# Patient Record
Sex: Male | Born: 1935 | Race: White | Hispanic: No | Marital: Married | Smoking: Former smoker
Health system: Southern US, Community
[De-identification: ages and names within clinical notes are randomized; demographics above are authoritative.]

## PROBLEM LIST (undated history)

## (undated) DIAGNOSIS — N400 Enlarged prostate without lower urinary tract symptoms: Secondary | ICD-10-CM

## (undated) DIAGNOSIS — K219 Gastro-esophageal reflux disease without esophagitis: Secondary | ICD-10-CM

## (undated) DIAGNOSIS — G20A1 Parkinson's disease without dyskinesia, without mention of fluctuations: Secondary | ICD-10-CM

## (undated) DIAGNOSIS — N189 Chronic kidney disease, unspecified: Secondary | ICD-10-CM

## (undated) DIAGNOSIS — J449 Chronic obstructive pulmonary disease, unspecified: Secondary | ICD-10-CM

## (undated) DIAGNOSIS — R42 Dizziness and giddiness: Secondary | ICD-10-CM

## (undated) DIAGNOSIS — E119 Type 2 diabetes mellitus without complications: Secondary | ICD-10-CM

## (undated) DIAGNOSIS — I219 Acute myocardial infarction, unspecified: Secondary | ICD-10-CM

## (undated) DIAGNOSIS — Z9981 Dependence on supplemental oxygen: Secondary | ICD-10-CM

## (undated) DIAGNOSIS — I10 Essential (primary) hypertension: Secondary | ICD-10-CM

## (undated) DIAGNOSIS — E039 Hypothyroidism, unspecified: Secondary | ICD-10-CM

## (undated) DIAGNOSIS — I639 Cerebral infarction, unspecified: Secondary | ICD-10-CM

## (undated) DIAGNOSIS — G20 Parkinson's disease: Secondary | ICD-10-CM

## (undated) DIAGNOSIS — I509 Heart failure, unspecified: Secondary | ICD-10-CM

## (undated) DIAGNOSIS — F419 Anxiety disorder, unspecified: Secondary | ICD-10-CM

## (undated) DIAGNOSIS — M1990 Unspecified osteoarthritis, unspecified site: Secondary | ICD-10-CM

## (undated) DIAGNOSIS — I739 Peripheral vascular disease, unspecified: Secondary | ICD-10-CM

## (undated) DIAGNOSIS — F329 Major depressive disorder, single episode, unspecified: Secondary | ICD-10-CM

## (undated) DIAGNOSIS — K5900 Constipation, unspecified: Secondary | ICD-10-CM

## (undated) DIAGNOSIS — M869 Osteomyelitis, unspecified: Secondary | ICD-10-CM

## (undated) DIAGNOSIS — F32A Depression, unspecified: Secondary | ICD-10-CM

## (undated) HISTORY — PX: THYROID SURGERY: SHX805

## (undated) HISTORY — PX: ROTATOR CUFF REPAIR: SHX139

---

## 2005-05-29 ENCOUNTER — Ambulatory Visit: Payer: Self-pay | Admitting: *Deleted

## 2005-06-17 ENCOUNTER — Ambulatory Visit: Payer: Self-pay | Admitting: *Deleted

## 2005-07-17 ENCOUNTER — Ambulatory Visit: Payer: Self-pay | Admitting: *Deleted

## 2005-08-17 ENCOUNTER — Ambulatory Visit: Payer: Self-pay | Admitting: *Deleted

## 2011-08-01 ENCOUNTER — Ambulatory Visit: Payer: Self-pay | Admitting: Family Medicine

## 2012-08-10 ENCOUNTER — Emergency Department: Payer: Self-pay | Admitting: Emergency Medicine

## 2013-02-28 ENCOUNTER — Emergency Department: Payer: Self-pay | Admitting: Internal Medicine

## 2013-02-28 LAB — COMPREHENSIVE METABOLIC PANEL
Albumin: 3.7 g/dL (ref 3.4–5.0)
Alkaline Phosphatase: 65 U/L
Chloride: 107 mmol/L (ref 98–107)
Co2: 32 mmol/L (ref 21–32)
Creatinine: 1.29 mg/dL (ref 0.60–1.30)
Glucose: 82 mg/dL (ref 65–99)
Osmolality: 286 (ref 275–301)
Potassium: 5 mmol/L (ref 3.5–5.1)
SGOT(AST): 18 U/L (ref 15–37)
Total Protein: 7.3 g/dL (ref 6.4–8.2)

## 2013-02-28 LAB — CBC WITH DIFFERENTIAL/PLATELET
Basophil #: 0.1 10*3/uL (ref 0.0–0.1)
Basophil %: 0.6 %
HCT: 45.2 % (ref 40.0–52.0)
HGB: 15.3 g/dL (ref 13.0–18.0)
Lymphocyte #: 1.8 10*3/uL (ref 1.0–3.6)
Lymphocyte %: 16.4 %
MCH: 29.4 pg (ref 26.0–34.0)
Monocyte %: 7.9 %
Neutrophil #: 8 10*3/uL — ABNORMAL HIGH (ref 1.4–6.5)
Platelet: 115 10*3/uL — ABNORMAL LOW (ref 150–440)
RDW: 13.5 % (ref 11.5–14.5)
WBC: 10.8 10*3/uL — ABNORMAL HIGH (ref 3.8–10.6)

## 2014-08-15 ENCOUNTER — Emergency Department
Admission: EM | Admit: 2014-08-15 | Discharge: 2014-08-15 | Disposition: A | Discharge status: Home / Self Care | Payer: Medicare Other | Attending: Emergency Medicine | Admitting: Emergency Medicine

## 2014-08-15 DIAGNOSIS — K5900 Constipation, unspecified: Secondary | ICD-10-CM | POA: Diagnosis not present

## 2014-08-15 MED ORDER — MAGNESIUM CITRATE PO SOLN: 1.0000 | Freq: Once | ORAL | Status: DC

## 2014-08-15 NOTE — ED Provider Notes (Signed)
[facility] Emergency Department Provider Note  ____________________________________________  Time seen: 0755  I have reviewed the triage vital signs and the nursing notes.   HISTORY  Chief Complaint Constipation   History limited by: Not Limited   HPI Jason [name] is a 81 y.o. male who presents to the emergency department today because of concerns for constipation. Patient states he has had 3 days without a bowel movement. Patient states that he feels like he needs to go but cannot. States this has happened to him in the past that usually resolves with a soapsuds enema. Family reports that the patient has been prescribed MiraLAX as well as fiber supplements but takes these irregularly. Patient describes a mild amount of additional abdominal discomfort however denies any nausea or vomiting. Denies any fevers.     No past medical history on file.  There are no active problems to display for this patient.   No past surgical history on file.  Current Outpatient Rx  Name  Route  Sig  Dispense  Refill  . magnesium citrate SOLN   Oral   Take 296 mLs (1 Bottle total) by mouth once.   195 mL   0     Allergies Review of patient's allergies indicates not on file.  No family history on file.  Social History History  Substance Use Topics  . Smoking status: Not on file  . Smokeless tobacco: Not on file  . Alcohol Use: Not on file    Review of Systems  Constitutional: Negative for fever. Cardiovascular: Negative for chest pain. Respiratory: Negative for shortness of breath. Gastrointestinal: Negative for abdominal pain, vomiting and diarrhea. Genitourinary: Negative for dysuria. Musculoskeletal: Negative for back pain. Skin: Negative for rash. Neurological: Negative for headaches, focal weakness or numbness.   10-point ROS otherwise negative.  ____________________________________________   PHYSICAL EXAM:  VITAL SIGNS: ED Triage  Vitals  Enc Vitals Group     BP 08/15/14 0659 132/47 mmHg     Pulse Rate 08/15/14 0659 101     Resp 08/15/14 0659 18     Temp 08/15/14 0659 97.9 F (36.6 C)     Temp Source 08/15/14 0659 Oral     SpO2 08/15/14 0659 98 %     Weight 08/15/14 0659 285 lb (129.275 kg)     Height 08/15/14 0659 5\' 9"  (1.753 m)     Head Cir --      Peak Flow --      Pain Score 08/15/14 0659 0   Constitutional: Alert and oriented. Well appearing and in no distress. Eyes: Conjunctivae are normal. PERRL. Normal extraocular movements. ENT   Head: Normocephalic and atraumatic.   Nose: No congestion/rhinnorhea.   Mouth/Throat: Mucous membranes are moist.   Neck: No stridor. Hematological/Lymphatic/Immunilogical: No cervical lymphadenopathy. Cardiovascular: Normal rate, regular rhythm.  No murmurs, rubs, or gallops. Respiratory: Normal respiratory effort without tachypnea nor retractions. Breath sounds are clear and equal bilaterally. No wheezes/rales/rhonchi. Gastrointestinal: Soft and nontender. No distention.  Rectal: Non tender, some stool in vault, no obvious impaction Genitourinary: Deferred Musculoskeletal: Normal range of motion in all extremities. No joint effusions.  No lower extremity tenderness nor edema. Neurologic:  Normal speech and language. No gross focal neurologic deficits are appreciated. Speech is normal.  Skin:  Skin is warm, dry and intact. No rash noted. Psychiatric: Mood and affect are normal. Speech and behavior are normal. Patient exhibits appropriate insight and judgment.  ____________________________________________    LABS (pertinent positives/negatives)  None  ____________________________________________   EKG  None  ____________________________________________    RADIOLOGY  None  ____________________________________________   PROCEDURES  Procedure(s) performed: None  Critical Care performed:  No  ____________________________________________   INITIAL IMPRESSION / ASSESSMENT AND PLAN / ED COURSE  Pertinent labs & imaging results that were available during my care of the patient were reviewed by me and considered in my medical decision making (see chart for details).  Patient here with concerns for constipation 3 days. Rectal exam without any obvious impaction. Will attempt soapsuds enema.  ----------------------------------------- 8:45 AM on 08/15/2014 -----------------------------------------  Soap suds enema has been performed with good amount of bowel discharge.Will plan on discharging patient home. Will give prescription for magnesium citrate. Have discussed with patient points to take daily MiraLAX or fiber supplements.  ____________________________________________   FINAL CLINICAL IMPRESSION(S) / ED DIAGNOSES  Final diagnoses:  Constipation, unspecified constipation type     Phineas Semen, MD 08/15/14 1610

## 2014-08-15 NOTE — ED Notes (Signed)
Pt says he has been unable to have a BM for "at least" 3 days; has been here before for the same and had to have soap suds enema; has spoken to his MD about this issue but his MD will not prescribe any medication for him; pt denies abd pain; says he needs to go and just cant

## 2014-08-15 NOTE — Discharge Instructions (Signed)
As we discussed it is important that you start taking MiraLAX daily or fiber supplements daily. It is important that you do do this daily and not just when you start developing symptoms.Please seek medical attention for any high fevers, chest pain, shortness of breath, change in behavior, persistent vomiting, bloody stool or any other new or concerning symptoms.  Constipation Constipation is when a person has fewer than three bowel movements a week, has difficulty having a bowel movement, or has stools that are dry, hard, or larger than normal. As people grow older, constipation is more common. If you try to fix constipation with medicines that make you have a bowel movement (laxatives), the problem may get worse. Long-term laxative use may cause the muscles of the colon to become weak. A low-fiber diet, not taking in enough fluids, and taking certain medicines may make constipation worse.  CAUSES   Certain medicines, such as antidepressants, pain medicine, iron supplements, antacids, and water pills.   Certain diseases, such as diabetes, irritable bowel syndrome (IBS), thyroid disease, or depression.   Not drinking enough water.   Not eating enough fiber-rich foods.   Stress or travel.   Lack of physical activity or exercise.   Ignoring the urge to have a bowel movement.   Using laxatives too much.  SIGNS AND SYMPTOMS   Having fewer than three bowel movements a week.   Straining to have a bowel movement.   Having stools that are hard, dry, or larger than normal.   Feeling full or bloated.   Pain in the lower abdomen.   Not feeling relief after having a bowel movement.  DIAGNOSIS  Your health care provider will take a medical history and perform a physical exam. Further testing may be done for severe constipation. Some tests may include:  A barium enema X-ray to examine your rectum, colon, and, sometimes, your small intestine.   A sigmoidoscopy to examine your  lower colon.   A colonoscopy to examine your entire colon. TREATMENT  Treatment will depend on the severity of your constipation and what is causing it. Some dietary treatments include drinking more fluids and eating more fiber-rich foods. Lifestyle treatments may include regular exercise. If these diet and lifestyle recommendations do not help, your health care provider may recommend taking over-the-counter laxative medicines to help you have bowel movements. Prescription medicines may be prescribed if over-the-counter medicines do not work.  HOME CARE INSTRUCTIONS   Eat foods that have a lot of fiber, such as fruits, vegetables, whole grains, and beans.  Limit foods high in fat and processed sugars, such as french fries, hamburgers, cookies, candies, and soda.   A fiber supplement may be added to your diet if you cannot get enough fiber from foods.   Drink enough fluids to keep your urine clear or pale yellow.   Exercise regularly or as directed by your health care provider.   Go to the restroom when you have the urge to go. Do not hold it.   Only take over-the-counter or prescription medicines as directed by your health care provider. Do not take other medicines for constipation without talking to your health care provider first.  SEEK IMMEDIATE MEDICAL CARE IF:   You have bright red blood in your stool.   Your constipation lasts for more than 4 days or gets worse.   You have abdominal or rectal pain.   You have thin, pencil-like stools.   You have unexplained weight loss. MAKE SURE YOU:  Understand these instructions.  Will watch your condition.  Will get help right away if you are not doing well or get worse. Document Released: 12/02/2003 Document Revised: 03/10/2013 Document Reviewed: 12/15/2012 [facility] Patient Information 2015 [place], [place]. This information is not intended to replace advice given to you by your health care provider. Make sure you discuss  any questions you have with your health care provider.

## 2014-09-04 ENCOUNTER — Emergency Department
Admission: EM | Admit: 2014-09-04 | Discharge: 2014-09-04 | Disposition: A | Discharge status: Home / Self Care | Payer: Medicare Other | Attending: Emergency Medicine | Admitting: Emergency Medicine

## 2014-09-04 ENCOUNTER — Encounter: Payer: Self-pay | Admitting: Emergency Medicine

## 2014-09-04 DIAGNOSIS — K5900 Constipation, unspecified: Secondary | ICD-10-CM | POA: Insufficient documentation

## 2014-09-04 DIAGNOSIS — Z79899 Other long term (current) drug therapy: Secondary | ICD-10-CM | POA: Diagnosis not present

## 2014-09-04 DIAGNOSIS — Z794 Long term (current) use of insulin: Secondary | ICD-10-CM | POA: Insufficient documentation

## 2014-09-04 DIAGNOSIS — Z7982 Long term (current) use of aspirin: Secondary | ICD-10-CM | POA: Diagnosis not present

## 2014-09-04 DIAGNOSIS — Z87891 Personal history of nicotine dependence: Secondary | ICD-10-CM | POA: Insufficient documentation

## 2014-09-04 DIAGNOSIS — E119 Type 2 diabetes mellitus without complications: Secondary | ICD-10-CM | POA: Diagnosis not present

## 2014-09-04 DIAGNOSIS — Z7951 Long term (current) use of inhaled steroids: Secondary | ICD-10-CM | POA: Diagnosis not present

## 2014-09-04 HISTORY — DX: Constipation, unspecified: K59.00

## 2014-09-04 HISTORY — DX: Chronic obstructive pulmonary disease, unspecified: J44.9

## 2014-09-04 HISTORY — DX: Type 2 diabetes mellitus without complications: E11.9

## 2014-09-04 MED ORDER — ONDANSETRON 4 MG PO TBDP
4.0000 mg | ORAL_TABLET | ORAL | Status: AC
  Administered 2014-09-04: 4 mg via ORAL

## 2014-09-04 MED ORDER — MAGNESIUM CITRATE PO SOLN
1.0000 | Freq: Once | ORAL | Status: AC
  Administered 2014-09-04: 1 via ORAL

## 2014-09-04 MED ORDER — MAGNESIUM CITRATE PO SOLN
ORAL | Status: AC
  Filled 2014-09-04: qty 296

## 2014-09-04 MED ORDER — ONDANSETRON 4 MG PO TBDP
ORAL_TABLET | ORAL | Status: AC
  Filled 2014-09-04: qty 1

## 2014-09-04 NOTE — ED Provider Notes (Signed)
[facility] Emergency Department Provider Note  ____________________________________________  Time seen: Approximately 3:36 PM  I have reviewed the triage vital signs and the nursing notes.   HISTORY  Chief Complaint Constipation    HPI Jason [name] is a 81 y.o. male history of constipation. Patient states that probably 5 or 6 times he has had issues with constipation. States he is here in May for the same. Takes MiraLAX twice a day, as well as stool softeners and despite this he is still develop constipation over the last 3 days. He is able to pass gas, but when he returns to have a bowel movement he feels he just can't get his bowels to move. He has a history of this. He denies being in pain. Just feels "constipated". Not a chest pain, fevers, chills. No nausea or vomiting. Continues to eat well.  Patient is requesting a soapsuds enema.   Past Medical History  Diagnosis Date  . Constipation   . Diabetes mellitus without complication   . COPD (chronic obstructive pulmonary disease)     There are no active problems to display for this patient.   Past Surgical History  Procedure Laterality Date  . Rotator cuff repair Left   . Thyroid surgery      Current Outpatient Rx  Name  Route  Sig  Dispense  Refill  . ACCU-CHEK COMPACT PLUS test strip   Topical   Apply 1 strip topically every morning.           Dispense as written.   Marland Kitchen ADVAIR DISKUS 250-50 MCG/DOSE AEPB   Inhalation   Inhale 1 puff into the lungs 2 (two) times daily.           Dispense as written.   Marland Kitchen aspirin 81 MG tablet   Oral   Take 81 mg by mouth daily.         . enalapril (VASOTEC) 20 MG tablet   Oral   Take 20 mg by mouth daily.         Marland Kitchen FLUoxetine (PROZAC) 40 MG capsule   Oral   Take 40 mg by mouth daily.         . furosemide (LASIX) 40 MG tablet   Oral   Take 80 mg by mouth every morning.         Marland Kitchen LANTUS SOLOSTAR 100 UNIT/ML Solostar Pen  Subcutaneous   Inject 90 Units into the skin 2 (two) times daily.           Dispense as written.   . magnesium citrate SOLN   Oral   Take 296 mLs (1 Bottle total) by mouth once.   195 mL   0   . omeprazole (PRILOSEC) 20 MG capsule   Oral   Take 20 mg by mouth 2 (two) times daily.         . simvastatin (ZOCOR) 20 MG tablet   Oral   Take 20 mg by mouth at bedtime.         Marland Kitchen SYNTHROID 125 MCG tablet   Oral   Take 125 mcg by mouth daily.           Dispense as written.   . tamsulosin (FLOMAX) 0.4 MG CAPS capsule   Oral   Take 0.4 mg by mouth daily.           Allergies Review of patient's allergies indicates no known allergies.  No family history on file.  Social History History  Substance  Use Topics  . Smoking status: Former Games developer  . Smokeless tobacco: Never Used  . Alcohol Use: No    Review of Systems Constitutional: No fever/chills Eyes: No visual changes. ENT: No sore throat. Cardiovascular: Denies chest pain. Respiratory: Denies shortness of breath. Gastrointestinal: No abdominal pain.  No nausea, no vomiting.  No diarrhea.  Feels quite constipated. Genitourinary: Negative for dysuria. Musculoskeletal: Negative for back pain. Skin: Negative for rash. Neurological: Negative for headaches, focal weakness or numbness.  10-point ROS otherwise negative.  ____________________________________________   PHYSICAL EXAM:  VITAL SIGNS: ED Triage Vitals  Enc Vitals Group     BP 09/04/14 1146 136/64 mmHg     Pulse Rate 09/04/14 1146 97     Resp 09/04/14 1146 18     Temp 09/04/14 1146 98 F (36.7 C)     Temp Source 09/04/14 1146 Oral     SpO2 09/04/14 1146 97 %     Weight 09/04/14 1146 285 lb (129.275 kg)     Height 09/04/14 1146 5' 9.5" (1.765 m)     Head Cir --      Peak Flow --      Pain Score 09/04/14 1147 0     Pain Loc --      Pain Edu? --      Excl. in GC? --     Constitutional: Alert and oriented. Well appearing and in no acute  distress. Eyes: Conjunctivae are normal. PERRL. EOMI. Head: Atraumatic. Nose: No congestion/rhinnorhea. Mouth/Throat: Mucous membranes are moist.  Oropharynx non-erythematous. Neck: No stridor.   Cardiovascular: Normal rate, regular rhythm. Grossly normal heart sounds.  Good peripheral circulation. Respiratory: Normal respiratory effort.  No retractions. Lungs CTAB. Gastrointestinal: Obese and somewhat protuberant. Soft and nontender. No distention. No abdominal bruits. No CVA tenderness. Musculoskeletal: No lower extremity tenderness nor edema.  No joint effusions. Neurologic:  Normal speech and language. No gross focal neurologic deficits are appreciated. Speech is normal. No gait instability. Skin:  Skin is warm, dry and intact. No rash noted. Psychiatric: Mood and affect are normal. Speech and behavior are normal.  ____________________________________________   LABS (all labs ordered are listed, but only abnormal results are displayed)  Labs Reviewed - No data to display ____________________________________________  EKG   ____________________________________________  RADIOLOGY   ____________________________________________   PROCEDURES  Procedure(s) performed: None  Critical Care performed: No  ____________________________________________   INITIAL IMPRESSION / ASSESSMENT AND PLAN / ED COURSE  Pertinent labs & imaging results that were available during my care of the patient were reviewed by me and considered in my medical decision making (see chart for details).  The patient presents concerns of constipation for the last 3 days. Does have a history of this. He is stable. He has no pain on exam. Does have a protuberant abdomen, but no other abnormalities on exam. Based on his history and previous history which she gives an examination this does seem consistent with constipation. We will treat him with a soapsuds enema and magnesium citrate and await result. Should  he not be able to pass his bowels, we can then consider obtaining imaging. At this point, however given his stability and the history he gives I do not feel the patient has clinically significant signs of bowel obstruction, or acute abdomen.  He sees a doctor at [facility] primary care whom is working with him on bowel regimens. He is currently changed to a high-fiber diet, using MiraLAX treatment only, as well as other stool softeners.  I  advised the patient and his wife that he needs to be seen by gastroenterologist. I informed him that this would be helpful, and he may need a colonoscopy should he have continuous symptoms to rule out causes such as a tumor or mass. ____________________________________________  ----------------------------------------- 6:10 PM on 09/04/2014 -----------------------------------------  Vision or a large bowel movement. Feels much improved. This point, I'll discharge the patient home with return precautions and recommendation to follow up with his primary care regarding constipation as well as GI.  Discharge to home Much improved. FINAL CLINICAL IMPRESSION(S) / ED DIAGNOSES  Final diagnoses:  Constipation, unspecified constipation type      Sharyn Creamer, MD 09/05/14 0008

## 2014-09-04 NOTE — ED Notes (Signed)
Patient to ER for constipation. Patient states he has to come to ER frequently for enemas, as Miralax does not seem to work for him. Has not had BM in 3 days.

## 2014-09-04 NOTE — Discharge Instructions (Signed)
Constipation  Return to the ER right away should you develop abdominal pain, vomiting, blood in your stool, dark stools, and unable to eat, have a fever, or other new concerns arise.  Constipation is when a person has fewer than three bowel movements a week, has difficulty having a bowel movement, or has stools that are dry, hard, or larger than normal. As people grow older, constipation is more common. If you try to fix constipation with medicines that make you have a bowel movement (laxatives), the problem may get worse. Long-term laxative use may cause the muscles of the colon to become weak. A low-fiber diet, not taking in enough fluids, and taking certain medicines may make constipation worse.  CAUSES   Certain medicines, such as antidepressants, pain medicine, iron supplements, antacids, and water pills.   Certain diseases, such as diabetes, irritable bowel syndrome (IBS), thyroid disease, or depression.   Not drinking enough water.   Not eating enough fiber-rich foods.   Stress or travel.   Lack of physical activity or exercise.   Ignoring the urge to have a bowel movement.   Using laxatives too much.  SIGNS AND SYMPTOMS   Having fewer than three bowel movements a week.   Straining to have a bowel movement.   Having stools that are hard, dry, or larger than normal.   Feeling full or bloated.   Pain in the lower abdomen.   Not feeling relief after having a bowel movement.  DIAGNOSIS  Your health care provider will take a medical history and perform a physical exam. Further testing may be done for severe constipation. Some tests may include:  A barium enema X-ray to examine your rectum, colon, and, sometimes, your small intestine.   A sigmoidoscopy to examine your lower colon.   A colonoscopy to examine your entire colon. TREATMENT  Treatment will depend on the severity of your constipation and what is causing it. Some dietary treatments include  drinking more fluids and eating more fiber-rich foods. Lifestyle treatments may include regular exercise. If these diet and lifestyle recommendations do not help, your health care provider may recommend taking over-the-counter laxative medicines to help you have bowel movements. Prescription medicines may be prescribed if over-the-counter medicines do not work.  HOME CARE INSTRUCTIONS   Eat foods that have a lot of fiber, such as fruits, vegetables, whole grains, and beans.  Limit foods high in fat and processed sugars, such as french fries, hamburgers, cookies, candies, and soda.   A fiber supplement may be added to your diet if you cannot get enough fiber from foods.   Drink enough fluids to keep your urine clear or pale yellow.   Exercise regularly or as directed by your health care provider.   Go to the restroom when you have the urge to go. Do not hold it.   Only take over-the-counter or prescription medicines as directed by your health care provider. Do not take other medicines for constipation without talking to your health care provider first.  SEEK IMMEDIATE MEDICAL CARE IF:   You have bright red blood in your stool.   Your constipation lasts for more than 4 days or gets worse.   You have abdominal or rectal pain.   You have thin, pencil-like stools.   You have unexplained weight loss. MAKE SURE YOU:   Understand these instructions.  Will watch your condition.  Will get help right away if you are not doing well or get worse. Document Released: 12/02/2003 Document Revised:  03/10/2013 Document Reviewed: 12/15/2012 [employer] Patient Information 2015 [place], [place]. This information is not intended to replace advice given to you by your health care provider. Make sure you discuss any questions you have with your health care provider.

## 2015-01-14 ENCOUNTER — Other Ambulatory Visit: Payer: Self-pay | Admitting: Cardiology

## 2015-01-14 DIAGNOSIS — R1310 Dysphagia, unspecified: Secondary | ICD-10-CM

## 2015-10-12 ENCOUNTER — Emergency Department: Payer: Medicare Other

## 2015-10-12 ENCOUNTER — Observation Stay
Admission: EM | Admit: 2015-10-12 | Discharge: 2015-10-15 | Disposition: A | Discharge status: Home Health | Payer: Medicare Other | Attending: Internal Medicine | Admitting: Internal Medicine

## 2015-10-12 DIAGNOSIS — Z7982 Long term (current) use of aspirin: Secondary | ICD-10-CM | POA: Insufficient documentation

## 2015-10-12 DIAGNOSIS — S0083XA Contusion of other part of head, initial encounter: Secondary | ICD-10-CM | POA: Insufficient documentation

## 2015-10-12 DIAGNOSIS — S42222A 2-part displaced fracture of surgical neck of left humerus, initial encounter for closed fracture: Principal | ICD-10-CM | POA: Insufficient documentation

## 2015-10-12 DIAGNOSIS — Z8349 Family history of other endocrine, nutritional and metabolic diseases: Secondary | ICD-10-CM | POA: Diagnosis not present

## 2015-10-12 DIAGNOSIS — Y92009 Unspecified place in unspecified non-institutional (private) residence as the place of occurrence of the external cause: Secondary | ICD-10-CM

## 2015-10-12 DIAGNOSIS — W19XXXA Unspecified fall, initial encounter: Secondary | ICD-10-CM | POA: Insufficient documentation

## 2015-10-12 DIAGNOSIS — E785 Hyperlipidemia, unspecified: Secondary | ICD-10-CM | POA: Diagnosis not present

## 2015-10-12 DIAGNOSIS — S91119A Laceration without foreign body of unspecified toe without damage to nail, initial encounter: Secondary | ICD-10-CM

## 2015-10-12 DIAGNOSIS — S91116A Laceration without foreign body of unspecified lesser toe(s) without damage to nail, initial encounter: Secondary | ICD-10-CM | POA: Insufficient documentation

## 2015-10-12 DIAGNOSIS — I10 Essential (primary) hypertension: Secondary | ICD-10-CM | POA: Diagnosis not present

## 2015-10-12 DIAGNOSIS — Z6841 Body Mass Index (BMI) 40.0 and over, adult: Secondary | ICD-10-CM | POA: Insufficient documentation

## 2015-10-12 DIAGNOSIS — Z800 Family history of malignant neoplasm of digestive organs: Secondary | ICD-10-CM | POA: Insufficient documentation

## 2015-10-12 DIAGNOSIS — Y939 Activity, unspecified: Secondary | ICD-10-CM | POA: Insufficient documentation

## 2015-10-12 DIAGNOSIS — N400 Enlarged prostate without lower urinary tract symptoms: Secondary | ICD-10-CM | POA: Diagnosis present

## 2015-10-12 DIAGNOSIS — L8990 Pressure ulcer of unspecified site, unspecified stage: Secondary | ICD-10-CM | POA: Diagnosis present

## 2015-10-12 DIAGNOSIS — E119 Type 2 diabetes mellitus without complications: Secondary | ICD-10-CM

## 2015-10-12 DIAGNOSIS — Z79899 Other long term (current) drug therapy: Secondary | ICD-10-CM | POA: Insufficient documentation

## 2015-10-12 DIAGNOSIS — M25552 Pain in left hip: Secondary | ICD-10-CM

## 2015-10-12 DIAGNOSIS — S92502B Displaced unspecified fracture of left lesser toe(s), initial encounter for open fracture: Secondary | ICD-10-CM | POA: Insufficient documentation

## 2015-10-12 DIAGNOSIS — Z833 Family history of diabetes mellitus: Secondary | ICD-10-CM | POA: Diagnosis not present

## 2015-10-12 DIAGNOSIS — Z87891 Personal history of nicotine dependence: Secondary | ICD-10-CM | POA: Insufficient documentation

## 2015-10-12 DIAGNOSIS — J449 Chronic obstructive pulmonary disease, unspecified: Secondary | ICD-10-CM | POA: Diagnosis not present

## 2015-10-12 DIAGNOSIS — S91115A Laceration without foreign body of left lesser toe(s) without damage to nail, initial encounter: Secondary | ICD-10-CM | POA: Diagnosis present

## 2015-10-12 DIAGNOSIS — E1140 Type 2 diabetes mellitus with diabetic neuropathy, unspecified: Secondary | ICD-10-CM | POA: Insufficient documentation

## 2015-10-12 DIAGNOSIS — S0083XS Contusion of other part of head, sequela: Secondary | ICD-10-CM | POA: Insufficient documentation

## 2015-10-12 DIAGNOSIS — S92901B Unspecified fracture of right foot, initial encounter for open fracture: Secondary | ICD-10-CM

## 2015-10-12 DIAGNOSIS — E039 Hypothyroidism, unspecified: Secondary | ICD-10-CM | POA: Diagnosis not present

## 2015-10-12 DIAGNOSIS — S42213A Unspecified displaced fracture of surgical neck of unspecified humerus, initial encounter for closed fracture: Secondary | ICD-10-CM | POA: Diagnosis present

## 2015-10-12 DIAGNOSIS — K219 Gastro-esophageal reflux disease without esophagitis: Secondary | ICD-10-CM | POA: Diagnosis not present

## 2015-10-12 DIAGNOSIS — Z794 Long term (current) use of insulin: Secondary | ICD-10-CM | POA: Insufficient documentation

## 2015-10-12 DIAGNOSIS — D696 Thrombocytopenia, unspecified: Secondary | ICD-10-CM | POA: Insufficient documentation

## 2015-10-12 DIAGNOSIS — S92919B Unspecified fracture of unspecified toe(s), initial encounter for open fracture: Secondary | ICD-10-CM | POA: Diagnosis present

## 2015-10-12 DIAGNOSIS — W19XXXS Unspecified fall, sequela: Secondary | ICD-10-CM | POA: Diagnosis not present

## 2015-10-12 DIAGNOSIS — I69351 Hemiplegia and hemiparesis following cerebral infarction affecting right dominant side: Secondary | ICD-10-CM | POA: Insufficient documentation

## 2015-10-12 DIAGNOSIS — S42302A Unspecified fracture of shaft of humerus, left arm, initial encounter for closed fracture: Secondary | ICD-10-CM

## 2015-10-12 HISTORY — DX: Hypothyroidism, unspecified: E03.9

## 2015-10-12 HISTORY — DX: Cerebral infarction, unspecified: I63.9

## 2015-10-12 HISTORY — DX: Gastro-esophageal reflux disease without esophagitis: K21.9

## 2015-10-12 HISTORY — DX: Benign prostatic hyperplasia without lower urinary tract symptoms: N40.0

## 2015-10-12 HISTORY — DX: Essential (primary) hypertension: I10

## 2015-10-12 LAB — COMPREHENSIVE METABOLIC PANEL
ALBUMIN: 3.4 g/dL — AB (ref 3.5–5.0)
ALT: 17 U/L (ref 17–63)
ANION GAP: 10 (ref 5–15)
AST: 23 U/L (ref 15–41)
Alkaline Phosphatase: 61 U/L (ref 38–126)
BUN: 18 mg/dL (ref 6–20)
CHLORIDE: 103 mmol/L (ref 101–111)
CO2: 27 mmol/L (ref 22–32)
Calcium: 8.5 mg/dL — ABNORMAL LOW (ref 8.9–10.3)
Creatinine, Ser: 1.16 mg/dL (ref 0.61–1.24)
GFR calc Af Amer: >60 mL/min (ref >60)
GFR calc non Af Amer: 57 mL/min — ABNORMAL LOW (ref >60)
Glucose, Bld: 225 mg/dL — ABNORMAL HIGH (ref 65–99)
POTASSIUM: 4 mmol/L (ref 3.5–5.1)
SODIUM: 140 mmol/L (ref 135–145)
Total Bilirubin: 0.6 mg/dL (ref 0.3–1.2)
Total Protein: 6.5 g/dL (ref 6.5–8.1)

## 2015-10-12 LAB — URINALYSIS COMPLETE WITH MICROSCOPIC (ARMC ONLY)
Bacteria, UA: NONE SEEN
Bilirubin Urine: NEGATIVE
Glucose, UA: >500 mg/dL — AB
Leukocytes, UA: NEGATIVE
Nitrite: NEGATIVE
PROTEIN: NEGATIVE mg/dL
SPECIFIC GRAVITY, URINE: 1.015 (ref 1.005–1.030)
WBC UA: NONE SEEN WBC/hpf (ref 0–5)
pH: 5 (ref 5.0–8.0)

## 2015-10-12 LAB — CBC
HCT: 43 % (ref 40.0–52.0)
HEMOGLOBIN: 14.5 g/dL (ref 13.0–18.0)
MCH: 28.8 pg (ref 26.0–34.0)
MCHC: 33.7 g/dL (ref 32.0–36.0)
MCV: 85.4 fL (ref 80.0–100.0)
Platelets: 98 10*3/uL — ABNORMAL LOW (ref 150–440)
RBC: 5.04 MIL/uL (ref 4.40–5.90)
RDW: 13.4 % (ref 11.5–14.5)
WBC: 7.5 10*3/uL (ref 3.8–10.6)

## 2015-10-12 LAB — TROPONIN I

## 2015-10-12 MED ORDER — CEFAZOLIN IN D5W 1 GM/50ML IV SOLN
1.0000 g | Freq: Once | INTRAVENOUS | Status: AC
  Administered 2015-10-13: 1 g via INTRAVENOUS
  Filled 2015-10-12: qty 50

## 2015-10-12 MED ORDER — IBUPROFEN 600 MG PO TABS
600.0000 mg | ORAL_TABLET | Freq: Once | ORAL | Status: AC
Start: 2015-10-12 — End: 2015-10-12
  Administered 2015-10-12: 600 mg via ORAL

## 2015-10-12 MED ORDER — IBUPROFEN 600 MG PO TABS
ORAL_TABLET | ORAL | Status: AC
  Administered 2015-10-12: 600 mg via ORAL
  Filled 2015-10-12: qty 1

## 2015-10-12 MED ORDER — MORPHINE SULFATE (PF) 4 MG/ML IV SOLN
4.0000 mg | Freq: Once | INTRAVENOUS | Status: AC
  Administered 2015-10-12: 4 mg via INTRAVENOUS

## 2015-10-12 MED ORDER — MORPHINE SULFATE (PF) 4 MG/ML IV SOLN
INTRAVENOUS | Status: AC
  Administered 2015-10-12: 4 mg via INTRAVENOUS
  Filled 2015-10-12: qty 1

## 2015-10-12 MED ORDER — ONDANSETRON HCL 4 MG/2ML IJ SOLN
4.0000 mg | Freq: Once | INTRAMUSCULAR | Status: AC
  Administered 2015-10-12: 4 mg via INTRAVENOUS

## 2015-10-12 MED ORDER — OXYCODONE-ACETAMINOPHEN 5-325 MG PO TABS
1.0000 | ORAL_TABLET | Freq: Once | ORAL | Status: AC
  Administered 2015-10-12: 1 via ORAL

## 2015-10-12 MED ORDER — OXYCODONE-ACETAMINOPHEN 5-325 MG PO TABS
ORAL_TABLET | ORAL | Status: AC
  Administered 2015-10-12: 1 via ORAL
  Filled 2015-10-12: qty 1

## 2015-10-12 NOTE — ED Notes (Signed)
Pt returned to ED Rm 8 from CT.

## 2015-10-12 NOTE — ED Triage Notes (Signed)
Pt arrived by [facility].  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 Pt has bleeding and deformity to L toes and fell onto L shoulder.  Reports that fall was a mechanical fall.  Pt nauseated and vomiting upon arrival.  Pt lives with son and wife at home.  Pt has frequent falls with many skin tears and bruising scattered on bilateral legs.

## 2015-10-12 NOTE — ED Provider Notes (Signed)
[facility] Emergency Department Provider Note ____________________________________________  Time seen: I have reviewed the triage vital signs and the triage nursing note.  HISTORY  Chief Complaint Fall   Historian Patient and spouse and son  HPI Jason [name] is a 82 y.o. male with a history of stroke with right-sided weakness, who has difficulty getting around on his own, uses a walker and a left, is here after mechanical fall. Wife was watching and his feet got sort of twisted up and he fell onto the left side. He stubbed his left toes and has some bleeding there. Not much pain there as he does have diabetic neuropathy. He does have pain at his left shoulder. No head injury from today. No neck injury or or neck pain. No chest pain.  He did have a fall several days ago and struck his head and has an ecchymosis over his left forehead.    Past Medical History:  Diagnosis Date  . Constipation   . COPD (chronic obstructive pulmonary disease) (HCC)   . Diabetes mellitus without complication (HCC)   . Stroke [facility])     There are no active problems to display for this patient.   Past Surgical History:  Procedure Laterality Date  . ROTATOR CUFF REPAIR Left   . THYROID SURGERY      Prior to Admission medications   Medication Sig Start Date End Date Taking? Authorizing Provider  ADVAIR DISKUS 250-50 MCG/DOSE AEPB Inhale 1 puff into the lungs 2 (two) times daily. 06/09/14  Yes Historical Provider, MD  aspirin 81 MG tablet Take 81 mg by mouth daily.   Yes Historical Provider, MD  enalapril (VASOTEC) 20 MG tablet Take 20 mg by mouth daily. 07/15/14  Yes Historical Provider, MD  FLUoxetine (PROZAC) 40 MG capsule Take 40 mg by mouth daily.   Yes Historical Provider, MD  furosemide (LASIX) 40 MG tablet Take 80 mg by mouth every morning.   Yes Historical Provider, MD  LANTUS SOLOSTAR 100 UNIT/ML Solostar Pen Inject 90 Units into the skin 2 (two) times daily. 06/15/14   Yes Historical Provider, MD  omeprazole (PRILOSEC) 20 MG capsule Take 20 mg by mouth 2 (two) times daily. 06/15/14  Yes Historical Provider, MD  simvastatin (ZOCOR) 20 MG tablet Take 20 mg by mouth at bedtime. 07/08/14  Yes Historical Provider, MD  SYNTHROID 125 MCG tablet Take 125 mcg by mouth daily. 07/24/14  Yes Historical Provider, MD  tamsulosin (FLOMAX) 0.4 MG CAPS capsule Take 0.4 mg by mouth daily.   Yes Historical Provider, MD  ACCU-CHEK COMPACT PLUS test strip Apply 1 strip topically every morning. 05/29/14   Historical Provider, MD    No Known Allergies  No family history on file.  Social History Social History  Substance Use Topics  . Smoking status: Former Games developer  . Smokeless tobacco: Never Used  . Alcohol use No    Review of Systems  Constitutional: Negative for fever. Eyes: Negative for visual changes. ENT: Negative for sore throat. Cardiovascular: Negative for chest pain. Respiratory: Negative for shortness of breath. Gastrointestinal: Negative for abdominal pain, vomiting and diarrhea. Genitourinary: Negative for dysuria. Musculoskeletal: Negative for back pain. Skin: Negative for rash. Neurological: Negative for headache. 10 point Review of Systems otherwise negative ____________________________________________   PHYSICAL EXAM:  VITAL SIGNS: ED Triage Vitals  Enc Vitals Group     BP 10/12/15 1738 (!) 151/69     Pulse Rate 10/12/15 1738 97     Resp 10/12/15 1738 11  Temp 10/12/15 1738 97.9 F (36.6 C)     Temp Source 10/12/15 1738 Oral     SpO2 10/12/15 1738 96 %     Weight --      Height --      Head Circumference --      Peak Flow --      Pain Score 10/12/15 1731 9     Pain Loc --      Pain Edu? --      Excl. in GC? --      Constitutional: Alert and oriented. Well appearing and in no distress. HEENT   Head: Normocephalic.  Some old appearing ecchymosis left forehead.      Eyes: Conjunctivae are normal. PERRL. Normal extraocular  movements.      Ears:         Nose: No congestion/rhinnorhea.   Mouth/Throat: Mucous membranes are moist.   Neck: No stridor. C-spine nontender to palpation and range of motion. Cardiovascular/Chest: Normal rate, regular rhythm.  No murmurs, rubs, or gallops. Respiratory: Normal respiratory effort without tachypnea nor retractions. Breath sounds are clear and equal bilaterally. No wheezes/rales/rhonchi. Gastrointestinal: Soft. No distention, no guarding, no rebound. Nontender.  Obese  Genitourinary/rectal:Deferred Musculoskeletal: Nontender with normal range of motion in all extremities. No joint effusions.  No lower extremity tenderness.  No edema.  Crusted blood to the left fourth and fifth web space, after cleaned up -- open fracture dislocation of the left fourth toe with laceration along the distal toe crease. Abrasion/superficial laceration in the webspace between the fourth and fifth digits. Laceration in the distal crease of the third left toe Neurologic:  Normal speech and language. No gross or focal neurologic deficits are appreciated. Skin:  Skin is warm, dry and intact. No rash noted. Psychiatric: Mood and affect are normal. Speech and behavior are normal. Patient exhibits appropriate insight and judgment.  ____________________________________________   EKG I, Governor Rooks, MD, the attending physician have personally viewed and interpreted all ECGs.  97 bpm. Normal sinus rhythm with occasional paced beat. Nonspecific intraventricular conduction delay. Normal axis. Nonspecific ST and T-wave ____________________________________________  LABS (pertinent positives/negatives)  Labs Reviewed  COMPREHENSIVE METABOLIC PANEL - Abnormal; Notable for the following:       Result Value   Glucose, Bld 225 (*)    Calcium 8.5 (*)    Albumin 3.4 (*)    GFR calc non Af Amer 57 (*)    All other components within normal limits  CBC - Abnormal; Notable for the following:    Platelets 98  (*)    All other components within normal limits  URINALYSIS COMPLETEWITH MICROSCOPIC ([facility] ONLY) - Abnormal; Notable for the following:    Color, Urine YELLOW (*)    APPearance CLEAR (*)    Glucose, UA >500 (*)    Ketones, ur TRACE (*)    Hgb urine dipstick 1+ (*)    Squamous Epithelial / LPF 0-5 (*)    All other components within normal limits  TROPONIN I    ____________________________________________  RADIOLOGY All Xrays were viewed by me. Imaging interpreted by Radiologist.  Ct head noncontrast:  IMPRESSION: No acute intracranial hemorrhage. Mild age-related atrophy and chronic microvascular ischemic disease. Electronically Signed   By: Elgie Collard M.D.  Left foot complete: No fracture or dislocation is seen *My view, left 4th proximal phalynx fracture   Shoulder left: Mildly impacted surgical neck fracture left humerus. __________________________________________  PROCEDURES  Procedure(s) performed:    1.. Dislocation reduction of fracture  of left 4th toe - Performed by Dr. Shaune Pollack.  Manual reduction into alignment clinically.     2.  LACERATION REPAIR Performed by: Governor Rooks Authorized by: Governor Rooks Consent: Verbal consent obtained. Risks and benefits: risks, benefits and alternatives were discussed Consent given by: patient Patient identity confirmed: provided demographic data Prepped and Draped in normal sterile fashion Wound explored  Laceration Location: 3rd and 4th left toes, in crease/fold  Laceration Length: 2 cm  No Foreign Bodies seen or palpated  Irrigation method: syringe Amount of cleaning: standard  Skin closure: 4-0 prolene  Number of sutures: 2  Technique: interrupted  Patient tolerance: Patient tolerated the procedure well with no immediate complications.  Critical Care performed: None  ____________________________________________   ED COURSE / ASSESSMENT AND PLAN  Pertinent labs & imaging results that were  available during my care of the patient were reviewed by me and considered in my medical decision making (see chart for details).  This patient had what sounds like was most likely mechanical fall, however I did discuss obtaining studies for any medical complications that could've made him more off balance.  From a traumatic perspective, no suspected neck chest or abdominal injuries.  Head CT was obtained given the fall several days ago to ensure there is no intracranial bleed at that point time period and this was negative.  History of x-rayed did show a left humerus fracture, patient was placed in a sling.   X-ray foot read as negative, but after toe cleaned up, he has an open fracture dislocation of the left fourth phalanx. He has a laceration also of the third toe pulp as well.  Lacerations repaired and placed in post op shoe.  One interrupted stitch in each of the toe crease of the third and fourth toes to make sure that that area does not open up again.  This patient has trouble getting around on his own at baseline using a lift and a walker. I'm quite concerned about his ability to get around on his own now that he also has nonweightbearing to his left upper extremity and laceration/open fracture toe on the left and the right sided weakness from prior stroke. I spoke initially with the social worker from [facility] to consider outpatient home health services, however this patient cannot go home in this condition.  I will admit him overnight for observation and pain control and orthopedic consultation.   CONSULTATIONS:   I spoke with the social worker on call from [facility].  Hospitalist, Dr. Anne Hahn,  for admission.  Orthopedics, Dr. Hyacinth Meeker -.   Patient / Family / Caregiver informed of clinical course, medical decision-making process, and agree with plan.  ___________________________________________   FINAL CLINICAL IMPRESSION(S) / ED DIAGNOSES   Final diagnoses:  Laceration of  fourth toe of left foot, initial encounter  Humerus fracture, left, closed, initial encounter  Facial contusion, initial encounter  Fall at home, initial encounter  Fracture of foot, open, right, initial encounter              Note: This dictation was prepared with Nurse, children's dictation. Any transcriptional errors that result from this process are unintentional    Governor Rooks, MD 10/13/15 [phone]

## 2015-10-13 ENCOUNTER — Observation Stay: Payer: Medicare Other

## 2015-10-13 ENCOUNTER — Encounter: Payer: Self-pay | Admitting: Internal Medicine

## 2015-10-13 DIAGNOSIS — N400 Enlarged prostate without lower urinary tract symptoms: Secondary | ICD-10-CM | POA: Diagnosis present

## 2015-10-13 DIAGNOSIS — S42213A Unspecified displaced fracture of surgical neck of unspecified humerus, initial encounter for closed fracture: Secondary | ICD-10-CM | POA: Diagnosis present

## 2015-10-13 DIAGNOSIS — J449 Chronic obstructive pulmonary disease, unspecified: Secondary | ICD-10-CM | POA: Diagnosis present

## 2015-10-13 DIAGNOSIS — S92919B Unspecified fracture of unspecified toe(s), initial encounter for open fracture: Secondary | ICD-10-CM | POA: Diagnosis present

## 2015-10-13 DIAGNOSIS — E039 Hypothyroidism, unspecified: Secondary | ICD-10-CM | POA: Diagnosis present

## 2015-10-13 DIAGNOSIS — L8990 Pressure ulcer of unspecified site, unspecified stage: Secondary | ICD-10-CM | POA: Diagnosis present

## 2015-10-13 DIAGNOSIS — E119 Type 2 diabetes mellitus without complications: Secondary | ICD-10-CM

## 2015-10-13 DIAGNOSIS — S42222A 2-part displaced fracture of surgical neck of left humerus, initial encounter for closed fracture: Secondary | ICD-10-CM | POA: Diagnosis not present

## 2015-10-13 DIAGNOSIS — K219 Gastro-esophageal reflux disease without esophagitis: Secondary | ICD-10-CM | POA: Diagnosis present

## 2015-10-13 LAB — BASIC METABOLIC PANEL
Anion gap: 9 (ref 5–15)
BUN: 22 mg/dL — AB (ref 6–20)
CO2: 26 mmol/L (ref 22–32)
CREATININE: 1.15 mg/dL (ref 0.61–1.24)
Calcium: 8 mg/dL — ABNORMAL LOW (ref 8.9–10.3)
Chloride: 102 mmol/L (ref 101–111)
GFR calc Af Amer: >60 mL/min (ref >60)
GFR, EST NON AFRICAN AMERICAN: 58 mL/min — AB (ref >60)
Glucose, Bld: 319 mg/dL — ABNORMAL HIGH (ref 65–99)
Potassium: 4.1 mmol/L (ref 3.5–5.1)
SODIUM: 137 mmol/L (ref 135–145)

## 2015-10-13 LAB — GLUCOSE, CAPILLARY
GLUCOSE-CAPILLARY: 258 mg/dL — AB (ref 65–99)
GLUCOSE-CAPILLARY: 277 mg/dL — AB (ref 65–99)
GLUCOSE-CAPILLARY: 341 mg/dL — AB (ref 65–99)
GLUCOSE-CAPILLARY: 353 mg/dL — AB (ref 65–99)
Glucose-Capillary: 248 mg/dL — ABNORMAL HIGH (ref 65–99)
Glucose-Capillary: 303 mg/dL — ABNORMAL HIGH (ref 65–99)
Glucose-Capillary: 349 mg/dL — ABNORMAL HIGH (ref 65–99)

## 2015-10-13 LAB — CBC
HCT: 38.8 % — ABNORMAL LOW (ref 40.0–52.0)
Hemoglobin: 13.1 g/dL (ref 13.0–18.0)
MCH: 28.8 pg (ref 26.0–34.0)
MCHC: 33.7 g/dL (ref 32.0–36.0)
MCV: 85.3 fL (ref 80.0–100.0)
PLATELETS: 92 10*3/uL — AB (ref 150–440)
RBC: 4.55 MIL/uL (ref 4.40–5.90)
RDW: 13.5 % (ref 11.5–14.5)
WBC: 8 10*3/uL (ref 3.8–10.6)

## 2015-10-13 LAB — HEMOGLOBIN A1C: Hgb A1c MFr Bld: 7.8 % — ABNORMAL HIGH (ref 4.0–6.0)

## 2015-10-13 MED ORDER — HEPARIN SODIUM (PORCINE) 5000 UNIT/ML IJ SOLN
5000.0000 [IU] | Freq: Three times a day (TID) | INTRAMUSCULAR | Status: DC
  Administered 2015-10-13 – 2015-10-15 (×7): 5000 [IU] via SUBCUTANEOUS
  Filled 2015-10-13 (×7): qty 1

## 2015-10-13 MED ORDER — ONDANSETRON HCL 4 MG/2ML IJ SOLN
4.0000 mg | Freq: Four times a day (QID) | INTRAMUSCULAR | Status: DC | PRN
  Administered 2015-10-13: 4 mg via INTRAVENOUS
  Filled 2015-10-13: qty 2

## 2015-10-13 MED ORDER — SODIUM CHLORIDE 0.9 % IV BOLUS (SEPSIS)
500.0000 mL | Freq: Once | INTRAVENOUS | Status: AC
  Administered 2015-10-13: 500 mL via INTRAVENOUS

## 2015-10-13 MED ORDER — FLUOXETINE HCL 20 MG PO CAPS
40.0000 mg | ORAL_CAPSULE | Freq: Every day | ORAL | Status: DC
  Administered 2015-10-13 – 2015-10-15 (×3): 40 mg via ORAL
  Filled 2015-10-13 (×3): qty 2

## 2015-10-13 MED ORDER — SODIUM CHLORIDE 0.9 % IV SOLN
INTRAVENOUS | Status: DC
  Administered 2015-10-13: 02:00:00 via INTRAVENOUS

## 2015-10-13 MED ORDER — MORPHINE SULFATE (PF) 4 MG/ML IV SOLN
INTRAVENOUS | Status: AC
  Administered 2015-10-13: 4 mg via INTRAVENOUS
  Filled 2015-10-13: qty 1

## 2015-10-13 MED ORDER — PANTOPRAZOLE SODIUM 40 MG PO TBEC
40.0000 mg | DELAYED_RELEASE_TABLET | Freq: Two times a day (BID) | ORAL | Status: DC
  Administered 2015-10-13 – 2015-10-15 (×6): 40 mg via ORAL
  Filled 2015-10-13 (×6): qty 1

## 2015-10-13 MED ORDER — ASPIRIN 81 MG PO CHEW
81.0000 mg | CHEWABLE_TABLET | Freq: Every day | ORAL | Status: DC
  Administered 2015-10-13 – 2015-10-15 (×3): 81 mg via ORAL
  Filled 2015-10-13 (×3): qty 1

## 2015-10-13 MED ORDER — TAMSULOSIN HCL 0.4 MG PO CAPS
0.4000 mg | ORAL_CAPSULE | Freq: Every day | ORAL | Status: DC
  Administered 2015-10-13 – 2015-10-15 (×3): 0.4 mg via ORAL
  Filled 2015-10-13 (×3): qty 1

## 2015-10-13 MED ORDER — INSULIN GLARGINE 100 UNIT/ML ~~LOC~~ SOLN: 90.0000 [IU] | Freq: Once | SUBCUTANEOUS | Status: DC

## 2015-10-13 MED ORDER — ACETAMINOPHEN 650 MG RE SUPP: 650.0000 mg | Freq: Four times a day (QID) | RECTAL | Status: DC | PRN

## 2015-10-13 MED ORDER — ONDANSETRON HCL 4 MG PO TABS
4.0000 mg | ORAL_TABLET | Freq: Four times a day (QID) | ORAL | Status: DC | PRN
Start: 2015-10-13 — End: 2015-10-15

## 2015-10-13 MED ORDER — SODIUM CHLORIDE 0.9% FLUSH
3.0000 mL | Freq: Two times a day (BID) | INTRAVENOUS | Status: DC
  Administered 2015-10-14 – 2015-10-15 (×3): 3 mL via INTRAVENOUS

## 2015-10-13 MED ORDER — INSULIN ASPART 100 UNIT/ML ~~LOC~~ SOLN
0.0000 [IU] | Freq: Four times a day (QID) | SUBCUTANEOUS | Status: DC
  Administered 2015-10-13: 3 [IU] via SUBCUTANEOUS
  Administered 2015-10-13: 7 [IU] via SUBCUTANEOUS
  Filled 2015-10-13: qty 3
  Filled 2015-10-13: qty 7

## 2015-10-13 MED ORDER — LEVOTHYROXINE SODIUM 75 MCG PO TABS
125.0000 ug | ORAL_TABLET | Freq: Every day | ORAL | Status: DC
  Administered 2015-10-13 – 2015-10-14 (×2): 125 ug via ORAL
  Filled 2015-10-13 (×3): qty 1

## 2015-10-13 MED ORDER — SIMVASTATIN 20 MG PO TABS
20.0000 mg | ORAL_TABLET | Freq: Every day | ORAL | Status: DC
  Administered 2015-10-13 – 2015-10-14 (×3): 20 mg via ORAL
  Filled 2015-10-13 (×3): qty 1

## 2015-10-13 MED ORDER — INSULIN GLARGINE 100 UNIT/ML ~~LOC~~ SOLN
45.0000 [IU] | Freq: Two times a day (BID) | SUBCUTANEOUS | Status: DC
  Administered 2015-10-13: 45 [IU] via SUBCUTANEOUS
  Filled 2015-10-13 (×2): qty 0.45

## 2015-10-13 MED ORDER — AMOXICILLIN-POT CLAVULANATE 875-125 MG PO TABS: 1.0000 | ORAL_TABLET | Freq: Two times a day (BID) | ORAL | Status: DC

## 2015-10-13 MED ORDER — MORPHINE SULFATE (PF) 4 MG/ML IV SOLN
4.0000 mg | INTRAVENOUS | Status: DC | PRN
  Administered 2015-10-13: 4 mg via INTRAVENOUS

## 2015-10-13 MED ORDER — INSULIN ASPART 100 UNIT/ML ~~LOC~~ SOLN
0.0000 [IU] | Freq: Every day | SUBCUTANEOUS | Status: DC
  Administered 2015-10-13: 5 [IU] via SUBCUTANEOUS
  Administered 2015-10-14: 4 [IU] via SUBCUTANEOUS
  Filled 2015-10-13: qty 5
  Filled 2015-10-13: qty 4

## 2015-10-13 MED ORDER — OXYCODONE HCL 5 MG PO TABS
5.0000 mg | ORAL_TABLET | ORAL | Status: DC | PRN
  Administered 2015-10-13 – 2015-10-15 (×6): 5 mg via ORAL
  Filled 2015-10-13 (×6): qty 1

## 2015-10-13 MED ORDER — AMOXICILLIN-POT CLAVULANATE 875-125 MG PO TABS
1.0000 | ORAL_TABLET | Freq: Two times a day (BID) | ORAL | Status: DC
  Administered 2015-10-13 (×2): 1 via ORAL
  Filled 2015-10-13 (×2): qty 1

## 2015-10-13 MED ORDER — ACETAMINOPHEN 325 MG PO TABS
650.0000 mg | ORAL_TABLET | Freq: Four times a day (QID) | ORAL | Status: DC | PRN
  Administered 2015-10-15: 650 mg via ORAL
  Filled 2015-10-13: qty 2

## 2015-10-13 MED ORDER — INSULIN ASPART 100 UNIT/ML ~~LOC~~ SOLN
0.0000 [IU] | Freq: Three times a day (TID) | SUBCUTANEOUS | Status: DC
  Administered 2015-10-13 – 2015-10-15 (×5): 7 [IU] via SUBCUTANEOUS
  Administered 2015-10-15: 5 [IU] via SUBCUTANEOUS
  Filled 2015-10-13 (×3): qty 7
  Filled 2015-10-13: qty 5
  Filled 2015-10-13 (×2): qty 7

## 2015-10-13 MED ORDER — AMOXICILLIN-POT CLAVULANATE 875-125 MG PO TABS
1.0000 | ORAL_TABLET | Freq: Two times a day (BID) | ORAL | Status: DC
  Administered 2015-10-14 – 2015-10-15 (×3): 1 via ORAL
  Filled 2015-10-13 (×3): qty 1

## 2015-10-13 MED ORDER — ENALAPRIL MALEATE 10 MG PO TABS
20.0000 mg | ORAL_TABLET | Freq: Every day | ORAL | Status: DC
  Administered 2015-10-13: 20 mg via ORAL
  Filled 2015-10-13: qty 2

## 2015-10-13 NOTE — Progress Notes (Signed)
PT Cancellation Note  Patient Details Name: Jason [name] MRN: [id number] DOB: 04/19/1932   Cancelled Treatment:    Reason Eval/Treat Not Completed: Medical issues which prohibited therapy;Patient not medically ready. Pt's chart reviewed. Pt is currently awaiting an orthopedic consultation and is not appropriate to participate in therapy at this time. PT will await orthopedic consult and f/u and a later time to complete evaluation.   Adelene Idler, PT, DPT  10/13/15, 12:43 PM [phone]

## 2015-10-13 NOTE — Consult Note (Signed)
Patient is seen for evaluation of a left proximal humerus fracture. He has had 2 falls in the last 2 weeks and yesterday was found to have a left shoulder fracture as well as plateau fracture which Dr. Alberteen Spindle kindly saw earlier today. I was contacted by physical therapy to let me know about his shoulder fracture as well and went to weightbearing status is. The patient reports that he has had strokes and is minimally ambulatory at home. He uses a walker normally. He is having left shoulder pain after falling onto his left side. He says he normally cannot get his arm over his head but he gets up about half way.  On exam he is currently in a sling there is no significant ecchymosis and skin is intact around the left shoulder. He is tender to palpation of the proximal humerus. Range of motion not tested secondary to fracture. His sensation to the hand is intact and he is able to make a fist although weak.  Radiographic review 3 views of the left shoulder show evidence of an impacted proximal humerus fracture nondisplaced. No significant glenohumeral arthritis or significant decrease in subacromial space.  Impression is nondisplaced left proximal humerus fracture and minimally ambulatory man who normally does weight-bear on his arms.  Recommendation is for nonweightbearing to the left arm for 6 weeks with use of the sling. He will likely require physical therapy after 6 weeks to regain motion and strength. I would like to see him after he leaves the hospital and to 3 weeks for follow-up x-ray

## 2015-10-13 NOTE — Care Management Obs Status (Signed)
MEDICARE OBSERVATION STATUS NOTIFICATION   Patient Details  Name: Jason [name] MRN: [id number] Date of Birth: [birth year]   Medicare Observation Status Notification Given:  Yes    Adonis Huguenin, RN 10/13/2015, 9:52 AM

## 2015-10-13 NOTE — Care Management Note (Signed)
Case Management Note  Patient Details  Name: Jason [name] MRN: [id number] Date of Birth: 03/07/1933  Subjective/Objective:   Spoke with patient and spouse for discharge planning. Patient answers questions appropriately, He stated that he has had two falls since Sunday. Patient has a bruise to left forhead from his fall Sunday and also bilateral abrasions on knees from same fall. He fell again yesterday at the same area of his home near his lift chair resulting in a fractured humerus and what he says is a fractured toe on his left foot. He stated that he is diabetic.  He stated he does not know how he fell but that he remembers falling. Patient has a Front rolling walker at home which he stated that he uses.  ERphysician has written for home health services at discharge. Patient is observations status and would not qualify for medicare SNF benefits at this time. Spouse stated that she is with hem 24/7 and that there is an adult son there during the afternoon hours. Anticipated discharged at this point is Home with Home Health services.                  Action/Plan:Home with Home Health. Continue to follow  Expected Discharge Date:                  Expected Discharge Plan:  Home w Home Health Services  In-House Referral:     Discharge planning Services  CM Consult  Post Acute Care Choice:  Durable Medical Equipment, Home Health Choice offered to:  Patient, Spouse  DME Arranged:    DME Agency:     HH Arranged:  RN, PT, OT, Nurse's Aide, Social Work [employer]:     Status of Service:  In process, will continue to follow  If discussed at Long Length of Stay Meetings, dates discussed:    Additional Comments:  Adonis Huguenin, RN 10/13/2015, 10:13 AM

## 2015-10-13 NOTE — Progress Notes (Signed)
Patient ID: Jason [name], male   DOB: Aug 01, 1932, 82 y.o.   MRN: [id number]  [employer] PROGRESS NOTE  Jason [name]:[id number] DOB: December 05, 1932 DOA: 10/12/2015 PCP: [name], MD  HPI/Subjective: Patient seen earlier. He is basically in his recliner, he walks with a walker. He has had some frequent falls. A fall last week resulted in bruising of his right eye and left fore head. Yesterday's fall resulted in a dislocated fourth toe and fracture of the left humerus. I was called with relative hypotension.  Objective: Vitals:   10/13/15 1157 10/13/15 1206  BP: (!) 92/42 (!) 91/42  Pulse:    Resp:    Temp:      Filed Weights   10/13/15 0204  Weight: 131.7 kg (290 lb 4.8 oz)    ROS: Review of Systems  Constitutional: Negative for chills and fever.  Eyes: Negative for blurred vision.  Respiratory: Negative for cough and shortness of breath.   Cardiovascular: Negative for chest pain.  Gastrointestinal: Positive for constipation. Negative for abdominal pain, diarrhea, nausea and vomiting.  Genitourinary: Negative for dysuria.  Musculoskeletal: Positive for joint pain.  Neurological: Negative for dizziness and headaches.   Exam: Physical Exam  Constitutional: He is oriented to person, place, and time.  HENT:  Nose: No mucosal edema.  Mouth/Throat: No oropharyngeal exudate or posterior oropharyngeal edema.  Eyes: Conjunctivae, EOM and lids are normal. Pupils are equal, round, and reactive to light.  Neck: No JVD present. Carotid bruit is not present. No edema present. No thyroid mass and no thyromegaly present.  Cardiovascular: S1 normal and S2 normal.  Exam reveals no gallop.   No murmur heard. Pulses:      Dorsalis pedis pulses are 2+ on the right side, and 2+ on the left side.  Respiratory: No respiratory distress. He has no wheezes. He has no rhonchi. He has no rales.  GI: Soft. Bowel sounds are normal. There is no tenderness.  Musculoskeletal:   Right ankle: He exhibits swelling.       Left ankle: He exhibits swelling.  Lymphadenopathy:    He has no cervical adenopathy.  Neurological: He is alert and oriented to person, place, and time.  Diabetic neuropathy  Skin: Skin is warm. Nails show no clubbing.  Erythematous rash over the abdomen. Dried blood left toes between the toes and under the third MT joint. Bruising right eye and over the left eye. Skin tears left shin and knee.  Psychiatric: He has a normal mood and affect.      Data Reviewed: Basic Metabolic Panel:  Recent Labs Lab 10/12/15 1736 10/13/15 0356  NA 140 137  K 4.0 4.1  CL 103 102  CO2 27 26  GLUCOSE 225* 319*  BUN 18 22*  CREATININE 1.16 1.15  CALCIUM 8.5* 8.0*   Liver Function Tests:  Recent Labs Lab 10/12/15 1736  AST 23  ALT 17  ALKPHOS 61  BILITOT 0.6  PROT 6.5  ALBUMIN 3.4*   CBC:  Recent Labs Lab 10/12/15 1736 10/13/15 0356  WBC 7.5 8.0  HGB 14.5 13.1  HCT 43.0 38.8*  MCV 85.4 85.3  PLT 98* 92*   Cardiac Enzymes:  Recent Labs Lab 10/12/15 1736  TROPONINI <0.03    CBG:  Recent Labs Lab 10/13/15 0016 10/13/15 0226 10/13/15 0605 10/13/15 0731 10/13/15 1126  GLUCAP 258* 341* 277* 248* 303*      Studies: Ct Head Wo Contrast  Result Date: 10/12/2015 CLINICAL DATA:  82 year old male with  fall. EXAM: CT HEAD WITHOUT CONTRAST TECHNIQUE: Contiguous axial images were obtained from the base of the skull through the vertex without intravenous contrast. COMPARISON:  None. FINDINGS: Stop there is mild age-related atrophy chronic microvascular ischemic changes. There is no acute intracranial hemorrhage. No mass effect or midline shift noted. The visualized paranasal sinuses and mastoid air cells are clear. The calvarium is intact. IMPRESSION: No acute intracranial hemorrhage. Mild age-related atrophy and chronic microvascular ischemic disease. Electronically Signed   By: Elgie Collard M.D.   On: 10/12/2015 22:22  Dg  Shoulder Left  Result Date: 10/12/2015 CLINICAL DATA:  Status post fall today.  Left shoulder pain. EXAM: LEFT SHOULDER - 2+ VIEW COMPARISON:  None. FINDINGS: The patient has a mildly impacted fracture of the surgical neck of the left humerus. No involvement of the greater tuberosity is identified. The humeral head is located. The acromioclavicular joint is intact with degenerative change noted. Imaged right lung and ribs are unremarkable. IMPRESSION: Mildly impacted surgical neck fracture left humerus. Electronically Signed   By: Drusilla Kanner M.D.   On: 10/12/2015 18:43  Dg Foot Complete Left  Result Date: 10/12/2015 CLINICAL DATA:  Fall EXAM: LEFT FOOT - COMPLETE 3+ VIEW COMPARISON:  None. FINDINGS: No fracture or dislocation is seen. Degenerative changes of the 1st MTP joint. Small plantar calcaneal enthesophyte. Vascular calcifications. IMPRESSION: No fracture or dislocation is seen. Electronically Signed   By: Charline Bills M.D.   On: 10/12/2015 18:44  Dg Hip Unilat With Pelvis 2-3 Views Left  Result Date: 10/13/2015 CLINICAL DATA:  Fall on hospital yesterday while trying to get to the bathroom with left hip pain. EXAM: DG HIP (WITH OR WITHOUT PELVIS) 2-3V LEFT COMPARISON:  None. FINDINGS: Examination demonstrates diffuse decreased bone mineralization. There mild symmetric degenerative changes of the hips. There is no acute fracture or dislocation. There are degenerative changes of the spine and sacroiliac joints. Pelvic phleboliths are present. There is calcified plaque over the femoral arteries. IMPRESSION: No acute fracture or dislocation. Mild symmetric degenerative change of the hips. Electronically Signed   By: Elberta Fortis M.D.   On: 10/13/2015 08:23   Scheduled Meds: . amoxicillin-clavulanate  1 tablet Oral Q12H  . aspirin  81 mg Oral Daily  . FLUoxetine  40 mg Oral Daily  . heparin  5,000 Units Subcutaneous Q8H  . insulin aspart  0-5 Units Subcutaneous QHS  . insulin aspart   0-9 Units Subcutaneous TID WC  . insulin glargine  45 Units Subcutaneous BID  . levothyroxine  125 mcg Oral QAC breakfast  . pantoprazole  40 mg Oral BID  . simvastatin  20 mg Oral QHS  . sodium chloride flush  3 mL Intravenous Q12H  . tamsulosin  0.4 mg Oral Daily    Assessment/Plan:  1. Relative hypotension. Hold enalapril and give fluid bolus. 2. Fracture left humerus. Await orthopedic evaluation. Left arm sling. When necessary pain medication. 3. Left toe dislocation and possible fracture. Await podiatry evaluation. Empiric antibiotics because of the open laceration under the third toe. 4. Type 2 diabetes on half dose insulin for now just in case surgery needed. Allow the patient eat today. 5. Hypothyroidism unspecified on levothyroxine 6. Hyperlipidemia unspecified on simvastatin 7. GERD on Protonix 8. Morbid obesity weight loss needed 9. BPH on Flomax  Code Status:     Code Status Orders        Start     Ordered   10/13/15 0149  Full code  Continuous  10/13/15 0149    Code Status History    Date Active Date Inactive Code Status Order ID Comments User Context   This patient has a current code status but no historical code status.     Family Communication: Wife at the bedside Disposition Plan: Patient will likely need rehabilitation but since admitted as an observation May have to go home with home health.  Consultants:  Orthopedic surgery  Podiatry  Time spent: 35 minutes  Alford Highland  [employer]

## 2015-10-13 NOTE — H&P (Signed)
[facility] [place] at [facility]   PATIENT NAME: Jason [name]    MR#:  [id number]  DATE OF BIRTH:  1933/01/01  DATE OF ADMISSION:  10/12/2015  PRIMARY CARE PHYSICIAN: [name], MD   REQUESTING/REFERRING PHYSICIAN: Shaune Pollack, MD  CHIEF COMPLAINT:   Chief Complaint  Patient presents with  . Fall    HISTORY OF PRESENT ILLNESS:  Jason [name]  is a 82 y.o. male who presents with Fall and subsequent left humerus fracture as well as a left open toe fracture. Patient has a prior history of stroke with residual right-sided weakness and uses a walker to walk most of the time. He is only minimally ambulatory at baseline. His fall today seems to been mechanical in nature. Here in the ED he had fractures confirmed through x-ray, and direct visualization of the left open toe fracture. Orthopedics was consulted by the ED physician who recommended closing the wound and they will consult on this patient tomorrow. Hospitalists were called for admission.  PAST MEDICAL HISTORY:   Past Medical History:  Diagnosis Date  . BPH (benign prostatic hyperplasia)   . Constipation   . COPD (chronic obstructive pulmonary disease) (HCC)   . Diabetes mellitus without complication (HCC)   . GERD (gastroesophageal reflux disease)   . HTN (hypertension)   . Hypothyroidism   . Stroke [facility])     PAST SURGICAL HISTORY:   Past Surgical History:  Procedure Laterality Date  . ROTATOR CUFF REPAIR Left   . THYROID SURGERY      SOCIAL HISTORY:   Social History  Substance Use Topics  . Smoking status: Former Games developer  . Smokeless tobacco: Never Used  . Alcohol use No    FAMILY HISTORY:   Family History  Problem Relation Age of Onset  . Diabetes Mother   . Thyroid disease Mother   . Pancreatic cancer Father     DRUG ALLERGIES:  No Known Allergies  MEDICATIONS AT HOME:   Prior to Admission medications   Medication Sig Start Date End Date Taking? Authorizing  Provider  ADVAIR DISKUS 250-50 MCG/DOSE AEPB Inhale 1 puff into the lungs 2 (two) times daily. 06/09/14  Yes Historical Provider, MD  aspirin 81 MG tablet Take 81 mg by mouth daily.   Yes Historical Provider, MD  enalapril (VASOTEC) 20 MG tablet Take 20 mg by mouth daily. 07/15/14  Yes Historical Provider, MD  FLUoxetine (PROZAC) 40 MG capsule Take 40 mg by mouth daily.   Yes Historical Provider, MD  furosemide (LASIX) 40 MG tablet Take 80 mg by mouth every morning.   Yes Historical Provider, MD  LANTUS SOLOSTAR 100 UNIT/ML Solostar Pen Inject 90 Units into the skin 2 (two) times daily. 06/15/14  Yes Historical Provider, MD  omeprazole (PRILOSEC) 20 MG capsule Take 20 mg by mouth 2 (two) times daily. 06/15/14  Yes Historical Provider, MD  simvastatin (ZOCOR) 20 MG tablet Take 20 mg by mouth at bedtime. 07/08/14  Yes Historical Provider, MD  SYNTHROID 125 MCG tablet Take 125 mcg by mouth daily. 07/24/14  Yes Historical Provider, MD  tamsulosin (FLOMAX) 0.4 MG CAPS capsule Take 0.4 mg by mouth daily.   Yes Historical Provider, MD  ACCU-CHEK COMPACT PLUS test strip Apply 1 strip topically every morning. 05/29/14   Historical Provider, MD    REVIEW OF SYSTEMS:  Review of Systems  Constitutional: Negative for chills, fever, malaise/fatigue and weight loss.  HENT: Negative for ear pain, hearing loss and tinnitus.  Eyes: Negative for blurred vision, double vision, pain and redness.  Respiratory: Negative for cough, hemoptysis and shortness of breath.   Cardiovascular: Negative for chest pain, palpitations, orthopnea and leg swelling.  Gastrointestinal: Negative for abdominal pain, constipation, diarrhea, nausea and vomiting.  Genitourinary: Negative for dysuria, frequency and hematuria.  Musculoskeletal: Positive for falls and joint pain (Left toe, left hip, left shoulder). Negative for back pain and neck pain.  Skin:       No acne, rash, or lesions  Neurological: Negative for dizziness, tremors, focal  weakness and weakness.  Endo/Heme/Allergies: Negative for polydipsia. Does not bruise/bleed easily.  Psychiatric/Behavioral: Negative for depression. The patient is not nervous/anxious and does not have insomnia.      VITAL SIGNS:   Vitals:   10/12/15 2000 10/12/15 2030 10/12/15 2100 10/12/15 2130  BP: (!) 105/39 129/73 (!) 113/53 99/69  Pulse: 99 (!) 102 99 (!) 104  Resp: 14 13 15 11   Temp:      TempSrc:      SpO2: 96% 94% 94% 97%   Wt Readings from Last 3 Encounters:  09/04/14 129.3 kg (285 lb)  08/15/14 129.3 kg (285 lb)    PHYSICAL EXAMINATION:  Physical Exam  Vitals reviewed. Constitutional: He is oriented to person, place, and time. He appears well-developed and well-nourished. No distress.  HENT:  Head: Normocephalic and atraumatic.  Mouth/Throat: Oropharynx is clear and moist.  Eyes: Conjunctivae and EOM are normal. Pupils are equal, round, and reactive to light. No scleral icterus.  Neck: Normal range of motion. Neck supple. No JVD present. No thyromegaly present.  Cardiovascular: Normal rate, regular rhythm and intact distal pulses.  Exam reveals no gallop and no friction rub.   No murmur heard. Respiratory: Effort normal and breath sounds normal. No respiratory distress. He has no wheezes. He has no rales.  GI: Soft. Bowel sounds are normal. He exhibits no distension. There is no tenderness.  Musculoskeletal: He exhibits no edema.  Left arm in sling, left toe bandaged after closure wound by ED physician, tenderness over left hip  Lymphadenopathy:    He has no cervical adenopathy.  Neurological: He is alert and oriented to person, place, and time. No cranial nerve deficit.  No dysarthria, no aphasia  Skin: Skin is warm and dry. No rash noted. No erythema.  Several abrasions and some skin tears on his left lower extremity  Psychiatric: He has a normal mood and affect. His behavior is normal. Judgment and thought content normal.    LABORATORY PANEL:    CBC  Recent Labs Lab 10/12/15 1736  WBC 7.5  HGB 14.5  HCT 43.0  PLT 98*   ------------------------------------------------------------------------------------------------------------------  Chemistries   Recent Labs Lab 10/12/15 1736  NA 140  K 4.0  CL 103  CO2 27  GLUCOSE 225*  BUN 18  CREATININE 1.16  CALCIUM 8.5*  AST 23  ALT 17  ALKPHOS 61  BILITOT 0.6   ------------------------------------------------------------------------------------------------------------------  Cardiac Enzymes  Recent Labs Lab 10/12/15 1736  TROPONINI <0.03   ------------------------------------------------------------------------------------------------------------------  RADIOLOGY:  Ct Head Wo Contrast  Result Date: 10/12/2015 CLINICAL DATA:  82 year old male with fall. EXAM: CT HEAD WITHOUT CONTRAST TECHNIQUE: Contiguous axial images were obtained from the base of the skull through the vertex without intravenous contrast. COMPARISON:  None. FINDINGS: Stop there is mild age-related atrophy chronic microvascular ischemic changes. There is no acute intracranial hemorrhage. No mass effect or midline shift noted. The visualized paranasal sinuses and mastoid air cells are clear. The calvarium  is intact. IMPRESSION: No acute intracranial hemorrhage. Mild age-related atrophy and chronic microvascular ischemic disease. Electronically Signed   By: Elgie Collard M.D.   On: 10/12/2015 22:22  Dg Shoulder Left  Result Date: 10/12/2015 CLINICAL DATA:  Status post fall today.  Left shoulder pain. EXAM: LEFT SHOULDER - 2+ VIEW COMPARISON:  None. FINDINGS: The patient has a mildly impacted fracture of the surgical neck of the left humerus. No involvement of the greater tuberosity is identified. The humeral head is located. The acromioclavicular joint is intact with degenerative change noted. Imaged right lung and ribs are unremarkable. IMPRESSION: Mildly impacted surgical neck fracture left  humerus. Electronically Signed   By: Drusilla Kanner M.D.   On: 10/12/2015 18:43  Dg Foot Complete Left  Result Date: 10/12/2015 CLINICAL DATA:  Fall EXAM: LEFT FOOT - COMPLETE 3+ VIEW COMPARISON:  None. FINDINGS: No fracture or dislocation is seen. Degenerative changes of the 1st MTP joint. Small plantar calcaneal enthesophyte. Vascular calcifications. IMPRESSION: No fracture or dislocation is seen. Electronically Signed   By: Charline Bills M.D.   On: 10/12/2015 18:44   EKG:   Orders placed or performed during the hospital encounter of 10/12/15  . ED EKG  . ED EKG    IMPRESSION AND PLAN:  Principal Problem:   Closed fracture of humerus, surgical neck - left arm currently placed in sling, orthopedics consult are in place to resolve his left toe fractures well, defer to their further recommendations. Active Problems:   Open toe fracture - closed in the ED tonight by ED physician, orthopedics consulted for further management   COPD (chronic obstructive pulmonary disease) (HCC) - continue home inhalers   BPH (benign prostatic hyperplasia) - continue home meds   Diabetes (HCC) - sliding scale insulin with corresponding glucose checks every 6 hours while patient is nothing by mouth   GERD (gastroesophageal reflux disease) - continue home dose PPI   Hypothyroidism - continue home dose thyroid replacement  All the records are reviewed and case discussed with ED provider. Management plans discussed with the patient and/or family.  DVT PROPHYLAXIS: SubQ heparin  GI PROPHYLAXIS: PPI  ADMISSION STATUS: Observation  CODE STATUS: Full Code Status History    This patient does not have a recorded code status. Please follow your organizational policy for patients in this situation.      TOTAL TIME TAKING CARE OF THIS PATIENT: 45 minutes.    Particia Strahm FIELDING 10/13/2015, 1:01 AM  [facility]  Office  [phone]  CC: Primary care physician; [name], MD

## 2015-10-13 NOTE — Progress Notes (Signed)
Offered choice of home health providers, Patient spouse and son Jonny Ruiz chose to go with [facility]. Referral placed with Barbara Cower at [facility]. Order placed for bariatric 3 n 1 chair.  Plan is still Home with HH. Patient may benefit from platform walker.  Awaiting PT evaluation.

## 2015-10-13 NOTE — Progress Notes (Signed)
Clinical Child psychotherapist (CSW) received consult for home health and insurance needs. RN Case Manager aware of above. Patient has Medicare and is under observation and does not have a payer for SNF. CSW will continue to follow and assist as needed.   [employer], LCSW [phone]

## 2015-10-13 NOTE — Progress Notes (Signed)
PT Cancellation Note  Patient Details Name: Jason [name] MRN: [id number] DOB: [birth year]   Cancelled Treatment:    Reason Eval/Treat Not Completed: Medical issues which prohibited therapy;Patient not medically ready. PT has reviewed pt's chart again and discussed with RN. There has not yet been an orthopedic consult for the humerus fx. PT will f/u and complete evaluation once orthopedic consult has been completed and provided precautions.    Adelene Idler, PT, DPT  10/13/15, 2:42 PM [phone]

## 2015-10-13 NOTE — Progress Notes (Signed)
Inpatient Diabetes Program Recommendations  AACE/ADA: New Consensus Statement on Inpatient Glycemic Control (2015)  Target Ranges:  Prepandial:   less than 140 mg/dL      Peak postprandial:   less than 180 mg/dL (1-2 hours)      Critically ill patients:  140 - 180 mg/dL   Results for Jason [name], Jason [name] (MRN [id number]) as of 10/13/2015 10:02  Ref. Range 10/13/2015 00:16 10/13/2015 02:26 10/13/2015 06:05 10/13/2015 07:31  Glucose-Capillary Latest Ref Range: 65 - 99 mg/dL 409 (H) 811 (H) 914 (H) 248 (H)   Review of Glycemic Control  Diabetes history: DM2 Outpatient Diabetes medications: Lantus 90 units QAM, Lantus 100 units QHS, Humalog 20 units TID with meals Current orders for Inpatient glycemic control: Novolog 0-9 units Q6H  Inpatient Diabetes Program Recommendations: Insulin-Correction: Please consider increasing Novolog correction to moderate scale Q4H (if diet is resumed change frequency to ACHS). Insulin - Basal: Patient is currently NPO and he states he has not eaten anything since lunch yesterday. Please consider ordering Lantus 40 units Q24H (based on 131.7 kg x 0.3 units). Please keeep in mind that Lantus may need to be increased if glucose continues to be elevated. HgbA1C: A1C 7.8% on 10/13/15 indicating an average glucose of 177 mg/dl over the past 2-3 months.  NOTE: Spoke with patient over the phone about diabetes and home regimen for diabetes control. Patient reports that he is followed by PCP for diabetes management and currently he takes Lantus 90 units QAM, Lantus 100 units QHS, Humalog 20 units TID with meals as an outpatient for diabetes control. Patient reports that he is taking insulin as prescribed and that he last seen his PCP in February. Patient states that he is not able to see his PCP very often because of transportation issues.  Patient states that he has not checked his glucose is "quite a while" because his glucometer is not working. Patient reports that he has not had any  hypoglycemia over the past 2 weeks but states that he is able to tell if his glucose is too low (has hypoglycemia symptoms of "feeling nervous and just not right" when glucose is low).   Patient reports that he took Lantus 90 units yesterday morning and Humalog 20 units yesterday with breakfast and lunch. Inquired about prior A1C and patient reports that he does not recall his last A1C value. Discussed A1C results (7.8% on 10/13/15) and explained that his current A1C indicates an average glucose of 177 mg/dl over the past 2-3 months. Discussed glucose and A1C goals. Discussed importance of checking CBGs and maintaining good CBG control to prevent long-term and short-term complications. Encouraged patient to be sure to get a new glucometer so he can check his glucose 3-4 times per day. Patient verbalized understanding of information discussed and he states that he has no further questions at this time related to diabetes.  MD, at time of discharge: Patient will need a Rx for a glucometer and testing supplies.  Thanks, Orlando Penner, RN, MSN, CDE Diabetes Coordinator Inpatient Diabetes Program [phone] (Team Pager) [phone] ([facility] office) [phone] [facility] office) [phone] [facility] office)

## 2015-10-13 NOTE — Progress Notes (Signed)
Patient is A&Ox3 with forgetfulness, admitted with multiple falls, left humerus fx, multiple skin tears, left toe fx. Hx of DM2, HTN, hypothyroid. PO pain medication b/c patient's BP trending low. Notified attending, received orders to stop BP meds and NS bolus 

## 2015-10-13 NOTE — Consult Note (Signed)
Reason for Consult: Recent fall with open fracture on the fourth toe and a laceration to the third toe. Referring Physician: [facility]  Jason [name] is an 82 y.o. male.  HPI: The patient relates a recent history of 2 falls over the past week. His most recent fall occurred a couple of days ago when he injured his left shoulder and foot. There was noted to be some bleeding from the toe areas. Presented to the emergency department where he was diagnosed with a humerus fracture as well as an open fracture on his fourth toe. This was relocated in the emergency department and suture.  Past Medical History:  Diagnosis Date  . BPH (benign prostatic hyperplasia)   . Constipation   . COPD (chronic obstructive pulmonary disease) ([place])   . Diabetes mellitus without complication ([place])   . GERD (gastroesophageal reflux disease)   . HTN (hypertension)   . Hypothyroidism   . Stroke [facility])     Past Surgical History:  Procedure Laterality Date  . ROTATOR CUFF REPAIR Left   . THYROID SURGERY      Family History  Problem Relation Age of Onset  . Diabetes Mother   . Thyroid disease Mother   . Pancreatic cancer Father     Social History:  reports that he has quit smoking. He has never used smokeless tobacco. He reports that he does not drink alcohol or use drugs.  Allergies: No Known Allergies  Medications:  Scheduled: . amoxicillin-clavulanate  1 tablet Oral Q12H  . aspirin  81 mg Oral Daily  . FLUoxetine  40 mg Oral Daily  . heparin  5,000 Units Subcutaneous Q8H  . insulin aspart  0-5 Units Subcutaneous QHS  . insulin aspart  0-9 Units Subcutaneous TID WC  . insulin glargine  45 Units Subcutaneous BID  . levothyroxine  125 mcg Oral QAC breakfast  . pantoprazole  40 mg Oral BID  . simvastatin  20 mg Oral QHS  . sodium chloride flush  3 mL Intravenous Q12H  . tamsulosin  0.4 mg Oral Daily    Results for orders placed or performed during the hospital encounter of  10/12/15 (from the past 48 hour(s))  Comprehensive metabolic panel     Status: Abnormal   Collection Time: 10/12/15  5:36 PM  Result Value Ref Range   Sodium 140 135 - 145 mmol/L   Potassium 4.0 3.5 - 5.1 mmol/L   Chloride 103 101 - 111 mmol/L   CO2 27 22 - 32 mmol/L   Glucose, Bld 225 (H) 65 - 99 mg/dL   BUN 18 6 - 20 mg/dL   Creatinine, Ser 1.16 0.61 - 1.24 mg/dL   Calcium 8.5 (L) 8.9 - 10.3 mg/dL   Total Protein 6.5 6.5 - 8.1 g/dL   Albumin 3.4 (L) 3.5 - 5.0 g/dL   AST 23 15 - 41 U/L   ALT 17 17 - 63 U/L   Alkaline Phosphatase 61 38 - 126 U/L   Total Bilirubin 0.6 0.3 - 1.2 mg/dL   GFR calc non Af Amer 57 (L) >60 mL/min   GFR calc Af Amer >60 >60 mL/min    Comment: (NOTE) The eGFR has been calculated using the CKD EPI equation. This calculation has not been validated in all clinical situations. eGFR's persistently <60 mL/min signify possible Chronic Kidney Disease.    Anion gap 10 5 - 15  CBC     Status: Abnormal   Collection Time: 10/12/15  5:36 PM  Result Value Ref Range   WBC 7.5 3.8 - 10.6 K/uL   RBC 5.04 4.40 - 5.90 MIL/uL   Hemoglobin 14.5 13.0 - 18.0 g/dL   HCT 43.0 40.0 - 52.0 %   MCV 85.4 80.0 - 100.0 fL   MCH 28.8 26.0 - 34.0 pg   MCHC 33.7 32.0 - 36.0 g/dL   RDW 13.4 11.5 - 14.5 %   Platelets 98 (L) 150 - 440 K/uL  Troponin I     Status: None   Collection Time: 10/12/15  5:36 PM  Result Value Ref Range   Troponin I <0.03 <0.03 ng/mL  Urinalysis complete, with microscopic     Status: Abnormal   Collection Time: 10/12/15  8:21 PM  Result Value Ref Range   Color, Urine YELLOW (A) YELLOW   APPearance CLEAR (A) CLEAR   Glucose, UA >500 (A) NEGATIVE mg/dL   Bilirubin Urine NEGATIVE NEGATIVE   Ketones, ur TRACE (A) NEGATIVE mg/dL   Specific Gravity, Urine 1.015 1.005 - 1.030   Hgb urine dipstick 1+ (A) NEGATIVE   pH 5.0 5.0 - 8.0   Protein, ur NEGATIVE NEGATIVE mg/dL   Nitrite NEGATIVE NEGATIVE   Leukocytes, UA NEGATIVE NEGATIVE   RBC / HPF 0-5 0 - 5  RBC/hpf   WBC, UA NONE SEEN 0 - 5 WBC/hpf   Bacteria, UA NONE SEEN NONE SEEN   Squamous Epithelial / LPF 0-5 (A) NONE SEEN  Glucose, capillary     Status: Abnormal   Collection Time: 10/13/15 12:16 AM  Result Value Ref Range   Glucose-Capillary 258 (H) 65 - 99 mg/dL  Glucose, capillary     Status: Abnormal   Collection Time: 10/13/15  2:26 AM  Result Value Ref Range   Glucose-Capillary 341 (H) 65 - 99 mg/dL  Basic metabolic panel     Status: Abnormal   Collection Time: 10/13/15  3:56 AM  Result Value Ref Range   Sodium 137 135 - 145 mmol/L   Potassium 4.1 3.5 - 5.1 mmol/L   Chloride 102 101 - 111 mmol/L   CO2 26 22 - 32 mmol/L   Glucose, Bld 319 (H) 65 - 99 mg/dL   BUN 22 (H) 6 - 20 mg/dL   Creatinine, Ser 1.15 0.61 - 1.24 mg/dL   Calcium 8.0 (L) 8.9 - 10.3 mg/dL   GFR calc non Af Amer 58 (L) >60 mL/min   GFR calc Af Amer >60 >60 mL/min    Comment: (NOTE) The eGFR has been calculated using the CKD EPI equation. This calculation has not been validated in all clinical situations. eGFR's persistently <60 mL/min signify possible Chronic Kidney Disease.    Anion gap 9 5 - 15  CBC     Status: Abnormal   Collection Time: 10/13/15  3:56 AM  Result Value Ref Range   WBC 8.0 3.8 - 10.6 K/uL   RBC 4.55 4.40 - 5.90 MIL/uL   Hemoglobin 13.1 13.0 - 18.0 g/dL   HCT 38.8 (L) 40.0 - 52.0 %   MCV 85.3 80.0 - 100.0 fL   MCH 28.8 26.0 - 34.0 pg   MCHC 33.7 32.0 - 36.0 g/dL   RDW 13.5 11.5 - 14.5 %   Platelets 92 (L) 150 - 440 K/uL  Hemoglobin A1c     Status: Abnormal   Collection Time: 10/13/15  3:56 AM  Result Value Ref Range   Hgb A1c MFr Bld 7.8 (H) 4.0 - 6.0 %  Glucose, capillary     Status: Abnormal  Collection Time: 10/13/15  6:05 AM  Result Value Ref Range   Glucose-Capillary 277 (H) 65 - 99 mg/dL  Glucose, capillary     Status: Abnormal   Collection Time: 10/13/15  7:31 AM  Result Value Ref Range   Glucose-Capillary 248 (H) 65 - 99 mg/dL  Glucose, capillary     Status:  Abnormal   Collection Time: 10/13/15 11:26 AM  Result Value Ref Range   Glucose-Capillary 303 (H) 65 - 99 mg/dL    Ct Head Wo Contrast  Result Date: 10/12/2015 CLINICAL DATA:  82 year old male with fall. EXAM: CT HEAD WITHOUT CONTRAST TECHNIQUE: Contiguous axial images were obtained from the base of the skull through the vertex without intravenous contrast. COMPARISON:  None. FINDINGS: Stop there is mild age-related atrophy chronic microvascular ischemic changes. There is no acute intracranial hemorrhage. No mass effect or midline shift noted. The visualized paranasal sinuses and mastoid air cells are clear. The calvarium is intact. IMPRESSION: No acute intracranial hemorrhage. Mild age-related atrophy and chronic microvascular ischemic disease. Electronically Signed   By: Anner Crete M.D.   On: 10/12/2015 22:22  Dg Shoulder Left  Result Date: 10/12/2015 CLINICAL DATA:  Status post fall today.  Left shoulder pain. EXAM: LEFT SHOULDER - 2+ VIEW COMPARISON:  None. FINDINGS: The patient has a mildly impacted fracture of the surgical neck of the left humerus. No involvement of the greater tuberosity is identified. The humeral head is located. The acromioclavicular joint is intact with degenerative change noted. Imaged right lung and ribs are unremarkable. IMPRESSION: Mildly impacted surgical neck fracture left humerus. Electronically Signed   By: Inge Rise M.D.   On: 10/12/2015 18:43  Dg Foot Complete Left  Result Date: 10/12/2015 CLINICAL DATA:  Fall EXAM: LEFT FOOT - COMPLETE 3+ VIEW COMPARISON:  None. FINDINGS: No fracture or dislocation is seen. Degenerative changes of the 1st MTP joint. Small plantar calcaneal enthesophyte. Vascular calcifications. IMPRESSION: No fracture or dislocation is seen. Electronically Signed   By: Julian Hy M.D.   On: 10/12/2015 18:44  Dg Hip Unilat With Pelvis 2-3 Views Left  Result Date: 10/13/2015 CLINICAL DATA:  Fall on hospital yesterday while  trying to get to the bathroom with left hip pain. EXAM: DG HIP (WITH OR WITHOUT PELVIS) 2-3V LEFT COMPARISON:  None. FINDINGS: Examination demonstrates diffuse decreased bone mineralization. There mild symmetric degenerative changes of the hips. There is no acute fracture or dislocation. There are degenerative changes of the spine and sacroiliac joints. Pelvic phleboliths are present. There is calcified plaque over the femoral arteries. IMPRESSION: No acute fracture or dislocation. Mild symmetric degenerative change of the hips. Electronically Signed   By: Marin Olp M.D.   On: 10/13/2015 08:23   Review of Systems  Constitutional: Negative.   HENT: Negative.   Eyes: Negative.   Respiratory: Negative.   Cardiovascular: Negative.   Gastrointestinal: Negative.   Genitourinary: Negative.   Musculoskeletal:       Pain in the right shoulder. Minimal pain in the left foot  Skin:       Bleeding with an open sore on the left third and fourth toes from his recent fall  Neurological:       Relate significant numbness in his lower extremities related to his diabetes.  Endo/Heme/Allergies: Negative.   Psychiatric/Behavioral: Negative.    Blood pressure (!) 91/42, pulse (!) 101, temperature 98.3 F (36.8 C), temperature source Oral, resp. rate 18, height 5' 9" (1.753 m), weight 131.7 kg (290 lb 4.8 oz),  SpO2 91 %. Physical Exam  Cardiovascular:  DP pulses are palpable bilateral but significantly diminished, weak 1 over 4 bilateral. Capillary filling time intact  Musculoskeletal:  Range of motion deferred. Muscle testing deferred. The rectus alignment of the digits.  Skin:  Some mild bleeding noted on the Ace wrap. Xeroform intact over the open laceration and fracture areas on the third and fourth toes. No significant cellulitis or signs of purulence.    Assessment/Plan: Assessment: 1. Open fracture left fourth toe with laceration third toe. 2. Diabetes with associated neuropathy. Plan: Gauze  dressing was applied over the Xeroform followed by a conformer and new Ace wrap. The patient was instructed on being able to weight-bear on the left foot using a surgical shoe but he needs to keep the weight only on the left heel and off of the toe area. Discussed with the patient about being on antibiotics over the next week and a half since he did have an open fracture. Prescription for Augmentin. Keep the bandage clean dry and do not remove. Recommend follow-up middle of next week for reevaluation.  [name] 10/13/2015, 1:11 PM

## 2015-10-14 DIAGNOSIS — S42222A 2-part displaced fracture of surgical neck of left humerus, initial encounter for closed fracture: Secondary | ICD-10-CM | POA: Diagnosis not present

## 2015-10-14 LAB — GLUCOSE, CAPILLARY
Glucose-Capillary: 307 mg/dL — ABNORMAL HIGH (ref 65–99)
Glucose-Capillary: 343 mg/dL — ABNORMAL HIGH (ref 65–99)
Glucose-Capillary: 311 mg/dL — ABNORMAL HIGH (ref 65–99)
Glucose-Capillary: 336 mg/dL — ABNORMAL HIGH (ref 65–99)

## 2015-10-14 MED ORDER — INSULIN GLARGINE 100 UNIT/ML ~~LOC~~ SOLN
90.0000 [IU] | Freq: Two times a day (BID) | SUBCUTANEOUS | Status: DC
  Administered 2015-10-14 – 2015-10-15 (×3): 90 [IU] via SUBCUTANEOUS
  Filled 2015-10-14 (×5): qty 0.9

## 2015-10-14 MED ORDER — POLYETHYLENE GLYCOL 3350 17 G PO PACK
17.0000 g | PACK | Freq: Every day | ORAL | Status: DC
  Administered 2015-10-14 – 2015-10-15 (×2): 17 g via ORAL
  Filled 2015-10-14 (×2): qty 1

## 2015-10-14 NOTE — Evaluation (Signed)
Physical Therapy Evaluation Patient Details Name: Jason [name] MRN: [id number] DOB: 03-21-1932 Today's Date: 10/14/2015   History of Present Illness  presented to ER status post fall at home (endorses 2-3 falls since Sunday); admitted under observation with L 4th toe open fracture (relocated and sutured in ER) and L humeral fracture (plan for non-operative management).  Clinical Impression  Upon evaluation, patient alert and oriented; follows all commands and demonstrates fair insight/safety awareness.  R UE/LE grossly WFL for basic transfers and mobility; L UE immobilized in sling (able to actively flex/extend fingers and wrist) and L LE with post-op shoe donned (able to fully DF/PF ankle). Patient currently requiring heavy +2 assist for all functional mobility; very unsteady and unsafe, even with +2 assist and introduction of hemi-walker.  Recommend patient utilize manual WC as primary mobility at this time and limit movement to transfers only; patient/wife in agreement.  Significant difficulty with sit/stand, especially from lower surface heights; question ability to safely/effectively stand from standard WC height (given difficulty from recliner and edge of bed).  In addition, patient unsafe/unable to complete gait or stair trials at this time; anticipate need for EMS transport upon discharge if patient returns home. Would benefit from skilled PT to address above deficits and promote optimal return to PLOF; recommend transition to STR upon discharge from acute hospitalization.     Follow Up Recommendations SNF    Equipment Recommendations   (hemi walker?)    Recommendations for Other Services OT consult     Precautions / Restrictions Precautions Precautions: Fall Required Braces or Orthoses: Sling;Other Brace/Splint (L UE) Other Brace/Splint: L post-op shoe, encourage WBing through L heel Restrictions Weight Bearing Restrictions: Yes LUE Weight Bearing: Non weight bearing       Mobility  Bed Mobility Overal bed mobility: Needs Assistance;+2 for physical assistance Bed Mobility: Supine to Sit     Supine to sit: Mod assist;+2 for physical assistance     General bed mobility comments: for truncal elevation and pelvic positioning  Transfers Overall transfer level: Needs assistance Equipment used: None Transfers: Sit to/from [employer] Sit to Stand: Mod assist;Max assist;+2 physical assistance Stand pivot transfers: Mod assist;Max assist;+2 physical assistance       General transfer comment: requires R UE support throughout transfer for external stabilization; poor balance reactions apparent.  Very high risk for recurrent falls.  Mod assist +2 towards R, max assist +2 towards L  Ambulation/Gait             General Gait Details: unsafe/unable  Stairs            Wheelchair Mobility    Modified Rankin (Stroke Patients Only)       Balance Overall balance assessment: Needs assistance Sitting-balance support: No upper extremity supported;Feet supported Sitting balance-Leahy Scale: Good     Standing balance support: No upper extremity supported;Single extremity supported Standing balance-Leahy Scale: Poor                               Pertinent Vitals/Pain Pain Assessment: 0-10 Pain Score: 7  Pain Location: L shoulder Pain Descriptors / Indicators: Aching Pain Intervention(s): Limited activity within patient's tolerance;Monitored during session;Repositioned;Patient requesting pain meds-RN notified    Home Living Family/patient expects to be discharged to:: Private residence Living Arrangements: Spouse/significant other Available Help at Discharge: Family;Available 24 hours/day (son also lives with patient/wife, available to assist in PM) Type of Home: House Home Access: Stairs to enter  Entrance Stairs-Rails: Right;Left Entrance Stairs-Number of Steps: 3 Home Layout: One level Home Equipment: Walker  - 2 wheels;Bedside commode;Shower seat;Wheelchair - manual (lift chair)      Prior Function Level of Independence: Needs assistance         Comments: Ambulatory for limited household distances with RW; assist from wife for all ADLs (using shower seat in walk-in shower).  Utilizes manual WC for community distanes.  Does endorse multiple fall history.     Hand Dominance        Extremity/Trunk Assessment   Upper Extremity Assessment:  (R UE grossly WFL for basic tranfsers (elevation limited beyond shoulder height); L UE in sling.  Able to actively flex/extend L fingers and wrist without difficulty.  Denies sensory changes)           Lower Extremity Assessment: Generalized weakness (grossly at least 4-/5 throughout, generally 'sore' due to fall.  Vascular changes noted mid-calf distally bilat)         Communication   Communication: No difficulties  Cognition Arousal/Alertness: Awake/alert Behavior During Therapy: WFL for tasks assessed/performed Overall Cognitive Status: Within Functional Limits for tasks assessed                      General Comments      Exercises Other Exercises Other Exercises: Additional sit/stand and SPT with hemi-walker to R UE, bed height elevated to simulate lift chair in home environment.  Patient able to complete with consistent mod assist +2 (vs max assist +2 without assist device), but continues to remain very unsteady and unsafe.  Significant difficulty with sit/stand from lower seating surfaces (question ability to safely/effectively complete sit/stand from standard WC height).  Do recommend patient remain WC level as primary mobility due to increased fall risk; patient/wife aware.      Assessment/Plan    PT Assessment Patient needs continued PT services  PT Diagnosis Difficulty walking;Generalized weakness;Acute pain   PT Problem List Decreased strength;Decreased range of motion;Decreased activity tolerance;Decreased  balance;Decreased mobility;Decreased knowledge of use of DME;Decreased safety awareness;Decreased knowledge of precautions;Obesity  PT Treatment Interventions DME instruction;Gait training;Stair training;Functional mobility training;Therapeutic activities;Therapeutic exercise;Patient/family education   PT Goals (Current goals can be found in the Care Plan section) Acute Rehab PT Goals Patient Stated Goal: to try to getup PT Goal Formulation: With patient/family Time For Goal Achievement: 10/28/15 Potential to Achieve Goals: Fair    Frequency 7X/week   Barriers to discharge Decreased caregiver support      Co-evaluation               End of Session   Activity Tolerance: Patient tolerated treatment well Patient left: in chair;with call bell/phone within reach;with nursing/sitter in room;with family/visitor present (alarm pad not available; RN informed/aware) Nurse Communication: Mobility status    Functional Assessment Tool Used: clinical judgement Functional Limitation: Mobility: Walking and moving around Mobility: Walking and Moving Around Current Status ([id number]): At least 80 percent but less than 100 percent impaired, limited or restricted Mobility: Walking and Moving Around Goal Status [phone]): At least 20 percent but less than 40 percent impaired, limited or restricted    Time: [phone] PT Time Calculation (min) (ACUTE ONLY): 34 min   Charges:   PT Evaluation $PT Eval Moderate Complexity: 1 Procedure PT Treatments $Therapeutic Activity: 8-22 mins   PT G Codes:   PT G-Codes **NOT FOR INPATIENT CLASS** Functional Assessment Tool Used: clinical judgement Functional Limitation: Mobility: Walking and moving around Mobility: Walking and Moving Around Current Status (  H4327): At least 80 percent but less than 100 percent impaired, limited or restricted Mobility: Walking and Moving Around Goal Status [phone]): At least 20 percent but less than 40 percent impaired, limited or  restricted    Danashia Landers H. Manson Passey, PT, DPT, NCS 10/14/15, 10:26 AM [phone]

## 2015-10-14 NOTE — Care Management Note (Addendum)
Case Management Note  Patient Details  Name: OZRO KLEPACKI MRN: [id number] Date of Birth: 1932/12/16  Subjective/Objective:    Case discussed with Medicial Advisor Dr Judithann Sheen and attending Dr Renae Gloss.  Pat[place]n is unsafe to discharge home at this point . Dr Judithann Sheen will review case. Still working with family on discharge planning.  Patient is Medicare OBS and will not meet for SNF benefit due to  not having inpatient 3 night stay. Likely discharge plan will be home with home health services. Spouse unable to afford to pay for long term placement . Will give list of home care providers to spouse and discuss further.   Will need EMS if discharges to home.  Could patient benefit from [place] lift if insurance would pay?         Action/Plan: Continue to follow Home with HH likely.   Expected Discharge Date:                  Expected Discharge Plan:  Home w Home Health Services  In-House Referral:     Discharge planning Services  CM Consult  Post Acute Care Choice:  Durable Medical Equipment, Home Health Choice offered to:  Patient, Spouse  DME Arranged:    DME Agency:     HH Arranged:  RN, PT, OT, Nurse's Aide, Social Work [employer] Agency:     Status of Service:  In process, will continue to follow  If discussed at Long Length of Stay Meetings, dates discussed:    Additional Comments:  Adonis Huguenin, RN 10/14/2015, 1:10 PM

## 2015-10-14 NOTE — Progress Notes (Signed)
Patient ID: Jason [name], male   DOB: 1932-06-25, 82 y.o.   MRN: [id number]   [facility] PROGRESS NOTE  NAYAN PROCH WUJ:[id number] DOB: [birth year] DOA: 10/12/2015 PCP: [name], MD  HPI/Subjective: Patient has pain in his left humerus. Not so much pain in his left toe. It took 3 people to stand him up. The patient complains of tailbone pain.  Objective: Vitals:   10/14/15 0446 10/14/15 1133  BP: (!) 97/50 (!) 111/39  Pulse: (!) 105 95  Resp: 20 20  Temp: 98.4 F (36.9 C) 98.6 F (37 C)    Filed Weights   10/13/15 0204  Weight: 131.7 kg (290 lb 4.8 oz)    ROS: Review of Systems  Constitutional: Negative for chills and fever.  Eyes: Negative for blurred vision.  Respiratory: Negative for cough and shortness of breath.   Cardiovascular: Negative for chest pain.  Gastrointestinal: Positive for constipation. Negative for abdominal pain, diarrhea, nausea and vomiting.  Genitourinary: Negative for dysuria.  Musculoskeletal: Positive for joint pain.  Neurological: Negative for dizziness and headaches.   Exam: Physical Exam  Constitutional: He is oriented to person, place, and time.  HENT:  Nose: No mucosal edema.  Mouth/Throat: No oropharyngeal exudate or posterior oropharyngeal edema.  Eyes: Conjunctivae, EOM and lids are normal. Pupils are equal, round, and reactive to light.  Neck: No JVD present. Carotid bruit is not present. No edema present. No thyroid mass and no thyromegaly present.  Cardiovascular: S1 normal and S2 normal.  Exam reveals no gallop.   No murmur heard. Pulses:      Dorsalis pedis pulses are 2+ on the right side, and 2+ on the left side.  Respiratory: No respiratory distress. He has no wheezes. He has no rhonchi. He has no rales.  GI: Soft. Bowel sounds are normal. There is no tenderness.  Musculoskeletal:       Right ankle: He exhibits swelling.       Left ankle: He exhibits swelling.  Lymphadenopathy:    He has no cervical  adenopathy.  Neurological: He is alert and oriented to person, place, and time.  Diabetic neuropathy  Skin: Skin is warm. Nails show no clubbing.  Erythematous rash over the abdomen. Dried blood left toes between the toes and under the third MT joint. Bruising right eye and over the left eye. Skin tears left shin and knee.  Psychiatric: He has a normal mood and affect.      Data Reviewed: Basic Metabolic Panel:  Recent Labs Lab 10/12/15 1736 10/13/15 0356  NA 140 137  K 4.0 4.1  CL 103 102  CO2 27 26  GLUCOSE 225* 319*  BUN 18 22*  CREATININE 1.16 1.15  CALCIUM 8.5* 8.0*   Liver Function Tests:  Recent Labs Lab 10/12/15 1736  AST 23  ALT 17  ALKPHOS 61  BILITOT 0.6  PROT 6.5  ALBUMIN 3.4*   CBC:  Recent Labs Lab 10/12/15 1736 10/13/15 0356  WBC 7.5 8.0  HGB 14.5 13.1  HCT 43.0 38.8*  MCV 85.4 85.3  PLT 98* 92*   Cardiac Enzymes:  Recent Labs Lab 10/12/15 1736  TROPONINI <0.03    CBG:  Recent Labs Lab 10/13/15 1126 10/13/15 1641 10/13/15 2130 10/14/15 0726 10/14/15 1135  GLUCAP 303* 349* 353* 307* 343*      Studies: Ct Head Wo Contrast  Result Date: 10/12/2015 CLINICAL DATA:  82 year old male with fall. EXAM: CT HEAD WITHOUT CONTRAST TECHNIQUE: Contiguous axial images were obtained from  the base of the skull through the vertex without intravenous contrast. COMPARISON:  None. FINDINGS: Stop there is mild age-related atrophy chronic microvascular ischemic changes. There is no acute intracranial hemorrhage. No mass effect or midline shift noted. The visualized paranasal sinuses and mastoid air cells are clear. The calvarium is intact. IMPRESSION: No acute intracranial hemorrhage. Mild age-related atrophy and chronic microvascular ischemic disease. Electronically Signed   By: Elgie Collard M.D.   On: 10/12/2015 22:22  Dg Shoulder Left  Result Date: 10/12/2015 CLINICAL DATA:  Status post fall today.  Left shoulder pain. EXAM: LEFT SHOULDER -  2+ VIEW COMPARISON:  None. FINDINGS: The patient has a mildly impacted fracture of the surgical neck of the left humerus. No involvement of the greater tuberosity is identified. The humeral head is located. The acromioclavicular joint is intact with degenerative change noted. Imaged right lung and ribs are unremarkable. IMPRESSION: Mildly impacted surgical neck fracture left humerus. Electronically Signed   By: Drusilla Kanner M.D.   On: 10/12/2015 18:43  Dg Foot Complete Left  Result Date: 10/12/2015 CLINICAL DATA:  Fall EXAM: LEFT FOOT - COMPLETE 3+ VIEW COMPARISON:  None. FINDINGS: No fracture or dislocation is seen. Degenerative changes of the 1st MTP joint. Small plantar calcaneal enthesophyte. Vascular calcifications. IMPRESSION: No fracture or dislocation is seen. Electronically Signed   By: Charline Bills M.D.   On: 10/12/2015 18:44  Dg Hip Unilat With Pelvis 2-3 Views Left  Result Date: 10/13/2015 CLINICAL DATA:  Fall on hospital yesterday while trying to get to the bathroom with left hip pain. EXAM: DG HIP (WITH OR WITHOUT PELVIS) 2-3V LEFT COMPARISON:  None. FINDINGS: Examination demonstrates diffuse decreased bone mineralization. There mild symmetric degenerative changes of the hips. There is no acute fracture or dislocation. There are degenerative changes of the spine and sacroiliac joints. Pelvic phleboliths are present. There is calcified plaque over the femoral arteries. IMPRESSION: No acute fracture or dislocation. Mild symmetric degenerative change of the hips. Electronically Signed   By: Elberta Fortis M.D.   On: 10/13/2015 08:23   Scheduled Meds: . amoxicillin-clavulanate  1 tablet Oral Q12H  . aspirin  81 mg Oral Daily  . FLUoxetine  40 mg Oral Daily  . heparin  5,000 Units Subcutaneous Q8H  . insulin aspart  0-5 Units Subcutaneous QHS  . insulin aspart  0-9 Units Subcutaneous TID WC  . insulin glargine  90 Units Subcutaneous BID  . levothyroxine  125 mcg Oral QAC breakfast   . pantoprazole  40 mg Oral BID  . simvastatin  20 mg Oral QHS  . sodium chloride flush  3 mL Intravenous Q12H  . tamsulosin  0.4 mg Oral Daily    Assessment/Plan:  1. Relative hypotension. Hold enalapril. 2. Fracture left humerus. Left arm sling. When necessary pain medication. 3. Left toe dislocation and possible fracture. Empiric antibiotics because of the open laceration under the third toe. 4. Type 2 diabetes increase insulin to 90 units twice a day like he takes at home. 5. Hypothyroidism unspecified on levothyroxine 6. Hyperlipidemia unspecified on simvastatin 7. GERD on Protonix 8. Morbid obesity weight loss needed 9. BPH on Flomax 10. Thrombocytopenia. This is chronic back since 2013. Check a hepatitis C. 11. Weakness. Right now this is an unsafe discharge since that took 3 people to get him up. Normally he is in his recliner. But he does get up to use the bathroom with his walker and he has been falling recently. It took 3 people to stand  him up today. Because of his obesity and fracture of the left arm he is not able to use his left arm to stabilize his weight with his walker. Unfortunately patient is still an observation. I do not have a safe discharge at this point. Continue physical therapy.  Code Status:     Code Status Orders        Start     Ordered   10/13/15 0149  Full code  Continuous     10/13/15 0149    Code Status History    Date Active Date Inactive Code Status Order ID Comments User Context   This patient has a current code status but no historical code status.     Family Communication: Family at the bedside Disposition Plan: Physical therapy recommended rehabilitation but patient still currently observation status.  Consultants:  Orthopedic surgery  Podiatry  Time spent: 30 minutes  Alford Highland  [employer]

## 2015-10-14 NOTE — Evaluation (Signed)
Occupational Therapy Evaluation Patient Details Name: ROMIE TAY MRN: [id number] DOB: [birth year] Today's Date: 10/14/2015    History of Present Illness Pt. was admitted with Left 4th toe open fracture, and a Left Humeral Fracture (Non-operative management).   Clinical Impression   Pt. was admitted to [facility] with a Left toe open fracture, and a Left Humeral Fracture. Pt. Presents with weakness, decreased activity tolerance, decreased functional mobility, and immobilized LUE which hinder his ability to complete ADL tasks. Pt. Could benefit from skilled OT services for ADL and A/E training, work simplification, functional mobility for ADLs, and pt./caregiver education in order improve ADL and IADL functioing and return to his PLOF.    Follow Up Recommendations  SNF    Equipment Recommendations       Recommendations for Other Services       Precautions / Restrictions Precautions Precautions: Fall Required Braces or Orthoses: Sling;Other Brace/Splint Other Brace/Splint: L post-op shoe, encourage WBing through L heel Restrictions Weight Bearing Restrictions: Yes LUE Weight Bearing: Non weight bearing                                                                         ADL Overall ADL's : Needs assistance/impaired Eating/Feeding: Set up (Pt. requires complete setup of meal tray. Pt. is able to use utensils with right hand. Pt. wife has been feeding pt.Marland Kitchen)                   Lower Body Dressing: Total assistance   Toilet Transfer: Total assistance           Functional mobility during ADLs:  (Mobility referred to PT: MaxAx2)       Vision     Perception     Praxis      Pertinent Vitals/Pain Pain Assessment: 0-10 Pain Score: 10-Worst pain ever Pain Location: Left Shoulder     Hand Dominance Right   Extremity/Trunk Assessment Upper Extremity Assessment Upper Extremity Assessment: LUE deficits/detail LUE Deficits /  Details: LUE NWB and immobilized.           Communication Communication Communication: No difficulties   Cognition Arousal/Alertness: Awake/alert Behavior During Therapy: WFL for tasks assessed/performed Overall Cognitive Status: Within Functional Limits for tasks assessed                     General Comments       Exercises       Shoulder Instructions      Home Living Family/patient expects to be discharged to:: Private residence Living Arrangements: Spouse/significant other Available Help at Discharge: Family;Available 24 hours/day Type of Home: House Home Access: Stairs to enter [employer] of Steps: 3 Entrance Stairs-Rails: Right;Left Home Layout: One level     Bathroom Shower/Tub: Walk-in shower;Door   [employer] Toilet: Standard     Home Equipment: Environmental consultant - 2 wheels;Bedside commode;Shower seat;Wheelchair - manual;Hand held shower head;Adaptive equipment          Prior Functioning/Environment Level of Independence: Needs assistance        Comments: Wife assists pt. with all ADLs.    OT Diagnosis: Generalized weakness   OT Problem List: Decreased strength;Pain;Decreased activity tolerance;Decreased knowledge of use of DME or AE;Impaired UE functional use;Decreased  coordination;Decreased range of motion;Impaired balance (sitting and/or standing)   OT Treatment/Interventions: Self-care/ADL training;Patient/family education;DME and/or AE instruction    OT Goals(Current goals can be found in the care plan section) Acute Rehab OT Goals Patient Stated Goal: To return home.  OT Frequency: Min 1X/week   Barriers to D/C:            Co-evaluation              End of Session    Activity Tolerance: Patient tolerated treatment well Patient left: in bed;with call bell/phone within reach;with bed alarm set;with family/visitor present   Time: 0120-0147 OT Time Calculation (min): 27 min Charges:  OT General Charges $OT Visit: 1  Procedure OT Evaluation $OT Eval High Complexity: 1 Procedure G-Codes:    Olegario Messier, MS, OTR/L Olegario Messier 10/14/2015, 3:19 PM

## 2015-10-14 NOTE — Care Management Note (Addendum)
Case Management Note  Patient Details  Name: SWAN HANDZEL MRN: [id number] Date of Birth: 11/21/1932  Subjective/Objective:    Spoke with patient spouse again about discharge plan via telephone. She expressed understanding of plan to discharge home tomorrow and that insurance will not pay for SNF. I discussed resources available at time of discharge including self payer home care services. I left list of providers in room for her information. She understands that the patient will not meet medical necessity for continued stay.  Offered to get a Michiel Sites lift for the home for assist her and her son in lifting the patient and she stated that this would be ok . Patient has all other DME required in the home and will have a wide BSC delivered along with the [facility] lift from [facility].  Patient has been opened by [facility] for PT, OT,RN, Nurses aid, Child psychotherapist. Orders placed by attending physician and case discussed.  Anticipated discharge is home with Home Health Tomorrow. Spouse is agreeable to plan.                Action/Plan: Home with Home Health.   Expected Discharge Date:                  Expected Discharge Plan:  Home w Home Health Services  In-House Referral:     Discharge planning Services  CM Consult  Post Acute Care Choice:  Durable Medical Equipment, Home Health Choice offered to:  Patient, Spouse, Adult Children  DME Arranged:  3-N-1, Other see comment Michiel Sites lift) DME Agency:  [facility]  HH Arranged:  RN, PT, OT, Nurse's Aide, Social Work [employer] Agency:  [facility]  Status of Service:  In process, will continue to follow  If discussed at Long Length of Stay Meetings, dates discussed:    Additional Comments:  Adonis Huguenin, RN 10/14/2015, 3:37 PM

## 2015-10-14 NOTE — Clinical Social Work Note (Signed)
Clinical Social Work Assessment  Patient Details  Name: Jason [name] MRN: [id number] Date of Birth: [birth year]  Date of referral:  10/14/15               Reason for consult:  Discharge Planning, Facility Placement, Insurance Barriers                Permission sought to share information with:  Case Manager Permission granted to share information::  Yes, Verbal Permission Granted  Name::        Agency::     Relationship::     Contact Information:     Housing/Transportation Living arrangements for the past 2 months:  Single Family Home Source of Information:  Patient, Spouse, Friend/Neighbor Patient Interpreter Needed:  None Criminal Activity/Legal Involvement Pertinent to Current Situation/Hospitalization:  No - Comment as needed Significant Relationships:  Spouse, Friend Lives with:  Spouse Do you feel safe going back to the place where you live?  Yes Need for family participation in patient care:  Yes (Comment)  Care giving concerns:  Patient lives in [place] with his wife Jason [name].    Social Worker assessment / plan:  Holiday representative (CSW) received SNF consult. PT is recommending SNF however patient is under Medicare Observation. CSW met with patient and his wife Jason [name] and their friend was at bedside. Patient was alert and oriented and was sitting up in the chair. CSW introduced self and explained role of CSW department. CSW explained that patient is under Medicare Observation, which means that Medicare will not pay for patient to go to SNF. CSW discussed private pay options. Patient reported that he cannot pay privately for SNF. CSW explained that home health is an option that Medicare will pay for. Patient and wife are agreeable to home health. RN Case Manager is aware of above. CSW will continue to follow and assist as needed.   Employment status:  Retired Forensic scientist:  Medicare PT Recommendations:  [place] / Referral to community  resources:  Other (Comment Required) (Patient is Medicare Observation and cannot pay privately for SNF. )  Patient/Family's Response to care:  Patient and wife are agreeable to home health.   Patient/Family's Understanding of and Emotional Response to Diagnosis, Current Treatment, and Prognosis:  Patient and wife were pleasant and thanked CSW for visit.   Emotional Assessment Appearance:  Appears stated age Attitude/Demeanor/Rapport:    Affect (typically observed):  Accepting, Adaptable, Pleasant Orientation:  Oriented to Self, Oriented to Place, Oriented to  Time, Oriented to Situation Alcohol / Substance use:  Not Applicable Psych involvement (Current and /or in the community):  No (Comment)  Discharge Needs  Concerns to be addressed:  Discharge Planning Concerns Readmission within the last 30 days:  No Current discharge risk:  Dependent with Mobility Barriers to Discharge:  Inadequate or no insurance, Continued Medical Work up   [employer], Veronia Beets, LCSW 10/14/2015, 4:27 PM

## 2015-10-14 NOTE — Progress Notes (Addendum)
Inpatient Diabetes Program Recommendations  AACE/ADA: New Consensus Statement on Inpatient Glycemic Control (2015)  Target Ranges:  Prepandial:   less than 140 mg/dL      Peak postprandial:   less than 180 mg/dL (1-2 hours)      Critically ill patients:  140 - 180 mg/dL   Results for Jason [name], Jason [name] (MRN [id number]) as of 10/14/2015 12:40  Ref. Range 10/13/2015 07:31 10/13/2015 11:26 10/13/2015 16:41 10/13/2015 21:30 10/14/2015 07:26 10/14/2015 11:35  Glucose-Capillary Latest Ref Range: 65 - 99 mg/dL 585 (H) 277 (H) 824 (H) 353 (H) 307 (H) 343 (H)   Review of Glycemic Control  Diabetes history: DM2 Outpatient Diabetes medications: Lantus 90 units QAM, Lantus 100 units QHS, Humalog 20 units TID with meals Current orders for Inpatient glycemic control: Lantus 90 units BID, Novolog 0-9 units TID with meals, Novolog 0-5 units QHS  Inpatient Diabetes Program Recommendations:  Insulin - Basal: In reviewing chart, noted patient received Lantus 45 units on 10/13/15 at 22:08 (this is the only dose of Lantus received on 10/13/15). Noted Lantus was increased today from 45 units BID to 90 units BID.  Insulin - Meal Coverage: If post prandial glucose continues to be elevated despite increase in Lantus, please consider ordering Novolog meal coverage. HgbA1C: A1C 7.8% on 10/13/15 indicating an average glucose of 177 mg/dl over the past 2-3 months.  Thanks, Orlando Penner, RN, MSN, CDE Diabetes Coordinator Inpatient Diabetes Program [phone] (Team Pager from 8am to 5pm) [phone] ([facility] office) [phone] [facility] office) [phone] [facility] office)

## 2015-10-14 NOTE — Progress Notes (Signed)
Physical Therapy Treatment Patient Details Name: Jason [name] MRN: [id number] DOB: 07/17/1932 Today's Date: 10/14/2015    History of Present Illness presented to ER status post fall at home (endorses 2-3 falls since Sunday); admitted under observation with L 4th toe open fracture (relocated and sutured in ER) and L humeral fracture (plan for non-operative management).    PT Comments    Planned to continue transfer training between surfaces (brought manual WC to room to simulate WC in home environment), but patient unable to complete full transfer due to pain in L UE this PM (provoking nausea and prompting patient to request return to bed).  Slight improvement in sit/stand from elevated bed surface with R UE on hemi-walker, but continues to require +2 for optimal safety.  Will continue sit/stand and transfers training next session as appropriate.   Follow Up Recommendations  SNF     Equipment Recommendations       Recommendations for Other Services       Precautions / Restrictions Precautions Precautions: Fall Required Braces or Orthoses: Sling;Other Brace/Splint Other Brace/Splint: L post-op shoe, encourage WBing through L heel Restrictions Weight Bearing Restrictions: Yes LUE Weight Bearing: Non weight bearing    Mobility  Bed Mobility Overal bed mobility: Needs Assistance Bed Mobility: Supine to Sit;Sit to Supine     Supine to sit: Mod assist Sit to supine: Max assist      Transfers Overall transfer level: Needs assistance Equipment used: Hemi-walker Transfers: Sit to/from Stand Sit to Stand: Mod assist;+2 safety/equipment         General transfer comment: improved ability from elevated bed surface, but continues to require +2 for safety.  Difficulty maintaining R LE on floor with movement transition; blocking from therapist to maintain placement  Ambulation/Gait             General Gait Details: unsafe/unable   Stairs            Wheelchair  Mobility    Modified Rankin (Stroke Patients Only)       Balance Overall balance assessment: Needs assistance Sitting-balance support: No upper extremity supported;Feet supported Sitting balance-Leahy Scale: Fair Sitting balance - Comments: L lateral lean with fatigue   Standing balance support: Single extremity supported Standing balance-Leahy Scale: Poor                      Cognition Arousal/Alertness: Awake/alert Behavior During Therapy: WFL for tasks assessed/performed Overall Cognitive Status: Within Functional Limits for tasks assessed                      Exercises Other Exercises Other Exercises: Sit/stand from edge of bed x5 with hemi-walker, mod asist +2 for safety (deficits as noted above). Unable to maintain stance beyond 4-5 seconds, unable to attempt transfer due to pain in L shoulder.  Other Exercises: Did discuss and attempt lateral scoot pivot transfer over level surfaces to promote greater indep/safety with transfers; however, patient unable to initiate lift off or lateral movement at this time due to LE weakness, generalized soreness and limited ability to generate adequate anterior weight translation due to abdominal adiposity.    General Comments        Pertinent Vitals/Pain Pain Assessment: 0-10 Pain Score: 9  Pain Location: L shoulder Pain Descriptors / Indicators: Aching Pain Intervention(s): Limited activity within patient's tolerance;Monitored during session;Patient requesting pain meds-RN notified;Repositioned;Relaxation    Home Living Family/patient expects to be discharged to:: Private residence Living Arrangements: Spouse/significant other  Available Help at Discharge: Family;Available 24 hours/day Type of Home: House Home Access: Stairs to enter Entrance Stairs-Rails: Right;Left Home Layout: One level Home Equipment: Environmental consultant - 2 wheels;Bedside commode;Shower seat;Wheelchair - manual;Hand held shower head;Adaptive equipment       Prior Function Level of Independence: Needs assistance      Comments: Wife assists pt. with all ADLs.   PT Goals (current goals can now be found in the care plan section) Acute Rehab PT Goals Patient Stated Goal: To return home. PT Goal Formulation: With patient/family Time For Goal Achievement: 10/28/15 Potential to Achieve Goals: Fair Progress towards PT goals: Not progressing toward goals - comment (limited by pain)    Frequency  7X/week    PT Plan Current plan remains appropriate    Co-evaluation             End of Session Equipment Utilized During Treatment: Gait belt Activity Tolerance: Patient limited by pain Patient left: with call bell/phone within reach;in bed;with bed alarm set     Time: 1550-1609 PT Time Calculation (min) (ACUTE ONLY): 19 min  Charges:  $Therapeutic Activity: 8-22 mins                    G Codes:      Jayd Forrey H. Manson Passey, PT, DPT, NCS 10/14/15, 5:03 PM [phone]

## 2015-10-15 DIAGNOSIS — S42222A 2-part displaced fracture of surgical neck of left humerus, initial encounter for closed fracture: Secondary | ICD-10-CM | POA: Diagnosis not present

## 2015-10-15 LAB — HEPATITIS C ANTIBODY: HCV Ab: <0.1 s/co ratio (ref 0.0–0.9)

## 2015-10-15 LAB — GLUCOSE, CAPILLARY
GLUCOSE-CAPILLARY: 301 mg/dL — AB (ref 65–99)
Glucose-Capillary: 266 mg/dL — ABNORMAL HIGH (ref 65–99)

## 2015-10-15 MED ORDER — AMOXICILLIN-POT CLAVULANATE 875-125 MG PO TABS: 1.0000 | ORAL_TABLET | Freq: Two times a day (BID) | ORAL | 0 refills | Status: DC

## 2015-10-15 MED ORDER — BLOOD GLUCOSE MONITOR KIT: PACK | 0 refills | Status: AC

## 2015-10-15 MED ORDER — OXYCODONE HCL 5 MG PO TABS
5.0000 mg | ORAL_TABLET | ORAL | 0 refills | Status: DC | PRN
Start: 2015-10-15 — End: 2016-03-07

## 2015-10-15 NOTE — Care Management Note (Signed)
Case Management Note  Patient Details  Name: Jason [name] MRN: [id number] Date of Birth: [birth year]  Subjective/Objective:     A demographic sheet and a Medical Necessity form on front of chart for EMS transport.                Action/Plan:   Expected Discharge Date:                  Expected Discharge Plan:  Home w Home Health Services  In-House Referral:     Discharge planning Services  CM Consult  Post Acute Care Choice:  Durable Medical Equipment, Home Health Choice offered to:  Patient, Spouse, Adult Children  DME Arranged:  3-N-1, Other see comment Michiel Sites lift) DME Agency:  [facility]  HH Arranged:  RN, PT, OT, Nurse's Aide, Social Work [employer] Agency:  [facility]  Status of Service:  In process, will continue to follow  If discussed at Long Length of Stay Meetings, dates discussed:    Additional Comments:  Jason [name] A, RN 10/15/2015, 10:31 AM

## 2015-10-15 NOTE — Discharge Instructions (Signed)
You were evaluated after a fall, and are found to have a left humerus fracture. We are your splint to help avoid movement. Follow-up with an orthopedic physician, referred to Dr. Hyacinth Meeker.  I attempted to have some home health evaluation started, if you do not hear anything tomorrow, please call your primary care physician to initiate the home health care.  Return to the emergency room for any worsening condition including new or worsening pain, weakness, numbness, signs of infection to the toe such as redness, drainage, or rash or fevers.

## 2015-10-15 NOTE — Progress Notes (Signed)
Physical Therapy Treatment Patient Details Name: Jason [name] MRN: [id number] DOB: [birth year] Today's Date: 10/15/2015    History of Present Illness presented to ER status post fall at home (endorses 2-3 falls since Sunday); admitted under observation with L 4th toe open fracture (relocated and sutured in ER) and L humeral fracture (plan for non-operative management).    PT Comments    Pt and wife are understandably concerned about his abiltiy to manage at home.  Encouraged him to think about sitting in bed to transition to wc and to use recliner just in the daytime.  HHPT is not his best care level and will need to use the hoyer lift to get up with wife potentially.  Talked with her about positioning for comfort and how to adjust the LUE sling properly.  Will be leaving in a short time for home and encouraged family to call hosp with any questions if they occur at home.  Follow Up Recommendations  SNF     Equipment Recommendations  None recommended by PT    Recommendations for Other Services OT consult     Precautions / Restrictions Precautions Precautions: Fall Required Braces or Orthoses: Sling;Other Brace/Splint Other Brace/Splint: L post-op shoe, encourage WBing through L heel Restrictions Weight Bearing Restrictions: Yes LUE Weight Bearing: Non weight bearing Other Position/Activity Restrictions: WB through L heel only    Mobility  Bed Mobility Overal bed mobility: Needs Assistance Bed Mobility: Supine to Sit;Sit to Supine     Supine to sit: Mod assist Sit to supine: Max assist   General bed mobility comments: assisted trunk to sit up and LE's mainly back to bed  Transfers Overall transfer level: Needs assistance Equipment used: 1 person hand held assist (used sink for standing support) Transfers: Sit to/from Stand Sit to Stand: Mod assist;From elevated surface (at sink)         General transfer comment: pt could stand with one person to sidestep with the  sink as support  Ambulation/Gait Ambulation/Gait assistance: Mod assist Ambulation Distance (Feet): 3 Feet Assistive device: 1 person hand held assist (sink counter) Gait Pattern/deviations: Step-to pattern;Wide base of support;Trunk flexed;Decreased dorsiflexion - left;Decreased dorsiflexion - right;Decreased stride length;Decreased stance time - left Gait velocity: reduced Gait velocity interpretation: Below normal speed for age/gender General Gait Details: short trip with assistance, more comfortable   Stairs            Wheelchair Mobility    Modified Rankin (Stroke Patients Only)       Balance Overall balance assessment: History of Falls;Needs assistance Sitting-balance support: Feet supported Sitting balance-Leahy Scale: Good   Postural control: Posterior lean Standing balance support: Bilateral upper extremity supported Standing balance-Leahy Scale: Poor Standing balance comment: pt could use sink as steadying tool                    Cognition Arousal/Alertness: Awake/alert Behavior During Therapy: WFL for tasks assessed/performed Overall Cognitive Status: Within Functional Limits for tasks assessed                      Exercises      General Comments        Pertinent Vitals/Pain Pain Assessment: 0-10 Pain Score: 8  Pain Location: L shoulder Pain Descriptors / Indicators: Aching;Dull;Sore Pain Intervention(s): Limited activity within patient's tolerance;Monitored during session;Premedicated before session;Repositioned    Home Living  Prior Function            PT Goals (current goals can now be found in the care plan section) Acute Rehab PT Goals Patient Stated Goal: to get help for moving Progress towards PT goals: Progressing toward goals    Frequency  7X/week    PT Plan Current plan remains appropriate    Co-evaluation             End of Session Equipment Utilized During Treatment:  Gait belt Activity Tolerance: Patient tolerated treatment well Patient left: in bed;with call bell/phone within reach;with bed alarm set     Time: [id number] PT Time Calculation (min) (ACUTE ONLY): 38 min  Charges:  $Gait Training: 8-22 mins $Therapeutic Exercise: 8-22 mins $Therapeutic Activity: 8-22 mins                    G Codes:  Functional Assessment Tool Used: clinical judgment Functional Limitation: Mobility: Walking and moving around Mobility: Walking and Moving Around Current Status (W1191): At least 60 percent but less than 80 percent impaired, limited or restricted Mobility: Walking and Moving Around Goal Status [phone]): At least 1 percent but less than 20 percent impaired, limited or restricted   Ivar Drape 10/15/2015, 12:32 PM    Samul Dada, PT MS Acute Rehab Dept. Number: [facility] [phone] and [facility] [phone]

## 2015-10-15 NOTE — Progress Notes (Signed)
EMS here to transport pt. 

## 2015-10-15 NOTE — Care Management Note (Addendum)
Case Management Note  Patient Details  Name: NIMA SILL MRN: [id number] Date of Birth: 01/27/1933  Subjective/Objective:  Call to [facility] about home need for a hoyer lift and a bariatric BSC, and was advised that they were delivered to Mr Calais's home on Friday 7/ 28/17. A resumption of care for RN, PT, Aide, SW and OT was faxed to [facility] by this Clinical research associate. Mr Fonger in insured and is not eligible for a charity glucometer. Family can obtain a glucometer for him at any pharmacy.                   Action/Plan:   Expected Discharge Date:                  Expected Discharge Plan:  Home w Home Health Services  In-House Referral:     Discharge planning Services  CM Consult  Post Acute Care Choice:  Durable Medical Equipment, Home Health Choice offered to:  Patient, Spouse, Adult Children  DME Arranged:  3-N-1, Other see comment Michiel Sites lift) DME Agency:  [facility]  HH Arranged:  RN, PT, OT, Nurse's Aide, Social Work [employer] Agency:  [facility]  Status of Service:  In process, will continue to follow  If discussed at Long Length of Stay Meetings, dates discussed:    Additional Comments:  Kymani Shimabukuro A, RN 10/15/2015, 8:28 AM

## 2015-10-15 NOTE — Discharge Summary (Signed)
Jason [name] at [place] NAME: Jason [name]    MR#:  [id number]  DATE OF BIRTH:  [birth year]  DATE OF ADMISSION:  10/12/2015 ADMITTING PHYSICIAN: Jason Coon, MD  DATE OF DISCHARGE: 10/15/15  PRIMARY CARE PHYSICIAN: Jason Castle, MD    ADMISSION DIAGNOSIS:  Laceration of fourth toe of left foot, initial encounter [S91.115A] Facial contusion, initial encounter [S00.83XA] Laceration of toe, initial encounter [S91.119A] Fall at home, initial encounter [W19.XXXA, Y92.099] Humerus fracture, left, closed, initial encounter [S42.302A] Fracture of foot, open, right, initial encounter [S92.901B]  DISCHARGE DIAGNOSIS:  Principal Problem:   Closed fracture of humerus, surgical neck Active Problems:   COPD (chronic obstructive pulmonary disease) (HCC)   GERD (gastroesophageal reflux disease)   Hypothyroidism   BPH (benign prostatic hyperplasia)   Diabetes ([place])   Open toe fracture   Pressure ulcer   SECONDARY DIAGNOSIS:   Past Medical History:  Diagnosis Date  . BPH (benign prostatic hyperplasia)   . Constipation   . COPD (chronic obstructive pulmonary disease) ([place])   . Diabetes mellitus without complication ([place])   . GERD (gastroesophageal reflux disease)   . HTN (hypertension)   . Hypothyroidism   . Stroke [facility])     HOSPITAL COURSE:  Jason [name]  is a 82 y.o. male admitted 10/12/2015 with chief complaint Fall . Please see H&P performed by Jason Coon, MD for further information. Patient presented with the above complaints after a fall. Found to have left humerus fracture in addition to left 4th toe open fracture. Evaluated by both orthopedic surgery and podiatry who recommended non operative management. He was placed on antibiotics give open fracture, and to follow with both ortho and podiatry as outpatient.   DISCHARGE CONDITIONS:   stable  CONSULTS OBTAINED:  Treatment Team:  Jason [name], DPM  DRUG ALLERGIES:  No  Known Allergies  DISCHARGE MEDICATIONS:   Current Discharge Medication List    START taking these medications   Details  amoxicillin-clavulanate (AUGMENTIN) 875-125 MG tablet Take 1 tablet by mouth every 12 (twelve) hours. Qty: 10 tablet, Refills: 0    blood glucose meter kit and supplies KIT Dispense based on patient and insurance preference. Use up to four times daily as directed. (FOR ICD-9 250.00, 250.01). Qty: 1 each, Refills: 0    oxyCODONE (OXY IR/ROXICODONE) 5 MG immediate release tablet Take 1 tablet (5 mg total) by mouth every 4 (four) hours as needed for moderate pain. Qty: 30 tablet, Refills: 0      CONTINUE these medications which have NOT CHANGED   Details  ADVAIR DISKUS 250-50 MCG/DOSE AEPB Inhale 1 puff into the lungs 2 (two) times daily.    aspirin 81 MG tablet Take 81 mg by mouth daily.    enalapril (VASOTEC) 20 MG tablet Take 20 mg by mouth daily.    FLUoxetine (PROZAC) 40 MG capsule Take 40 mg by mouth daily.    furosemide (LASIX) 40 MG tablet Take 80 mg by mouth every morning.    insulin lispro (HUMALOG) 100 UNIT/ML injection Inject 20 Units into the skin 3 (three) times daily with meals.    LANTUS SOLOSTAR 100 UNIT/ML Solostar Pen Inject 90-100 Units into the skin 2 (two) times daily. Patient reports he takes Lantus 90 units QAM and Lantus 100 units QHS    omeprazole (PRILOSEC) 20 MG capsule Take 20 mg by mouth 2 (two) times daily.    simvastatin (ZOCOR) 20 MG tablet Take 20 mg by mouth at  bedtime.    SYNTHROID 125 MCG tablet Take 125 mcg by mouth daily.    tamsulosin (FLOMAX) 0.4 MG CAPS capsule Take 0.4 mg by mouth daily.    ACCU-CHEK COMPACT PLUS test strip Apply 1 strip topically every morning.         DISCHARGE INSTRUCTIONS:    Plan: Gauze dressing was applied over the Xeroform followed by a conformer and new Ace wrap.  The patient was instructed on being able to weight-bear on the left foot using a surgical shoe but he needs to keep the  weight only on the left heel and off of the toe area.   Keep the bandage clean dry and do not remove.  DIET:  Diabetic diet  DISCHARGE CONDITION:  Stable  ACTIVITY:  Activity as tolerated  weight-bear on the left foot using a surgical shoe but he needs to keep the weight only on the left heel and off of the toe area.   OXYGEN:  Home Oxygen: No.   Oxygen Delivery: room air  DISCHARGE LOCATION:  home   If you experience worsening of your admission symptoms, develop shortness of breath, life threatening emergency, suicidal or homicidal thoughts you must seek medical attention immediately by calling 911 or calling your MD immediately  if symptoms less severe.  You Must read complete instructions/literature along with all the possible adverse reactions/side effects for all the Medicines you take and that have been prescribed to you. Take any new Medicines after you have completely understood and accpet all the possible adverse reactions/side effects.   Please note  You were cared for by a hospitalist during your hospital stay. If you have any questions about your discharge medications or the care you received while you were in the hospital after you are discharged, you can call the unit and asked to speak with the hospitalist on call if the hospitalist that took care of you is not available. Once you are discharged, your primary care physician will handle any further medical issues. Please note that NO REFILLS for any discharge medications will be authorized once you are discharged, as it is imperative that you return to your primary care physician (or establish a relationship with a primary care physician if you do not have one) for your aftercare needs so that they can reassess your need for medications and monitor your lab values.    On the day of Discharge:   VITAL SIGNS:  Blood pressure (!) 116/51, pulse 95, temperature 98.2 F (36.8 C), temperature source Oral, resp. rate 16,  height _0  (1.753 m), weight 290 lb 4.8 oz (131.7 kg), SpO2 95 %.  I/O:   Intake/Output Summary (Last 24 hours) at 10/15/15 0929 Last data filed at 10/15/15 0400  Gross per 24 hour  Intake             1200 ml  Output                0 ml  Net             1200 ml    PHYSICAL EXAMINATION:  GENERAL:  82 y.o.-year-old patient lying in the bed with no acute distress.  EYES: Pupils equal, round, reactive to light and accommodation. No scleral icterus. Extraocular muscles intact.  HEENT: Head atraumatic, normocephalic. Oropharynx and nasopharynx clear.  NECK:  Supple, no jugular venous distention. No thyroid enlargement, no tenderness.  LUNGS: Normal breath sounds bilaterally, no wheezing, rales,rhonchi or crepitation. No use of accessory muscles of  respiration.  CARDIOVASCULAR: S1, S2 normal. No murmurs, rubs, or gallops. Trace edema ABDOMEN: Soft, non-tender, non-distended. Bowel sounds present. No organomegaly or mass.  EXTREMITIES: No pedal edema, cyanosis, or clubbing. Left surgical boot in place NEUROLOGIC: Cranial nerves II through XII are intact. Muscle strength 5/5 in all extremities. Sensation intact. Gait not checked.  PSYCHIATRIC: The patient is alert and oriented x 3.  SKIN: No obvious rash, lesion, or ulcer.   DATA REVIEW:   CBC  Recent Labs Lab 10/13/15 0356  WBC 8.0  HGB 13.1  HCT 38.8*  PLT 92*    Chemistries   Recent Labs Lab 10/12/15 1736 10/13/15 0356  NA 140 137  K 4.0 4.1  CL 103 102  CO2 27 26  GLUCOSE 225* 319*  BUN 18 22*  CREATININE 1.16 1.15  CALCIUM 8.5* 8.0*  AST 23  --   ALT 17  --   ALKPHOS 61  --   BILITOT 0.6  --     Cardiac Enzymes  Recent Labs Lab 10/12/15 1736  TROPONINI <0.03    Microbiology Results  No results found for this or any previous visit.  RADIOLOGY:  No results found.   Management plans discussed with the patient, family and they are in agreement.  CODE STATUS:     Code Status Orders         Start     Ordered   10/13/15 0149  Full code  Continuous     10/13/15 0149    Code Status History    Date Active Date Inactive Code Status Order ID Comments User Context   This patient has a current code status but no historical code status.      TOTAL TIME TAKING CARE OF THIS PATIENT: 28 minutes.    Hower,  Karenann Cai.D on 10/15/2015 at 9:29 AM  Between 7am to 6pm - Pager - [phone]  After 6pm go to [email or web address] - Technical brewer [place]  Office  [phone]  CC: Primary care physician; Jason Castle, MD

## 2015-10-15 NOTE — Progress Notes (Signed)
DISCHARGE NOTE:  Pt given discharge instructions and prescriptions. Pt verbalized understanding. EMS called for transportation.

## 2015-10-17 NOTE — Progress Notes (Signed)
   10/14/15 1519  Acute Rehab OT Goals  Patient Stated Goal To return home.  OT Time Calculation  OT Start Time (ACUTE ONLY) 0120  OT Stop Time (ACUTE ONLY) 0147  OT Time Calculation (min) 27 min  OT G-codes **NOT FOR INPATIENT CLASS**  Functional Assessment Tool Used (Clinical judgement based on pt. cureent functional status.)  Functional Limitation Self care  Self Care Current Status (Y7741) CM  Self Care Goal Status (O8786) CK  OT General Charges  $OT Visit 1 Procedure  OT Evaluation  $OT Eval High Complexity 1 Procedure    Late entry G codes entered by Olegario Messier, MS, OTR/L. Assessment performed and documented by Olegario Messier, OT. Olegario Messier, MS, OTR/L

## 2016-01-02 ENCOUNTER — Encounter (INDEPENDENT_AMBULATORY_CARE_PROVIDER_SITE_OTHER): Payer: Self-pay | Admitting: Vascular Surgery

## 2016-01-02 ENCOUNTER — Ambulatory Visit (INDEPENDENT_AMBULATORY_CARE_PROVIDER_SITE_OTHER): Payer: Medicare Other | Admitting: Vascular Surgery

## 2016-01-02 ENCOUNTER — Encounter (INDEPENDENT_AMBULATORY_CARE_PROVIDER_SITE_OTHER): Payer: Self-pay

## 2016-01-02 VITALS — BP 122/57 | HR 94 | Resp 17 | Ht 69.0 in

## 2016-01-02 DIAGNOSIS — E785 Hyperlipidemia, unspecified: Secondary | ICD-10-CM

## 2016-01-02 DIAGNOSIS — E118 Type 2 diabetes mellitus with unspecified complications: Secondary | ICD-10-CM | POA: Diagnosis not present

## 2016-01-02 DIAGNOSIS — L97513 Non-pressure chronic ulcer of other part of right foot with necrosis of muscle: Secondary | ICD-10-CM

## 2016-01-02 DIAGNOSIS — I739 Peripheral vascular disease, unspecified: Secondary | ICD-10-CM

## 2016-01-02 NOTE — Progress Notes (Signed)
Subjective:    Patient ID: Jason [name], male    DOB: 1932-08-09, 82 y.o.   MRN: [id number] Chief Complaint  Patient presents with  . Follow-up   Patient last seen in our office on 07/08/14 for lower extremity pain. At the time, and ABI and venous duplex was ordered however patient did not follow up. He presents today with a chief complaint of "right foot wounds". He is a resident at a nursing home. Accompanied by a nursing home worker. Patient with dementia and is a poor historian. Nursing home worker unable to give any history either. Patient is not ambulatory. Denies any right lower extremity pain. Just pain at the ulceration sites. He has three sites with ulcer formation. Right: 1st toe, 5th toe and a heel ulceration with eschars present. Unable to give me information about how long they have been present or what wound care he has received. Denies any fever, nausea or vomiting.    Review of Systems  Constitutional: Negative.   HENT: Negative.   Eyes: Negative.   Respiratory: Negative.   Cardiovascular: Positive for leg swelling.  Gastrointestinal: Negative.   Endocrine: Negative.   Genitourinary: Negative.   Musculoskeletal: Negative.   Skin: Positive for wound.  Allergic/Immunologic: Negative.   Neurological: Negative.   Hematological: Negative.   Psychiatric/Behavioral: Positive for confusion.      Objective:   Physical Exam  Constitutional: He is oriented to person, place, and time. He appears well-developed and well-nourished.  Obese. In wheelchair.   HENT:  Head: Normocephalic and atraumatic.  Eyes: Conjunctivae and EOM are normal. Pupils are equal, round, and reactive to light.  Neck: Normal range of motion.  Cardiovascular: Normal rate, regular rhythm and normal heart sounds.   Pulses:      Popliteal pulses are 0 on the right side, and 1+ on the left side.       Dorsalis pedis pulses are 0 on the right side, and 1+ on the left side.       Posterior tibial pulses  are 0 on the right side, and 1+ on the left side.  Pulmonary/Chest: Effort normal and breath sounds normal.  Abdominal: Soft. Bowel sounds are normal.  Musculoskeletal: Normal range of motion. He exhibits edema (Bilateral Moderate Edema).  Neurological: He is alert and oriented to person, place, and time.  Skin:  Right 1st Toe: Ulceration present with necrotic tissue in wound. 5th Toe: superficial ulceration present some granulation tissue noted. Heel: large eschar noted.  No infection or cellulitis to the foot noted.   Psychiatric: He has a normal mood and affect. His behavior is normal. Judgment and thought content normal.  Patient with dementia.   BP (!) 122/57   Pulse 94   Resp 17   Ht 5' 9"  (1.753 m)   Past Medical History:  Diagnosis Date  . BPH (benign prostatic hyperplasia)   . Constipation   . COPD (chronic obstructive pulmonary disease) ([place])   . Diabetes mellitus without complication ([place])   . GERD (gastroesophageal reflux disease)   . HTN (hypertension)   . Hypothyroidism   . Stroke [facility])    Social History   Social History  . Marital status: Married    Spouse name: N/A  . Number of children: N/A  . Years of education: N/A   Occupational History  . Not on file.   Social History Main Topics  . Smoking status: Former Research scientist (life sciences)  . Smokeless tobacco: Never Used  . Alcohol use No  .  Drug use: No  . Sexual activity: Not on file   Other Topics Concern  . Not on file   Social History Narrative  . No narrative on file   Past Surgical History:  Procedure Laterality Date  . ROTATOR CUFF REPAIR Left   . THYROID SURGERY     Family History  Problem Relation Age of Onset  . Diabetes Mother   . Thyroid disease Mother   . Pancreatic cancer Father    No Known Allergies     Assessment & Plan:  Patient last seen in our office on 07/08/14 for lower extremity pain. At the time, and ABI and venous duplex was ordered however patient did not follow up. He presents  today with a chief complaint of "right foot wounds". He is a resident at a nursing home. Accompanied by a nursing home worker. Patient with dementia and is a poor historian. Nursing home worker unable to give any history either. Patient is not ambulatory. Denies any right lower extremity pain. Just pain at the ulceration sites. He has three sites with ulcer formation. Right: 1st toe, 5th toe and a heel ulceration with eschars present. Unable to give me information about how long they have been present or what wound care he has received. Denies any fever, nausea or vomiting.   1. Hyperlipidemia, unspecified hyperlipidemia type - Stable On ASA and statin for medical optimization. Encouraged good control as its slows the progression of atherosclerotic disease  2. 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 No palpable right pedal pulses. Recommend right lower extremity angiogram to rule out any PAD contributing to his wound formation. Procedure, risks and benefits explained. All questions answered. Patient wishes to proceed.   4. Type 2 diabetes mellitus with complication, unspecified long term insulin use status ([place]) - Stable Encouraged good control as its slows the progression of atherosclerotic disease and impede wound healing  Current Outpatient Prescriptions on File Prior to Visit  Medication Sig Dispense Refill  . ACCU-CHEK COMPACT PLUS test strip Apply 1 strip topically every morning.    Marland Kitchen ADVAIR DISKUS 250-50 MCG/DOSE AEPB Inhale 1 puff into the lungs 2 (two) times daily.    Marland Kitchen amoxicillin-clavulanate (AUGMENTIN) 875-125 MG tablet Take 1 tablet by mouth every 12 (twelve) hours. (Patient not taking: Reported on 01/02/2016) 10 tablet 0  . aspirin 81 MG tablet Take 81 mg by mouth daily.    . blood glucose meter kit and supplies KIT Dispense based on patient and insurance preference. Use up to four times daily as directed. (FOR ICD-9  250.00, 250.01). 1 each 0  . enalapril (VASOTEC) 20 MG tablet Take 20 mg by mouth daily.    Marland Kitchen FLUoxetine (PROZAC) 40 MG capsule Take 40 mg by mouth daily.    . furosemide (LASIX) 40 MG tablet Take 80 mg by mouth every morning.    . insulin lispro (HUMALOG) 100 UNIT/ML injection Inject 20 Units into the skin 3 (three) times daily with meals.    Marland Kitchen LANTUS SOLOSTAR 100 UNIT/ML Solostar Pen Inject 90-100 Units into the skin 2 (two) times daily. Patient reports he takes Lantus 90 units QAM and Lantus 100 units QHS    . omeprazole (PRILOSEC) 20 MG capsule Take 20 mg by mouth 2 (two) times daily.    Marland Kitchen oxyCODONE (OXY IR/ROXICODONE) 5 MG immediate release tablet Take 1 tablet (5 mg total) by mouth every 4 (four) hours as needed  for moderate pain. 30 tablet 0  . simvastatin (ZOCOR) 20 MG tablet Take 20 mg by mouth at bedtime.    Marland Kitchen SYNTHROID 125 MCG tablet Take 125 mcg by mouth daily.    . tamsulosin (FLOMAX) 0.4 MG CAPS capsule Take 0.4 mg by mouth daily.     No current facility-administered medications on file prior to visit.     There are no Patient Instructions on file for this visit. No Follow-up on file.   Hazelle Woollard A Jennings Stirling, PA-C

## 2016-01-09 ENCOUNTER — Other Ambulatory Visit
Admission: RE | Admit: 2016-01-09 | Discharge: 2016-01-09 | Disposition: A | Discharge status: Home / Self Care | Payer: Medicare Other | Source: Ambulatory Visit | Attending: Vascular Surgery | Admitting: Vascular Surgery

## 2016-01-09 ENCOUNTER — Other Ambulatory Visit (INDEPENDENT_AMBULATORY_CARE_PROVIDER_SITE_OTHER): Payer: Self-pay | Admitting: Vascular Surgery

## 2016-01-09 DIAGNOSIS — Z01812 Encounter for preprocedural laboratory examination: Secondary | ICD-10-CM | POA: Diagnosis present

## 2016-01-09 LAB — CREATININE, SERUM
Creatinine, Ser: 1.34 mg/dL — ABNORMAL HIGH (ref 0.61–1.24)
GFR calc Af Amer: 56 mL/min — ABNORMAL LOW (ref >60)
GFR calc non Af Amer: 48 mL/min — ABNORMAL LOW (ref >60)

## 2016-01-09 LAB — BUN: BUN: 21 mg/dL — ABNORMAL HIGH (ref 6–20)

## 2016-01-10 ENCOUNTER — Ambulatory Visit
Admission: RE | Admit: 2016-01-10 | Discharge: 2016-01-10 | Disposition: A | Discharge status: Skilled Nursing Facility | Payer: Medicare Other | Source: Ambulatory Visit | Attending: Vascular Surgery | Admitting: Vascular Surgery

## 2016-01-10 ENCOUNTER — Encounter
Admission: RE | Disposition: A | Discharge status: Skilled Nursing Facility | Payer: Self-pay | Source: Ambulatory Visit | Attending: Vascular Surgery

## 2016-01-10 DIAGNOSIS — E669 Obesity, unspecified: Secondary | ICD-10-CM | POA: Diagnosis not present

## 2016-01-10 DIAGNOSIS — Z794 Long term (current) use of insulin: Secondary | ICD-10-CM | POA: Diagnosis not present

## 2016-01-10 DIAGNOSIS — E039 Hypothyroidism, unspecified: Secondary | ICD-10-CM | POA: Insufficient documentation

## 2016-01-10 DIAGNOSIS — Z800 Family history of malignant neoplasm of digestive organs: Secondary | ICD-10-CM | POA: Insufficient documentation

## 2016-01-10 DIAGNOSIS — I10 Essential (primary) hypertension: Secondary | ICD-10-CM | POA: Insufficient documentation

## 2016-01-10 DIAGNOSIS — E785 Hyperlipidemia, unspecified: Secondary | ICD-10-CM | POA: Diagnosis not present

## 2016-01-10 DIAGNOSIS — K219 Gastro-esophageal reflux disease without esophagitis: Secondary | ICD-10-CM | POA: Insufficient documentation

## 2016-01-10 DIAGNOSIS — L97513 Non-pressure chronic ulcer of other part of right foot with necrosis of muscle: Secondary | ICD-10-CM | POA: Diagnosis not present

## 2016-01-10 DIAGNOSIS — N400 Enlarged prostate without lower urinary tract symptoms: Secondary | ICD-10-CM | POA: Diagnosis not present

## 2016-01-10 DIAGNOSIS — Z7982 Long term (current) use of aspirin: Secondary | ICD-10-CM | POA: Insufficient documentation

## 2016-01-10 DIAGNOSIS — Z87891 Personal history of nicotine dependence: Secondary | ICD-10-CM | POA: Insufficient documentation

## 2016-01-10 DIAGNOSIS — E119 Type 2 diabetes mellitus without complications: Secondary | ICD-10-CM | POA: Diagnosis not present

## 2016-01-10 DIAGNOSIS — I70238 Atherosclerosis of native arteries of right leg with ulceration of other part of lower right leg: Secondary | ICD-10-CM

## 2016-01-10 DIAGNOSIS — F0390 Unspecified dementia without behavioral disturbance: Secondary | ICD-10-CM | POA: Diagnosis not present

## 2016-01-10 DIAGNOSIS — Z993 Dependence on wheelchair: Secondary | ICD-10-CM | POA: Diagnosis not present

## 2016-01-10 DIAGNOSIS — I7025 Atherosclerosis of native arteries of other extremities with ulceration: Secondary | ICD-10-CM | POA: Insufficient documentation

## 2016-01-10 DIAGNOSIS — J449 Chronic obstructive pulmonary disease, unspecified: Secondary | ICD-10-CM | POA: Insufficient documentation

## 2016-01-10 DIAGNOSIS — Z833 Family history of diabetes mellitus: Secondary | ICD-10-CM | POA: Insufficient documentation

## 2016-01-10 HISTORY — PX: PERIPHERAL VASCULAR CATHETERIZATION: SHX172C

## 2016-01-10 SURGERY — LOWER EXTREMITY ANGIOGRAPHY
Anesthesia: Moderate Sedation | Site: Leg Lower | Laterality: Right

## 2016-01-10 MED ORDER — FENTANYL CITRATE (PF) 100 MCG/2ML IJ SOLN
INTRAMUSCULAR | Status: AC
  Filled 2016-01-10: qty 2

## 2016-01-10 MED ORDER — SODIUM CHLORIDE 0.9 % IV SOLN
Freq: Once | INTRAVENOUS | Status: AC
  Administered 2016-01-10: 10:00:00 via INTRAVENOUS

## 2016-01-10 MED ORDER — HEPARIN SODIUM (PORCINE) 1000 UNIT/ML IJ SOLN
INTRAMUSCULAR | Status: DC | PRN
  Administered 2016-01-10: 1000 [IU] via INTRAVENOUS
  Administered 2016-01-10: 4000 [IU] via INTRAVENOUS

## 2016-01-10 MED ORDER — IOPAMIDOL (ISOVUE-300) INJECTION 61%
INTRAVENOUS | Status: DC | PRN
  Administered 2016-01-10: 95 mL via INTRA_ARTERIAL

## 2016-01-10 MED ORDER — DIPHENHYDRAMINE HCL 50 MG/ML IJ SOLN
INTRAMUSCULAR | Status: AC
  Filled 2016-01-10: qty 1

## 2016-01-10 MED ORDER — OXYCODONE HCL 5 MG PO TABS
5.0000 mg | ORAL_TABLET | ORAL | Status: DC | PRN
  Filled 2016-01-10: qty 2

## 2016-01-10 MED ORDER — HEPARIN SOD (PORK) LOCK FLUSH 100 UNIT/ML IV SOLN
INTRAVENOUS | Status: AC
  Filled 2016-01-10: qty 5

## 2016-01-10 MED ORDER — ACETAMINOPHEN 325 MG PO TABS: 325.0000 mg | ORAL_TABLET | ORAL | Status: DC | PRN

## 2016-01-10 MED ORDER — HEPARIN SODIUM (PORCINE) 1000 UNIT/ML IJ SOLN
INTRAMUSCULAR | Status: AC
  Filled 2016-01-10: qty 1

## 2016-01-10 MED ORDER — PANTOPRAZOLE SODIUM 40 MG PO TBEC: 40.0000 mg | DELAYED_RELEASE_TABLET | Freq: Every day | ORAL | Status: DC

## 2016-01-10 MED ORDER — MIDAZOLAM HCL 2 MG/2ML IJ SOLN
INTRAMUSCULAR | Status: DC | PRN
  Administered 2016-01-10: 2 mg via INTRAVENOUS
  Administered 2016-01-10 (×3): 1 mg via INTRAVENOUS

## 2016-01-10 MED ORDER — MIDAZOLAM HCL 5 MG/5ML IJ SOLN
INTRAMUSCULAR | Status: AC
  Filled 2016-01-10: qty 5

## 2016-01-10 MED ORDER — ALUM & MAG HYDROXIDE-SIMETH 200-200-20 MG/5ML PO SUSP: 15.0000 mL | ORAL | Status: DC | PRN

## 2016-01-10 MED ORDER — CLOPIDOGREL BISULFATE 75 MG PO TABS
300.0000 mg | ORAL_TABLET | ORAL | Status: AC
  Administered 2016-01-10: 300 mg via ORAL

## 2016-01-10 MED ORDER — LIDOCAINE HCL (PF) 1 % IJ SOLN
INTRAMUSCULAR | Status: AC
  Filled 2016-01-10: qty 30

## 2016-01-10 MED ORDER — FAMOTIDINE 20 MG PO TABS: 40.0000 mg | ORAL_TABLET | ORAL | Status: DC | PRN

## 2016-01-10 MED ORDER — CLOPIDOGREL BISULFATE 75 MG PO TABS
ORAL_TABLET | ORAL | Status: AC
  Filled 2016-01-10: qty 4

## 2016-01-10 MED ORDER — METHYLPREDNISOLONE SODIUM SUCC 125 MG IJ SOLR: 125.0000 mg | INTRAMUSCULAR | Status: DC | PRN

## 2016-01-10 MED ORDER — MORPHINE SULFATE (PF) 4 MG/ML IV SOLN: 2.0000 mg | INTRAVENOUS | Status: DC | PRN

## 2016-01-10 MED ORDER — SODIUM CHLORIDE 0.9 % IV SOLN: INTRAVENOUS | Status: DC

## 2016-01-10 MED ORDER — DOCUSATE SODIUM 100 MG PO CAPS: 100.0000 mg | ORAL_CAPSULE | Freq: Every day | ORAL | Status: DC

## 2016-01-10 MED ORDER — METOPROLOL TARTRATE 5 MG/5ML IV SOLN: 5.0000 mg | Freq: Four times a day (QID) | INTRAVENOUS | Status: DC

## 2016-01-10 MED ORDER — DEXTROSE 5 % IV SOLN
1.5000 g | INTRAVENOUS | Status: AC
  Administered 2016-01-10: 1.5 g via INTRAVENOUS

## 2016-01-10 MED ORDER — FENTANYL CITRATE (PF) 100 MCG/2ML IJ SOLN
INTRAMUSCULAR | Status: DC | PRN
  Administered 2016-01-10 (×2): 50 ug via INTRAVENOUS
  Administered 2016-01-10: 25 ug via INTRAVENOUS
  Administered 2016-01-10: 50 ug via INTRAVENOUS
  Administered 2016-01-10: 25 ug via INTRAVENOUS

## 2016-01-10 MED ORDER — ONDANSETRON HCL 4 MG/2ML IJ SOLN: 4.0000 mg | Freq: Four times a day (QID) | INTRAMUSCULAR | Status: DC | PRN

## 2016-01-10 MED ORDER — HEPARIN (PORCINE) IN NACL 2-0.9 UNIT/ML-% IJ SOLN
INTRAMUSCULAR | Status: AC
  Filled 2016-01-10: qty 1000

## 2016-01-10 MED ORDER — HYDRALAZINE HCL 20 MG/ML IJ SOLN: 5.0000 mg | INTRAMUSCULAR | Status: DC | PRN

## 2016-01-10 MED ORDER — ACETAMINOPHEN 325 MG RE SUPP
325.0000 mg | RECTAL | Status: DC | PRN
  Filled 2016-01-10: qty 2

## 2016-01-10 MED ORDER — DIPHENHYDRAMINE HCL 50 MG/ML IJ SOLN
INTRAMUSCULAR | Status: DC | PRN
  Administered 2016-01-10: 50 mg via INTRAVENOUS

## 2016-01-10 MED ORDER — HYDROMORPHONE HCL 1 MG/ML IJ SOLN: 1.0000 mg | Freq: Once | INTRAMUSCULAR | Status: DC

## 2016-01-10 MED ORDER — LABETALOL HCL 5 MG/ML IV SOLN: 10.0000 mg | INTRAVENOUS | Status: DC | PRN

## 2016-01-10 MED ORDER — CLOPIDOGREL BISULFATE 75 MG PO TABS: 75.0000 mg | ORAL_TABLET | Freq: Every day | ORAL | 5 refills | Status: AC

## 2016-01-10 SURGICAL SUPPLY — 30 items
BALLN ARMADA 3.0X120X150 (BALLOONS) ×2
BALLN ARMADA 3X120X150 (BALLOONS) ×3
BALLN LUTONIX 5X150X130 (BALLOONS) ×5
BALLN LUTONIX DCB 4X80X130 (BALLOONS) ×5
BALLN LUTONIX DCB 5X60X130 (BALLOONS) ×5
BALLOON ARMADA 3X120X150 (BALLOONS) ×3 IMPLANT
BALLOON LUTONIX 5X150X130 (BALLOONS) ×3 IMPLANT
BALLOON LUTONIX DCB 4X80X130 (BALLOONS) ×3 IMPLANT
BALLOON LUTONIX DCB 5X60X130 (BALLOONS) ×3 IMPLANT
CATH GWIRE MARINER STRGHT 4FR (CATHETERS) ×5 IMPLANT
CATH PIG 70CM (CATHETERS) ×5 IMPLANT
DEVICE PRESTO INFLATION (MISCELLANEOUS) ×5 IMPLANT
DEVICE STARCLOSE SE CLOSURE (Vascular Products) ×5 IMPLANT
DEVICE TORQUE (MISCELLANEOUS) ×5 IMPLANT
GLIDECATH ANGLED 4FR 120CM (CATHETERS) ×5 IMPLANT
GLIDEWIRE ANGLED SS 035X260CM (WIRE) ×5 IMPLANT
GUIDEWIRE PFTE-COATED .018X300 (WIRE) ×5 IMPLANT
GUIDEWIRE SUPER STIFF .035X180 (WIRE) ×5 IMPLANT
NEEDLE ENTRY 21GA 7CM ECHOTIP (NEEDLE) ×5 IMPLANT
PACK ANGIOGRAPHY (CUSTOM PROCEDURE TRAY) ×5 IMPLANT
SET INTRO CAPELLA COAXIAL (SET/KITS/TRAYS/PACK) ×5 IMPLANT
SHEATH BRITE TIP 5FRX11 (SHEATH) ×5 IMPLANT
SHEATH RAABE 6FR (SHEATH) ×5 IMPLANT
SHIELD RADPAD SCOOP 12X17 (MISCELLANEOUS) ×5 IMPLANT
SYR MEDRAD MARK V 150ML (SYRINGE) ×5 IMPLANT
TUBING CONTRAST HIGH PRESS 72 (TUBING) ×5 IMPLANT
WIRE AMPLATZ SSTIFF .035X260CM (WIRE) ×5 IMPLANT
WIRE G V18X300CM (WIRE) ×5 IMPLANT
WIRE HI TORQ VERSACORE 300 (WIRE) ×5 IMPLANT
WIRE J 3MM .035X145CM (WIRE) ×5 IMPLANT

## 2016-01-10 NOTE — Op Note (Signed)
[name] Percutaneous Study/Intervention Procedural Note   Date of Surgery: 01/10/2016  Surgeon: Jason [name]  Pre-operative Diagnosis: Atherosclerotic occlusive disease bilateral lower extremities with ulceration of the right heel.  Post-operative diagnosis: Same  Procedure(s) Performed: 1. Introduction catheter into right lower extremity 3rd order catheter placement with additional third order placement  2. Contrast injection right lower extremity for distal runoff  3. Percutaneous transluminal angioplasty right superficial femoral and popliteal arteries to 5 mm with Lutonix 4. Percutaneous transluminal angioplasty right perineal artery to 3 mm             5.  Percutaneous transluminal and plasty right anterior tibial artery to 3 mm  6. Star close closure left common femoral arteriotomy  Anesthesia: Conscious sedation was administered under my direct supervision. IV Versed plus fentanyl were utilized. Continuous ECG, pulse oximetry and blood pressure was monitored throughout the entire procedure. Conscious sedation was  for a total of 1 hour 50 minutes.  Sheath: 6 French Rabi left common femoral  Contrast: 95 cc  Fluoroscopy Time: 11.6 minutes  Indications: Jason [name] presents with increasing pain associated with ulceration of the right lower extremity. This suggests the patient is having limb threatening ischemia. The risks and benefits are reviewed all questions answered patient agrees to proceed.  Procedure:Jason [name] is a 82 y.o. y.o. male who was identified and appropriate procedural time out was performed. The patient was then placed supine on the table and prepped and draped in the usual sterile fashion.   Ultrasound was placed in the sterile sleeve and the left groin was evaluated the left common femoral artery was echolucent and pulsatile indicating patency.  Image was recorded for the permanent record and under real-time visualization a microneedle was inserted into the common femoral artery microwire followed by a micro-sheath. A J-wire was then advanced through the micro-sheath and a 5 [place] sheath was then inserted over a J-wire. J-wire was then advanced and a 5 French pigtail catheter was positioned at the level of T12.  AP projection of the aorta was then obtained. Pigtail catheter was repositioned to above the bifurcation and a LAO view of the pelvis was obtained. Subsequently a pigtail catheter with the stiff angle Glidewire was used to cross the aortic bifurcation the catheter wire were advanced down into the right distal external iliac artery. Oblique view of the femoral bifurcation was then obtained and subsequently the wire was reintroduced and the pigtail catheter negotiated into the SFA representing third order catheter placement. Distal runoff was then performed.  5000 units of heparin was then given and allowed to circulate and a 6 [place] Rabi sheath was advanced up and over the bifurcation and positioned in the femoral artery  Straight catheter and stiff angle Glidewire were then negotiated down into the distal popliteal. Catheter was then advanced. Hand injection contrast demonstrated the tibial anatomy in detail.    The wire and catheter was then negotiated into the peroneal. Hand injection contrast was performed and a 3 mm x 15 cm balloon was used to angioplasty the peroneal. Each inflation was for 2 minutes at 12 atm. Follow-up imaging demonstrated excellent patency within the peroneal. Attention was then turned to the anterior tibial. The angled glide catheter was reintroduced magnified image of the origin of the anterior tibial was obtained and the V-18 wire and catheter was negotiated into the anterior tibial down to its distal one third. Hand injection contrast demonstrated the anterior tibial anatomy distally. A 3  x 15 balloon was then  advanced over the wire and serial inflation was performed. Each inflation was to 10-12 atm for 1 minute. Follow-up imaging demonstrated excellent result within the anterior tibial. Attention was then turned to the lesion in the popliteal and distal SFA.   The detector was then repositioned and the SFA and popliteal was reimaged greater than 95% stenosis is noted in this area and after appropriate measurements are made a 4 x 80 Lutonix balloon was used to angioplasty the distal popliteal. The inflation was to 10 atm for 2 minutes. Subsequently, a 5 x 15 Lutonix balloon and then a 5 x 60 Lutonix balloon were used to treat the distal superficial femoral artery. Inflations were to 12 atmospheres for 2 minutes. Follow-up imaging demonstrated patency with excellent result. Distal runoff was then reassessed and noted to be widely patent.   After review of these images the sheath is pulled into the left external iliac oblique of the common femoral is obtained and a Star close device deployed. There no immediate Complications.  Findings: The abdominal aorta is opacified with a bolus injection contrast. Renal arteries are patent. The aorta itself has diffuse disease but no hemodynamically significant lesions. The common and external iliac arteries are widely patent bilaterally.  The right common femoral is widely patent as is the profunda femoris.  The SFA does indeed have a significant stenosis with hemodynamically significant lesions noted at Hunter's canal and extending into the popliteal.  The distal popliteal demonstrates increasing disease with a string sign noted at the level of the tibial plateau and the trifurcation is heavily diseased with occlusion of the posterior tibial. The anterior tibial and peroneal demonstrates a subtotal occlusion in there proximal portion.  Following angioplasty anterior tibial and peroneal are now patent with in-line flow and looks quite nice. Angioplasty of the SFA at  Hunter's canal yields an excellent result with less than 10% residual stenosis.  Summary: Successful recanalization right lower cavity for limb salvage   Disposition: Patient was taken to the recovery room in stable condition having tolerated the procedure well.  Belenda Cruise Schnier 01/10/2016,4:00 PM

## 2016-01-10 NOTE — Discharge Instructions (Signed)

## 2016-01-10 NOTE — Op Note (Deleted)
[facility] History & Physical Update  The patient was interviewed and re-examined.  The patient's previous History and Physical has been reviewed and is unchanged.  There is no change in the plan of care. We plan to proceed with the scheduled procedure.  Levora DredgeGregory Deontre Allsup, MD  01/10/2016, 12:02 PM

## 2016-01-10 NOTE — H&P (Signed)
[facility] History & Physical Update  The patient was interviewed and re-examined.  The patient's previous History and Physical has been reviewed and is unchanged.  There is no change in the plan of care. We plan to proceed with the scheduled procedure.  Levora DredgeGregory Schnier, MD  01/10/2016, 12:08 PM

## 2016-01-11 ENCOUNTER — Encounter: Payer: Self-pay | Admitting: Vascular Surgery

## 2016-01-20 ENCOUNTER — Telehealth (INDEPENDENT_AMBULATORY_CARE_PROVIDER_SITE_OTHER): Payer: Self-pay

## 2016-01-20 NOTE — Telephone Encounter (Signed)
Patient's wife called wanting to know if her husband could be seen sooner than 02/01/16. I let her know that we could bring him in but if he has an ultrasound attached to his visit we would not be able to move that up. She states she is concerned about his toe being black and the fact he has been on antibiotics for quite some time. The patient had an angio 01/10/16, she feels as if the skilled nursing facility he is at is not giving them the information they need regarding her husband.

## 2016-01-25 ENCOUNTER — Inpatient Hospital Stay
Admission: AD | Admit: 2016-01-25 | Discharge: 2016-02-14 | DRG: 616 | Disposition: A | Discharge status: Skilled Nursing Facility | Payer: Medicare Other | Source: Ambulatory Visit | Attending: Internal Medicine | Admitting: Internal Medicine

## 2016-01-25 ENCOUNTER — Encounter: Payer: Self-pay | Admitting: Internal Medicine

## 2016-01-25 DIAGNOSIS — I96 Gangrene, not elsewhere classified: Secondary | ICD-10-CM | POA: Diagnosis present

## 2016-01-25 DIAGNOSIS — I214 Non-ST elevation (NSTEMI) myocardial infarction: Secondary | ICD-10-CM | POA: Diagnosis not present

## 2016-01-25 DIAGNOSIS — J810 Acute pulmonary edema: Secondary | ICD-10-CM | POA: Diagnosis not present

## 2016-01-25 DIAGNOSIS — E1169 Type 2 diabetes mellitus with other specified complication: Principal | ICD-10-CM | POA: Diagnosis present

## 2016-01-25 DIAGNOSIS — Z794 Long term (current) use of insulin: Secondary | ICD-10-CM

## 2016-01-25 DIAGNOSIS — E1165 Type 2 diabetes mellitus with hyperglycemia: Secondary | ICD-10-CM | POA: Diagnosis present

## 2016-01-25 DIAGNOSIS — Z87891 Personal history of nicotine dependence: Secondary | ICD-10-CM

## 2016-01-25 DIAGNOSIS — J449 Chronic obstructive pulmonary disease, unspecified: Secondary | ICD-10-CM | POA: Diagnosis present

## 2016-01-25 DIAGNOSIS — I5033 Acute on chronic diastolic (congestive) heart failure: Secondary | ICD-10-CM | POA: Diagnosis not present

## 2016-01-25 DIAGNOSIS — R7989 Other specified abnormal findings of blood chemistry: Secondary | ICD-10-CM

## 2016-01-25 DIAGNOSIS — Z8349 Family history of other endocrine, nutritional and metabolic diseases: Secondary | ICD-10-CM

## 2016-01-25 DIAGNOSIS — E1122 Type 2 diabetes mellitus with diabetic chronic kidney disease: Secondary | ICD-10-CM | POA: Diagnosis present

## 2016-01-25 DIAGNOSIS — T17908A Unspecified foreign body in respiratory tract, part unspecified causing other injury, initial encounter: Secondary | ICD-10-CM

## 2016-01-25 DIAGNOSIS — Z7189 Other specified counseling: Secondary | ICD-10-CM

## 2016-01-25 DIAGNOSIS — E1140 Type 2 diabetes mellitus with diabetic neuropathy, unspecified: Secondary | ICD-10-CM | POA: Diagnosis present

## 2016-01-25 DIAGNOSIS — R110 Nausea: Secondary | ICD-10-CM

## 2016-01-25 DIAGNOSIS — I130 Hypertensive heart and chronic kidney disease with heart failure and stage 1 through stage 4 chronic kidney disease, or unspecified chronic kidney disease: Secondary | ICD-10-CM | POA: Diagnosis present

## 2016-01-25 DIAGNOSIS — R778 Other specified abnormalities of plasma proteins: Secondary | ICD-10-CM

## 2016-01-25 DIAGNOSIS — M86171 Other acute osteomyelitis, right ankle and foot: Secondary | ICD-10-CM | POA: Diagnosis present

## 2016-01-25 DIAGNOSIS — E118 Type 2 diabetes mellitus with unspecified complications: Secondary | ICD-10-CM

## 2016-01-25 DIAGNOSIS — L97513 Non-pressure chronic ulcer of other part of right foot with necrosis of muscle: Secondary | ICD-10-CM | POA: Diagnosis present

## 2016-01-25 DIAGNOSIS — J9601 Acute respiratory failure with hypoxia: Secondary | ICD-10-CM | POA: Diagnosis not present

## 2016-01-25 DIAGNOSIS — I249 Acute ischemic heart disease, unspecified: Secondary | ICD-10-CM | POA: Diagnosis present

## 2016-01-25 DIAGNOSIS — R079 Chest pain, unspecified: Secondary | ICD-10-CM | POA: Diagnosis not present

## 2016-01-25 DIAGNOSIS — Z7982 Long term (current) use of aspirin: Secondary | ICD-10-CM

## 2016-01-25 DIAGNOSIS — R262 Difficulty in walking, not elsewhere classified: Secondary | ICD-10-CM

## 2016-01-25 DIAGNOSIS — R0902 Hypoxemia: Secondary | ICD-10-CM

## 2016-01-25 DIAGNOSIS — N183 Chronic kidney disease, stage 3 (moderate): Secondary | ICD-10-CM | POA: Diagnosis present

## 2016-01-25 DIAGNOSIS — Z95828 Presence of other vascular implants and grafts: Secondary | ICD-10-CM

## 2016-01-25 DIAGNOSIS — M6281 Muscle weakness (generalized): Secondary | ICD-10-CM

## 2016-01-25 DIAGNOSIS — E1152 Type 2 diabetes mellitus with diabetic peripheral angiopathy with gangrene: Secondary | ICD-10-CM | POA: Diagnosis present

## 2016-01-25 DIAGNOSIS — K219 Gastro-esophageal reflux disease without esophagitis: Secondary | ICD-10-CM | POA: Diagnosis present

## 2016-01-25 DIAGNOSIS — L97419 Non-pressure chronic ulcer of right heel and midfoot with unspecified severity: Secondary | ICD-10-CM | POA: Diagnosis present

## 2016-01-25 DIAGNOSIS — Z8673 Personal history of transient ischemic attack (TIA), and cerebral infarction without residual deficits: Secondary | ICD-10-CM | POA: Diagnosis not present

## 2016-01-25 DIAGNOSIS — R000 Tachycardia, unspecified: Secondary | ICD-10-CM

## 2016-01-25 DIAGNOSIS — R0602 Shortness of breath: Secondary | ICD-10-CM

## 2016-01-25 DIAGNOSIS — Z79891 Long term (current) use of opiate analgesic: Secondary | ICD-10-CM

## 2016-01-25 DIAGNOSIS — Z515 Encounter for palliative care: Secondary | ICD-10-CM

## 2016-01-25 DIAGNOSIS — T447X5A Adverse effect of beta-adrenoreceptor antagonists, initial encounter: Secondary | ICD-10-CM | POA: Diagnosis not present

## 2016-01-25 DIAGNOSIS — N179 Acute kidney failure, unspecified: Secondary | ICD-10-CM | POA: Diagnosis not present

## 2016-01-25 DIAGNOSIS — T463X5A Adverse effect of coronary vasodilators, initial encounter: Secondary | ICD-10-CM | POA: Diagnosis not present

## 2016-01-25 DIAGNOSIS — E876 Hypokalemia: Secondary | ICD-10-CM | POA: Diagnosis present

## 2016-01-25 DIAGNOSIS — I350 Nonrheumatic aortic (valve) stenosis: Secondary | ICD-10-CM | POA: Diagnosis present

## 2016-01-25 DIAGNOSIS — E11621 Type 2 diabetes mellitus with foot ulcer: Secondary | ICD-10-CM | POA: Diagnosis present

## 2016-01-25 DIAGNOSIS — E039 Hypothyroidism, unspecified: Secondary | ICD-10-CM | POA: Diagnosis present

## 2016-01-25 DIAGNOSIS — M869 Osteomyelitis, unspecified: Secondary | ICD-10-CM

## 2016-01-25 DIAGNOSIS — Z7902 Long term (current) use of antithrombotics/antiplatelets: Secondary | ICD-10-CM

## 2016-01-25 DIAGNOSIS — Z800 Family history of malignant neoplasm of digestive organs: Secondary | ICD-10-CM

## 2016-01-25 DIAGNOSIS — J9690 Respiratory failure, unspecified, unspecified whether with hypoxia or hypercapnia: Secondary | ICD-10-CM

## 2016-01-25 DIAGNOSIS — M79671 Pain in right foot: Secondary | ICD-10-CM | POA: Diagnosis present

## 2016-01-25 DIAGNOSIS — Z79899 Other long term (current) drug therapy: Secondary | ICD-10-CM

## 2016-01-25 DIAGNOSIS — Z66 Do not resuscitate: Secondary | ICD-10-CM | POA: Diagnosis not present

## 2016-01-25 DIAGNOSIS — T501X5A Adverse effect of loop [high-ceiling] diuretics, initial encounter: Secondary | ICD-10-CM | POA: Diagnosis not present

## 2016-01-25 DIAGNOSIS — I952 Hypotension due to drugs: Secondary | ICD-10-CM | POA: Diagnosis not present

## 2016-01-25 DIAGNOSIS — E11628 Type 2 diabetes mellitus with other skin complications: Secondary | ICD-10-CM | POA: Diagnosis present

## 2016-01-25 DIAGNOSIS — Z833 Family history of diabetes mellitus: Secondary | ICD-10-CM

## 2016-01-25 LAB — CREATININE, SERUM
Creatinine, Ser: 1.23 mg/dL (ref 0.61–1.24)
GFR calc Af Amer: >60 mL/min (ref >60)
GFR, EST NON AFRICAN AMERICAN: 53 mL/min — AB (ref >60)

## 2016-01-25 LAB — MRSA PCR SCREENING: MRSA by PCR: NEGATIVE

## 2016-01-25 LAB — GLUCOSE, CAPILLARY
GLUCOSE-CAPILLARY: 251 mg/dL — AB (ref 65–99)
Glucose-Capillary: 328 mg/dL — ABNORMAL HIGH (ref 65–99)

## 2016-01-25 MED ORDER — ACETAMINOPHEN 325 MG PO TABS
650.0000 mg | ORAL_TABLET | Freq: Four times a day (QID) | ORAL | Status: DC | PRN
  Administered 2016-01-28: 650 mg via ORAL
  Filled 2016-01-25: qty 2

## 2016-01-25 MED ORDER — ACETAMINOPHEN 650 MG RE SUPP: 650.0000 mg | Freq: Four times a day (QID) | RECTAL | Status: DC | PRN

## 2016-01-25 MED ORDER — FUROSEMIDE 40 MG PO TABS
80.0000 mg | ORAL_TABLET | ORAL | Status: DC
  Administered 2016-01-26: 80 mg via ORAL
  Filled 2016-01-25: qty 2

## 2016-01-25 MED ORDER — OXYCODONE HCL 5 MG PO TABS
5.0000 mg | ORAL_TABLET | ORAL | Status: DC | PRN
  Administered 2016-01-29 – 2016-02-13 (×13): 5 mg via ORAL
  Filled 2016-01-25 (×15): qty 1

## 2016-01-25 MED ORDER — INSULIN GLARGINE 100 UNIT/ML ~~LOC~~ SOLN
90.0000 [IU] | Freq: Every day | SUBCUTANEOUS | Status: DC
  Administered 2016-01-25: 90 [IU] via SUBCUTANEOUS
  Filled 2016-01-25 (×2): qty 0.9

## 2016-01-25 MED ORDER — INSULIN ASPART 100 UNIT/ML ~~LOC~~ SOLN: 0.0000 [IU] | Freq: Three times a day (TID) | SUBCUTANEOUS | Status: DC

## 2016-01-25 MED ORDER — MOMETASONE FURO-FORMOTEROL FUM 200-5 MCG/ACT IN AERO
2.0000 | INHALATION_SPRAY | Freq: Two times a day (BID) | RESPIRATORY_TRACT | Status: DC
  Administered 2016-01-25 – 2016-02-14 (×39): 2 via RESPIRATORY_TRACT
  Filled 2016-01-25 (×2): qty 8.8

## 2016-01-25 MED ORDER — SIMVASTATIN 20 MG PO TABS
20.0000 mg | ORAL_TABLET | Freq: Every day | ORAL | Status: DC
  Administered 2016-01-25 – 2016-02-13 (×20): 20 mg via ORAL
  Filled 2016-01-25 (×20): qty 1

## 2016-01-25 MED ORDER — FLUOXETINE HCL 20 MG PO CAPS
40.0000 mg | ORAL_CAPSULE | Freq: Every day | ORAL | Status: DC
  Administered 2016-01-26 – 2016-02-14 (×18): 40 mg via ORAL
  Filled 2016-01-25 (×22): qty 2

## 2016-01-25 MED ORDER — ASPIRIN EC 81 MG PO TBEC
81.0000 mg | DELAYED_RELEASE_TABLET | Freq: Every day | ORAL | Status: DC
  Administered 2016-01-25 – 2016-02-14 (×18): 81 mg via ORAL
  Filled 2016-01-25 (×20): qty 1

## 2016-01-25 MED ORDER — ENALAPRIL MALEATE 10 MG PO TABS
20.0000 mg | ORAL_TABLET | Freq: Every day | ORAL | Status: DC
  Administered 2016-01-25 – 2016-01-28 (×5): 20 mg via ORAL
  Filled 2016-01-25 (×5): qty 2

## 2016-01-25 MED ORDER — PIPERACILLIN-TAZOBACTAM 3.375 G IVPB
3.3750 g | Freq: Once | INTRAVENOUS | Status: AC
  Administered 2016-01-25: 3.375 g via INTRAVENOUS
  Filled 2016-01-25: qty 50

## 2016-01-25 MED ORDER — LEVOTHYROXINE SODIUM 25 MCG PO TABS
125.0000 ug | ORAL_TABLET | Freq: Every day | ORAL | Status: DC
  Administered 2016-01-26 – 2016-02-14 (×19): 125 ug via ORAL
  Filled 2016-01-25 (×20): qty 1

## 2016-01-25 MED ORDER — PIPERACILLIN-TAZOBACTAM 3.375 G IVPB: 3.3750 g | Freq: Three times a day (TID) | INTRAVENOUS | Status: DC

## 2016-01-25 MED ORDER — PIPERACILLIN-TAZOBACTAM 3.375 G IVPB
3.3750 g | Freq: Three times a day (TID) | INTRAVENOUS | Status: DC
  Administered 2016-01-26 – 2016-01-29 (×10): 3.375 g via INTRAVENOUS
  Filled 2016-01-25 (×10): qty 50

## 2016-01-25 MED ORDER — PANTOPRAZOLE SODIUM 40 MG PO TBEC
40.0000 mg | DELAYED_RELEASE_TABLET | Freq: Every day | ORAL | Status: DC
  Administered 2016-01-25 – 2016-02-14 (×19): 40 mg via ORAL
  Filled 2016-01-25 (×20): qty 1

## 2016-01-25 MED ORDER — VANCOMYCIN HCL IN DEXTROSE 1-5 GM/200ML-% IV SOLN: 1000.0000 mg | INTRAVENOUS | Status: DC

## 2016-01-25 MED ORDER — INSULIN ASPART 100 UNIT/ML ~~LOC~~ SOLN
0.0000 [IU] | Freq: Three times a day (TID) | SUBCUTANEOUS | Status: DC
  Administered 2016-01-25: 11 [IU] via SUBCUTANEOUS
  Administered 2016-01-26 – 2016-01-27 (×2): 5 [IU] via SUBCUTANEOUS
  Administered 2016-01-27: 2 [IU] via SUBCUTANEOUS
  Administered 2016-01-28: 11 [IU] via SUBCUTANEOUS
  Administered 2016-01-28 – 2016-01-29 (×2): 5 [IU] via SUBCUTANEOUS
  Administered 2016-01-29 – 2016-01-30 (×4): 11 [IU] via SUBCUTANEOUS
  Administered 2016-01-31 (×2): 8 [IU] via SUBCUTANEOUS
  Administered 2016-01-31 – 2016-02-01 (×2): 15 [IU] via SUBCUTANEOUS
  Administered 2016-02-01 – 2016-02-02 (×3): 11 [IU] via SUBCUTANEOUS
  Administered 2016-02-02: 8 [IU] via SUBCUTANEOUS
  Administered 2016-02-02: 11 [IU] via SUBCUTANEOUS
  Administered 2016-02-03 – 2016-02-04 (×5): 8 [IU] via SUBCUTANEOUS
  Administered 2016-02-04: 5 [IU] via SUBCUTANEOUS
  Administered 2016-02-05 (×2): 8 [IU] via SUBCUTANEOUS
  Administered 2016-02-05: 11 [IU] via SUBCUTANEOUS
  Administered 2016-02-06: 5 [IU] via SUBCUTANEOUS
  Administered 2016-02-06: 3 [IU] via SUBCUTANEOUS
  Administered 2016-02-06 – 2016-02-07 (×2): 5 [IU] via SUBCUTANEOUS
  Administered 2016-02-07: 3 [IU] via SUBCUTANEOUS
  Administered 2016-02-07: 5 [IU] via SUBCUTANEOUS
  Administered 2016-02-08: 2 [IU] via SUBCUTANEOUS
  Administered 2016-02-08: 5 [IU] via SUBCUTANEOUS
  Administered 2016-02-08 – 2016-02-09 (×2): 3 [IU] via SUBCUTANEOUS
  Administered 2016-02-10: 2 [IU] via SUBCUTANEOUS
  Administered 2016-02-10 (×2): 5 [IU] via SUBCUTANEOUS
  Administered 2016-02-11: 11 [IU] via SUBCUTANEOUS
  Administered 2016-02-11 (×2): 8 [IU] via SUBCUTANEOUS
  Administered 2016-02-12 (×2): 5 [IU] via SUBCUTANEOUS
  Administered 2016-02-12: 11 [IU] via SUBCUTANEOUS
  Administered 2016-02-13 – 2016-02-14 (×4): 8 [IU] via SUBCUTANEOUS
  Administered 2016-02-14: 5 [IU] via SUBCUTANEOUS
  Filled 2016-01-25 (×3): qty 5
  Filled 2016-01-25: qty 11
  Filled 2016-01-25 (×2): qty 5
  Filled 2016-01-25: qty 8
  Filled 2016-01-25 (×2): qty 5
  Filled 2016-01-25: qty 11
  Filled 2016-01-25: qty 8
  Filled 2016-01-25: qty 11
  Filled 2016-01-25: qty 2
  Filled 2016-01-25: qty 15
  Filled 2016-01-25: qty 5
  Filled 2016-01-25: qty 8
  Filled 2016-01-25: qty 15
  Filled 2016-01-25 (×2): qty 11
  Filled 2016-01-25 (×2): qty 8
  Filled 2016-01-25: qty 12
  Filled 2016-01-25: qty 8
  Filled 2016-01-25: qty 3
  Filled 2016-01-25 (×2): qty 5
  Filled 2016-01-25: qty 2
  Filled 2016-01-25 (×2): qty 8
  Filled 2016-01-25: qty 11
  Filled 2016-01-25 (×2): qty 5
  Filled 2016-01-25 (×2): qty 11
  Filled 2016-01-25: qty 3
  Filled 2016-01-25 (×2): qty 8
  Filled 2016-01-25: qty 11
  Filled 2016-01-25: qty 5
  Filled 2016-01-25 (×2): qty 11
  Filled 2016-01-25: qty 5
  Filled 2016-01-25: qty 3
  Filled 2016-01-25: qty 11
  Filled 2016-01-25: qty 5
  Filled 2016-01-25: qty 11
  Filled 2016-01-25 (×2): qty 8
  Filled 2016-01-25: qty 3
  Filled 2016-01-25: qty 11
  Filled 2016-01-25: qty 3
  Filled 2016-01-25: qty 5
  Filled 2016-01-25 (×2): qty 8
  Filled 2016-01-25: qty 2

## 2016-01-25 MED ORDER — INSULIN ASPART 100 UNIT/ML ~~LOC~~ SOLN
0.0000 [IU] | Freq: Every day | SUBCUTANEOUS | Status: DC
  Administered 2016-01-25: 3 [IU] via SUBCUTANEOUS
  Administered 2016-01-26: 2 [IU] via SUBCUTANEOUS
  Administered 2016-01-27 – 2016-01-29 (×3): 3 [IU] via SUBCUTANEOUS
  Administered 2016-01-30: 4 [IU] via SUBCUTANEOUS
  Administered 2016-01-31: 2 [IU] via SUBCUTANEOUS
  Administered 2016-02-01: 3 [IU] via SUBCUTANEOUS
  Administered 2016-02-02: 2 [IU] via SUBCUTANEOUS
  Administered 2016-02-03: 4 [IU] via SUBCUTANEOUS
  Administered 2016-02-04: 3 [IU] via SUBCUTANEOUS
  Administered 2016-02-05: 2 [IU] via SUBCUTANEOUS
  Administered 2016-02-06: 3 [IU] via SUBCUTANEOUS
  Administered 2016-02-09: 2 [IU] via SUBCUTANEOUS
  Administered 2016-02-10 – 2016-02-11 (×2): 3 [IU] via SUBCUTANEOUS
  Administered 2016-02-12: 5 [IU] via SUBCUTANEOUS
  Administered 2016-02-13: 3 [IU] via SUBCUTANEOUS
  Filled 2016-01-25: qty 2
  Filled 2016-01-25 (×3): qty 3
  Filled 2016-01-25 (×2): qty 2
  Filled 2016-01-25: qty 3
  Filled 2016-01-25: qty 2
  Filled 2016-01-25 (×2): qty 3
  Filled 2016-01-25: qty 2
  Filled 2016-01-25: qty 5
  Filled 2016-01-25: qty 4
  Filled 2016-01-25: qty 5
  Filled 2016-01-25 (×3): qty 3

## 2016-01-25 MED ORDER — VANCOMYCIN HCL IN DEXTROSE 1-5 GM/200ML-% IV SOLN
1000.0000 mg | Freq: Once | INTRAVENOUS | Status: AC
  Administered 2016-01-25: 1000 mg via INTRAVENOUS
  Filled 2016-01-25: qty 200

## 2016-01-25 MED ORDER — TAMSULOSIN HCL 0.4 MG PO CAPS
0.4000 mg | ORAL_CAPSULE | Freq: Every day | ORAL | Status: DC
  Administered 2016-01-25 – 2016-02-14 (×19): 0.4 mg via ORAL
  Filled 2016-01-25 (×21): qty 1

## 2016-01-25 MED ORDER — CLOPIDOGREL BISULFATE 75 MG PO TABS
75.0000 mg | ORAL_TABLET | Freq: Every day | ORAL | Status: DC
  Administered 2016-01-25 – 2016-02-14 (×18): 75 mg via ORAL
  Filled 2016-01-25 (×19): qty 1

## 2016-01-25 MED ORDER — VANCOMYCIN HCL 10 G IV SOLR
1500.0000 mg | INTRAVENOUS | Status: DC
  Administered 2016-01-26 – 2016-01-28 (×3): 1500 mg via INTRAVENOUS
  Filled 2016-01-25 (×4): qty 1500

## 2016-01-25 NOTE — Consult Note (Signed)
Reason for Consult: Multiple ulcerations right foot with exposed bone on the great toe Referring Physician: Hospitalist  Jason [name] is an 82 y.o. male.  HPI: Patient relates a history of ulcerations on his right foot for the last few weeks. States that 2 weeks ago he did have revascularization of the right lower extremity for limb threatening ischemia. He has been in a local nursing home where his wound care has been managed. Was seen outpatient in the office today with exposed bone to the right great toe with multiple ulcerations on the right foot and decision was made for admission and definitive amputations and or debridements  Past Medical History:  Diagnosis Date  . BPH (benign prostatic hyperplasia)   . Constipation   . COPD (chronic obstructive pulmonary disease) ([place])   . Diabetes mellitus without complication ([place])   . GERD (gastroesophageal reflux disease)   . HTN (hypertension)   . Hypothyroidism   . Stroke [facility])     Past Surgical History:  Procedure Laterality Date  . PERIPHERAL VASCULAR CATHETERIZATION Right 01/10/2016   Procedure: Lower Extremity Angiography;  Surgeon: Katha Cabal, MD;  Location: [place] [facility];  Service: Cardiovascular;  Laterality: Right;  . PERIPHERAL VASCULAR CATHETERIZATION N/A 01/10/2016   Procedure: Abdominal Aortogram w/Lower Extremity;  Surgeon: Katha Cabal, MD;  Location: [place] [facility];  Service: Cardiovascular;  Laterality: N/A;  . PERIPHERAL VASCULAR CATHETERIZATION  01/10/2016   Procedure: Lower Extremity Intervention;  Surgeon: Katha Cabal, MD;  Location: [place] [facility];  Service: Cardiovascular;;  . ROTATOR CUFF REPAIR Left   . THYROID SURGERY      Family History  Problem Relation Age of Onset  . Diabetes Mother   . Thyroid disease Mother   . Pancreatic cancer Father     Social History:  reports that he has quit smoking. He has never used smokeless tobacco. He reports that he does not drink  alcohol or use drugs.  Allergies: No Known Allergies  Medications:  Scheduled: . aspirin EC  81 mg Oral Daily  . clopidogrel  75 mg Oral Daily  . enalapril  20 mg Oral Daily  . [START ON 01/26/2016] FLUoxetine  40 mg Oral Daily  . [START ON 01/26/2016] furosemide  80 mg Oral BH-q7a  . insulin aspart  0-15 Units Subcutaneous TID WC  . insulin aspart  0-5 Units Subcutaneous QHS  . Insulin Glargine  90 Units Subcutaneous BID  . [START ON 01/26/2016] levothyroxine  125 mcg Oral Q0600  . mometasone-formoterol  2 puff Inhalation BID  . pantoprazole  40 mg Oral Daily  . piperacillin-tazobactam (ZOSYN)  IV  3.375 g Intravenous Once  . [START ON 01/26/2016] piperacillin-tazobactam (ZOSYN)  IV  3.375 g Intravenous Q8H  . simvastatin  20 mg Oral QHS  . tamsulosin  0.4 mg Oral Daily  . [START ON 01/26/2016] vancomycin  1,500 mg Intravenous Q24H    Results for orders placed or performed during the hospital encounter of 01/25/16 (from the past 48 hour(s))  Glucose, capillary     Status: Abnormal   Collection Time: 01/25/16  5:37 PM  Result Value Ref Range   Glucose-Capillary 328 (H) 65 - 99 mg/dL  Creatinine, serum     Status: Abnormal   Collection Time: 01/25/16  7:14 PM  Result Value Ref Range   Creatinine, Ser 1.23 0.61 - 1.24 mg/dL   GFR calc non Af Amer 53 (L) >60 mL/min   GFR calc Af Amer >60 >  60 mL/min    Comment: (NOTE) The eGFR has been calculated using the CKD EPI equation. This calculation has not been validated in all clinical situations. eGFR's persistently <60 mL/min signify possible Chronic Kidney Disease.     No results found.  Review of Systems  Constitutional: Negative for chills and fever.  HENT: Negative.   Eyes: Negative.   Respiratory: Negative.   Cardiovascular: Negative.   Gastrointestinal: Negative for nausea and vomiting.  Genitourinary: Negative.   Skin:       Patient has had an open sore on his right great toe for the past several weeks. Also has an  ulcer on his right heel  Neurological:       Patient relates significant numbness in both lower extremities related to his diabetes  Endo/Heme/Allergies: Negative.   Psychiatric/Behavioral: Negative.    Blood pressure (!) 126/51, pulse 92, temperature 98.1 F (36.7 C), temperature source Oral, resp. rate 18, height _0  (1.727 m), weight 117.8 kg (259 lb 12.8 oz), SpO2 99 %. Physical Exam  Cardiovascular:  DP and PT pulses are palpable but diminished bilateral.  Musculoskeletal:  Adequate range of motion of the pedal joints. Exposed bone through the dorsal aspect of the right great toe. Muscle testing is deferred.  Neurological:  Complete loss of protective threshold monofilament wire in the forefoot and digits bilateral. Proprioception is impaired  Skin:  Skin is warm dry and atrophic bilateral HAIR growth. Some bilateral hyperpigmentation's. Some edema is present in the right foot. Full-thickness ulceration over the dorsum of the right hallux with exposed bone of the proximal phalanx protruding through the wound. Full-thickness ulceration with necrosis of underlying subcutaneous and deeper muscular tissue beneath the fifth metatarsal head on the right foot. Large intact eschar with full-thickness ulceration which is dry on the plantar medial aspect of the right heel.    Assessment/Plan: Assessment: 1. Full-thickness ulceration with osteomyelitis and exposed bone right great toe. 2. Full-thickness ulceration with necrosis of muscle right fifth metatarsal. 3. Full-thickness ulceration right heel. 4. Diabetes with associated neuropathy. 5. Peripheral vascular disease  Plan: Discussed with the patient the need for amputation of the right great toe as well as a partial ray resection. Also for debridement of the ulceration beneath the right fifth metatarsal and heel. Extent of debridement will depend on the MRI findings which is scheduled for tomorrow. We will discuss surgery in more detail  tomorrow pending the results of his MRI. At this point plan for nothing by mouth after midnight tomorrow night with surgery for Friday morning  [name] 01/25/2016, 8:58 PM

## 2016-01-25 NOTE — H&P (Signed)
[place] [facility] [place] NAME: Jason [name]    MR#:  [id number]  DATE OF BIRTH:  06-01-1932   DATE OF ADMISSION:  01/25/2016  PRIMARY CARE PHYSICIAN: Valera Castle, MD   REQUESTING/REFERRING PHYSICIAN: Cleda Mccreedy  CHIEF COMPLAINT:  Foot ulcer  HISTORY OF PRESENT ILLNESS:  Jason [name]  is a 82 y.o. male with a known history of type 2 diabetes, insulin requiring who is presenting from podiatry clinic for further management of right foot ulcer. His diabetes is complicated by neuropathy, he has already undergone local wound debridement and antibiotic therapy , and angioplasty of right leg (10/24).  On examination in podiatry clinic - not of exposed bone, thus sent to hospital for further workup and management Patient without complaints  PAST MEDICAL HISTORY:   Past Medical History:  Diagnosis Date  . BPH (benign prostatic hyperplasia)   . Constipation   . COPD (chronic obstructive pulmonary disease) ([place])   . Diabetes mellitus without complication ([place])   . GERD (gastroesophageal reflux disease)   . HTN (hypertension)   . Hypothyroidism   . Stroke [facility])     PAST SURGICAL HISTORY:   Past Surgical History:  Procedure Laterality Date  . PERIPHERAL VASCULAR CATHETERIZATION Right 01/10/2016   Procedure: Lower Extremity Angiography;  Surgeon: Katha Cabal, MD;  Location: [place] [facility];  Service: Cardiovascular;  Laterality: Right;  . PERIPHERAL VASCULAR CATHETERIZATION N/A 01/10/2016   Procedure: Abdominal Aortogram w/Lower Extremity;  Surgeon: Katha Cabal, MD;  Location: [place] [facility];  Service: Cardiovascular;  Laterality: N/A;  . PERIPHERAL VASCULAR CATHETERIZATION  01/10/2016   Procedure: Lower Extremity Intervention;  Surgeon: Katha Cabal, MD;  Location: [place] [facility];  Service: Cardiovascular;;  . ROTATOR CUFF REPAIR Left   . THYROID SURGERY      SOCIAL HISTORY:   Social History    Substance Use Topics  . Smoking status: Former Research scientist (life sciences)  . Smokeless tobacco: Never Used  . Alcohol use No    FAMILY HISTORY:   Family History  Problem Relation Age of Onset  . Diabetes Mother   . Thyroid disease Mother   . Pancreatic cancer Father     DRUG ALLERGIES:  No Known Allergies  REVIEW OF SYSTEMS:  REVIEW OF SYSTEMS:  CONSTITUTIONAL: Denies fevers, chills, fatigue, weakness.  EYES: Denies blurred vision, double vision, or eye pain.  EARS, NOSE, THROAT: Denies tinnitus, ear pain, hearing loss.  RESPIRATORY: denies cough, shortness of breath, wheezing  CARDIOVASCULAR: Denies chest pain, palpitations, edema.  GASTROINTESTINAL: Denies nausea, vomiting, diarrhea, abdominal pain.  GENITOURINARY: Denies dysuria, hematuria.  ENDOCRINE: Denies nocturia or thyroid problems. HEMATOLOGIC AND LYMPHATIC: Denies easy bruising or bleeding.  SKIN: Denies rash or lesions.  MUSCULOSKELETAL: Denies pain in neck, back, shoulder, knees, hips, or further arthritic symptoms.  NEUROLOGIC: Denies paralysis, paresthesias.  PSYCHIATRIC: Denies anxiety or depressive symptoms. Otherwise full review of systems performed by me is negative.   MEDICATIONS AT HOME:   Prior to Admission medications   Medication Sig Start Date End Date Taking? Authorizing Provider  ACCU-CHEK COMPACT PLUS test strip Apply 1 strip topically every morning. 05/29/14   Historical Provider, MD  ADVAIR DISKUS 250-50 MCG/DOSE AEPB Inhale 1 puff into the lungs 2 (two) times daily. 06/09/14   Historical Provider, MD  albuterol (PROVENTIL HFA;VENTOLIN HFA) 108 (90 Base) MCG/ACT inhaler Inhale into the lungs. 11/04/15 11/03/16  Historical Provider, MD  amoxicillin-clavulanate (AUGMENTIN) 875-125 MG tablet Take 1 tablet  by mouth every 12 (twelve) hours. Patient not taking: Reported on 01/10/2016 10/15/15   Lytle Butte, MD  aspirin 81 MG tablet Take 81 mg by mouth daily.    Historical Provider, MD  blood glucose meter kit and  supplies KIT Dispense based on patient and insurance preference. Use up to four times daily as directed. (FOR ICD-9 250.00, 250.01). 10/15/15   Lytle Butte, MD  clopidogrel (PLAVIX) 75 MG tablet Take 1 tablet (75 mg total) by mouth daily. 01/11/16   Katha Cabal, MD  enalapril (VASOTEC) 20 MG tablet Take 20 mg by mouth daily. 07/15/14   Historical Provider, MD  FLUoxetine (PROZAC) 40 MG capsule Take 40 mg by mouth daily.    Historical Provider, MD  furosemide (LASIX) 40 MG tablet Take 80 mg by mouth every morning.    Historical Provider, MD  insulin lispro (HUMALOG) 100 UNIT/ML injection Inject 20 Units into the skin 3 (three) times daily with meals.    Historical Provider, MD  LANTUS SOLOSTAR 100 UNIT/ML Solostar Pen Inject 90-100 Units into the skin 2 (two) times daily. Patient reports he takes Lantus 90 units QAM and Lantus 100 units QHS 06/15/14   Historical Provider, MD  omeprazole (PRILOSEC) 20 MG capsule Take 20 mg by mouth 2 (two) times daily. 06/15/14   Historical Provider, MD  oxyCODONE (OXY IR/ROXICODONE) 5 MG immediate release tablet Take 1 tablet (5 mg total) by mouth every 4 (four) hours as needed for moderate pain. 10/15/15   Lytle Butte, MD  simvastatin (ZOCOR) 20 MG tablet Take 20 mg by mouth at bedtime. 07/08/14   Historical Provider, MD  SYNTHROID 125 MCG tablet Take 125 mcg by mouth daily. 07/24/14   Historical Provider, MD  tamsulosin (FLOMAX) 0.4 MG CAPS capsule Take 0.4 mg by mouth daily.    Historical Provider, MD      VITAL SIGNS:  There were no vitals taken for this visit.  PHYSICAL EXAMINATION:  VITAL SIGNS:There were no vitals filed for this visit. GENERAL:83 y.o.male currently in no acute distress.  HEAD: Normocephalic, atraumatic.  EYES: Pupils equal, round, reactive to light. Extraocular muscles intact. No scleral icterus.  MOUTH: Moist mucosal membrane. Dentition intact. No abscess noted.  EAR, NOSE, THROAT: Clear without exudates. No external lesions.  NECK:  Supple. No thyromegaly. No nodules. No JVD.  PULMONARY: Clear to ascultation, without wheeze rails or rhonci. No use of accessory muscles, Good respiratory effort. good air entry bilaterally CHEST: Nontender to palpation.  CARDIOVASCULAR: S1 and S2. Regular rate and rhythm. No murmurs, rubs, or gallops. No edema. Pedal pulses 2+ bilaterally.  GASTROINTESTINAL: Soft, nontender, nondistended. No masses. Positive bowel sounds. No hepatosplenomegaly.  MUSCULOSKELETAL: No swelling, clubbing, or edema. Range of motion full in all extremities.  NEUROLOGIC: Cranial nerves II through XII are intact. No gross focal neurological deficits. Sensation intact. Reflexes intact.  SKIN: left elbow skin tear, right foot dressing clean/dry/intact, No ulceration, lesions, rashes, or cyanosis. Skin warm and dry. Turgor intact.  PSYCHIATRIC: Mood, affect within normal limits. The patient is awake, alert and oriented x 3. Insight, judgment intact.    LABORATORY PANEL:   CBC No results for input(s): WBC, HGB, HCT, PLT in the last 168 hours. ------------------------------------------------------------------------------------------------------------------  Chemistries  No results for input(s): NA, K, CL, CO2, GLUCOSE, BUN, CREATININE, CALCIUM, MG, AST, ALT, ALKPHOS, BILITOT in the last 168 hours.  Invalid input(s): GFRCGP ------------------------------------------------------------------------------------------------------------------  Cardiac Enzymes No results for input(s): TROPONINI in the last 168 hours. ------------------------------------------------------------------------------------------------------------------  RADIOLOGY:  No results found.  EKG:   Orders placed or performed during the hospital encounter of 10/12/15  . ED EKG  . ED EKG    IMPRESSION AND PLAN:   82 year old male history of insulin requiring type 2 diabetes presenting with foot ulcer.  1. Right foot osteomyelitis: podiatry  consult, start antibiotics, MRI, esr 2. Type 2 diabetes insulin requiring: restart basal insulin with sliding coverage, diabetic consult  3. Hypothyroidism unspecified: synthroid 4. Essential hypertension: vasotec    All the records are reviewed and case discussed with ED provider. Management plans discussed with the patient, family and they are in agreement.  CODE STATUS: full  TOTAL TIME TAKING CARE OF THIS PATIENT: 33 minutes.    Naomy Esham,  Karenann Cai.D on 01/25/2016 at 5:55 PM  Between 7am to 6pm - Pager - [phone]  After 6pm: House Pager: - [phone]  [facility]  Office  [phone]  CC: Primary care physician; Valera Castle, MD

## 2016-01-25 NOTE — Progress Notes (Signed)
Pharmacy Antibiotic Note  Jason [name] is a 82 y.o. male admitted on 01/25/2016 with osteomyelitis.  Pharmacy has been consulted for vancomycin and piperacillin/tazobactam dosing.  Plan: Piperacillin/tazobactam 3.375 g IV q8h EI  Vancomycin 1000 mg dose given on admission followed by vancomycin 1500 mg IV q24h Goal vancomycin trough 15-20 mcg/mL Vancomycin trough ordered for 11/12 @ 0130  Kinetics: Using adjusted body weight = 88 kg Ke: 0.053 Half-life: 13 hrs Vd: 61 L Cmin estimated ~15 mcg/mL  Patient at risk for accumulation due to obesity  Height: 5\' 8"  (172.7 cm) Weight: 259 lb 12.8 oz (117.8 kg) IBW/kg (Calculated) : 68.4  Temp (24hrs), Avg:98.1 F (36.7 C), Min:98.1 F (36.7 C), Max:98.1 F (36.7 C)   Recent Labs Lab 01/25/16 1914  CREATININE 1.23    Estimated Creatinine Clearance: 58.8 mL/min (by C-G formula based on SCr of 1.23 mg/dL).    No Known Allergies  Antimicrobials this admission: vancomycin 11/8 >>  Piperacillin/tazobactam 11/8 >>   Dose adjustments this admission:  Thank you for allowing pharmacy to be a part of this patient's care.  Cindi CarbonMary M Braylyn Kalter, PharmD, BCPS Clinical Pharmacist 01/25/2016 8:10 PM

## 2016-01-26 ENCOUNTER — Inpatient Hospital Stay: Payer: Medicare Other

## 2016-01-26 LAB — GLUCOSE, CAPILLARY
GLUCOSE-CAPILLARY: 111 mg/dL — AB (ref 65–99)
GLUCOSE-CAPILLARY: 224 mg/dL — AB (ref 65–99)
Glucose-Capillary: 91 mg/dL (ref 65–99)
Glucose-Capillary: 210 mg/dL — ABNORMAL HIGH (ref 65–99)

## 2016-01-26 LAB — HEMOGLOBIN A1C
Hgb A1c MFr Bld: 6.7 % — ABNORMAL HIGH (ref 4.8–5.6)
MEAN PLASMA GLUCOSE: 146 mg/dL

## 2016-01-26 LAB — BASIC METABOLIC PANEL
ANION GAP: 7 (ref 5–15)
BUN: 15 mg/dL (ref 6–20)
CHLORIDE: 94 mmol/L — AB (ref 101–111)
CO2: 34 mmol/L — ABNORMAL HIGH (ref 22–32)
Calcium: 6.9 mg/dL — ABNORMAL LOW (ref 8.9–10.3)
Creatinine, Ser: 1.22 mg/dL (ref 0.61–1.24)
GFR calc Af Amer: >60 mL/min (ref >60)
GFR, EST NON AFRICAN AMERICAN: 54 mL/min — AB (ref >60)
GLUCOSE: 181 mg/dL — AB (ref 65–99)
POTASSIUM: 2.4 mmol/L — AB (ref 3.5–5.1)
Sodium: 135 mmol/L (ref 135–145)

## 2016-01-26 LAB — CBC
HEMATOCRIT: 29.2 % — AB (ref 40.0–52.0)
HEMOGLOBIN: 9.9 g/dL — AB (ref 13.0–18.0)
MCH: 28.3 pg (ref 26.0–34.0)
MCHC: 34 g/dL (ref 32.0–36.0)
MCV: 83.3 fL (ref 80.0–100.0)
Platelets: 135 10*3/uL — ABNORMAL LOW (ref 150–440)
RBC: 3.51 MIL/uL — ABNORMAL LOW (ref 4.40–5.90)
RDW: 13.7 % (ref 11.5–14.5)
WBC: 7.9 10*3/uL (ref 3.8–10.6)

## 2016-01-26 LAB — PHOSPHORUS: Phosphorus: 2.5 mg/dL (ref 2.5–4.6)

## 2016-01-26 LAB — SEDIMENTATION RATE: SED RATE: 70 mm/h — AB (ref 0–20)

## 2016-01-26 LAB — MAGNESIUM: MAGNESIUM: 1.2 mg/dL — AB (ref 1.7–2.4)

## 2016-01-26 LAB — POTASSIUM: Potassium: 3.1 mmol/L — ABNORMAL LOW (ref 3.5–5.1)

## 2016-01-26 MED ORDER — GLUCERNA SHAKE PO LIQD
237.0000 mL | Freq: Three times a day (TID) | ORAL | Status: DC
  Administered 2016-01-26 – 2016-02-13 (×35): 237 mL via ORAL

## 2016-01-26 MED ORDER — INSULIN GLARGINE 100 UNIT/ML ~~LOC~~ SOLN
20.0000 [IU] | Freq: Every day | SUBCUTANEOUS | Status: DC
  Administered 2016-01-26 – 2016-01-29 (×4): 20 [IU] via SUBCUTANEOUS
  Filled 2016-01-26 (×4): qty 0.2

## 2016-01-26 MED ORDER — SODIUM CHLORIDE 0.9 % IV SOLN
Freq: Once | INTRAVENOUS | Status: AC
  Administered 2016-01-26: 05:00:00 via INTRAVENOUS
  Filled 2016-01-26: qty 500

## 2016-01-26 MED ORDER — SENNOSIDES-DOCUSATE SODIUM 8.6-50 MG PO TABS
2.0000 | ORAL_TABLET | Freq: Two times a day (BID) | ORAL | Status: DC
  Administered 2016-01-26 – 2016-02-13 (×32): 2 via ORAL
  Filled 2016-01-26 (×34): qty 2

## 2016-01-26 MED ORDER — POTASSIUM CHLORIDE CRYS ER 20 MEQ PO TBCR
20.0000 meq | EXTENDED_RELEASE_TABLET | ORAL | Status: AC
  Administered 2016-01-26 (×2): 20 meq via ORAL
  Filled 2016-01-26 (×2): qty 1

## 2016-01-26 MED ORDER — POTASSIUM CHLORIDE CRYS ER 20 MEQ PO TBCR
40.0000 meq | EXTENDED_RELEASE_TABLET | Freq: Once | ORAL | Status: AC
  Administered 2016-01-26: 40 meq via ORAL
  Filled 2016-01-26: qty 2

## 2016-01-26 MED ORDER — SODIUM CHLORIDE 0.9 % IV SOLN: 30.0000 meq | Freq: Once | INTRAVENOUS | Status: DC

## 2016-01-26 MED ORDER — MAGNESIUM SULFATE 4 GM/100ML IV SOLN
4.0000 g | Freq: Once | INTRAVENOUS | Status: AC
  Administered 2016-01-26: 4 g via INTRAVENOUS
  Filled 2016-01-26: qty 100

## 2016-01-26 NOTE — Care Management (Signed)
From [facility]. CSW aware

## 2016-01-26 NOTE — Progress Notes (Signed)
Initial Nutrition Assessment  DOCUMENTATION CODES:   Severe malnutrition in context of acute illness/injury  INTERVENTION:  1. Glucerna Shake po TID, each supplement provides 220 kcal and 10 grams of protein  NUTRITION DIAGNOSIS:   Malnutrition related to acute illness as evidenced by energy intake < or equal to 50% for > or equal to 5 days, percent weight loss.  GOAL:   Patient will meet greater than or equal to 90% of their needs  MONITOR:   PO intake, Supplement acceptance, I & O's, Labs, Weight trends  REASON FOR ASSESSMENT:   Malnutrition Screening Tool    ASSESSMENT:   Jason [name]  is a [age] y.o. male with a known history of type 2 diabetes, insulin requiring who is presenting from podiatry clinic for further management of right foot ulcer.   Spoke with Jason [name] at bedside. He admits to little to no intake for 3 weeks PTA, with subsequent 25# wt loss. Patient's wife states he was on antibiotics and when he started them he had no appetite, would pick at food he was served at nursing home. He also dislikes chicken, states he was in the [employer], served a chicken leg that was raw, now only eats chicken breast occasionally. Will not eat any other chicken. Per chart, patient exhibits a 31#/11% severe wt loss over 2 weeks. Upon physical exam, he looks well nourished. Also has R foot osteomyelitis. Endorses a good appetite today, was very hungry during visit. Labs and medications reviewed: K 2.4   Diet Order:  Diet Carb Modified Fluid consistency: Thin; Room service appropriate? Yes  Skin:  Wound (see comment) (osteomyelitis of R foot)  Last BM:  01/25/2016  Height:   Ht Readings from Last 1 Encounters:  01/25/16 5\' 8"  (1.727 m)    Weight:   Wt Readings from Last 1 Encounters:  01/25/16 259 lb 12.8 oz (117.8 kg)    Ideal Body Weight:  70 kg  BMI:  Body mass index is 39.5 kg/m.  Estimated Nutritional Needs:   Kcal:  2000-2450 calories (25-30 cal/kg  ABW)  Protein:  117-140 gm  Fluid:  >/= 2L  EDUCATION NEEDS:   No education needs identified at this time  Dionne AnoWilliam M. Glenola Wheat, MS, RD LDN Inpatient Clinical Dietitian Pager [phone]

## 2016-01-26 NOTE — Clinical Social Work Note (Signed)
Clinical Social Work Assessment  Patient Details  Name: Jason [name] MRN: [id number] Date of Birth: [birth year]  Date of referral:  01/26/16               Reason for consult:  Discharge Planning, Facility Placement                Permission sought to share information with:  Oceanographeracility Contact Representative Permission granted to share information::  Yes, Verbal Permission Granted  Name::      Skilled Nursing Facility    Agency::   [place]   Relationship::     Contact Information:     Housing/Transportation Living arrangements for the past 2 months:  Skilled Nursing Facility (Patient is an existing patient at [facility]) Source of Information:  Adult Children Patient Interpreter Needed:  None Criminal Activity/Legal Involvement Pertinent to Current Situation/Hospitalization:  No - Comment as needed Significant Relationships:  Adult Children, Spouse Lives with:  Facility Resident ([facility] is an [facility] Do you feel safe going back to the place where you live?  Yes Need for family participation in patient care:  Yes (Comment)  Care giving concerns:  According to patients son, Jason RuizJohn, patient is an short-term rehab resident at [facility].    Social Worker assessment / plan:  Visual merchandiserClinical Social Worker (CSW) received verbal consult from RN in progression rounds. PT has not worked with patient yet. Patient was not in the room when social work intern went to do an assessment. Social work Tax inspectorintern spoke with patient's son, Jason RuizJohn over the phone. Per patient's son, patient has been living at [facility] since early September. Patient is there for short-term rehab. Son would like patient to go back to [facility] upon discharge. Per patient's son, patient lived [place] before coming [facility]. CSW spoke with [facility] admissions coordinator at [facility] making him aware of above. Raiford NobleRick confirmed that patient is a short-term rehab patient at [facility]. CSW will keep Raiford NobleRick  updated for when patient will return. Patient does not have a HPOA at this time.   Fl2 completed and faxed out.  Employment status:  Retired Health and safety inspectornsurance information:  Medicare PT Recommendations:  Not assessed at this time Information / Referral to community resources:  Skilled Nursing Facility  Patient/Family's Response to care:  Patient's son is wanting patient to go back to [facility] upon discharge.   Patient/Family's Understanding of and Emotional Response to Diagnosis, Current Treatment, and Prognosis:  Patient was not in the room when social work intern went to do an assessment. Social work Tax inspectorintern spoke to patient's son. Patient's son was pleasant and thanked social work IT consultantintern for calling.   Emotional Assessment Appearance:  Other (Comment Required (Patient was not in the room. Spoke with son over the phone) Attitude/Demeanor/Rapport:  Unable to Assess Affect (typically observed):  Unable to Assess Orientation:  Oriented to Self, Oriented to Place, Oriented to  Time, Oriented to Situation Alcohol / Substance use:  Not Applicable Psych involvement (Current and /or in the community):  No (Comment)  Discharge Needs  Concerns to be addressed:  Basic Needs, Discharge Planning Concerns Readmission within the last 30 days:  No Current discharge risk:  None Barriers to Discharge:  Continued Medical Work up   Coca-ColaMackenzie Ravin Bendall, Student-Social Work 01/26/2016, 11:07 AM

## 2016-01-26 NOTE — Progress Notes (Signed)
Subjective: Patient seen. Resting comfortably. Just woke up.  Objective: Vital signs in last 24 hours: Temp:  [98.1 F (36.7 C)-99 F (37.2 C)] 98.1 F (36.7 C) (11/09 1317) Pulse Rate:  [86-95] 88 (11/09 1317) Resp:  [18-19] 18 (11/09 0757) BP: (107-126)/(48-79) 115/48 (11/09 1317) SpO2:  [91 %-99 %] 96 % (11/09 1317) Weight:  [117.8 kg (259 lb 12.8 oz)] 117.8 kg (259 lb 12.8 oz) (11/08 1924) Last BM Date: 01/25/16  Intake/Output from previous day: 11/08 0701 - 11/09 0700 In: 2440 [P.O.:640; I.V.:500; IV Piggyback:1300] Out: -  Intake/Output this shift: Total I/O In: 120 [P.O.:120] Out: -   The bandage on the right foot is dry and intact.  Lab Results:   Recent Labs  01/26/16 0326  WBC 7.9  HGB 9.9*  HCT 29.2*  PLT 135*   BMET  Recent Labs  01/25/16 1914 01/26/16 0326  NA  --  135  K  --  2.4*  CL  --  94*  CO2  --  34*  GLUCOSE  --  181*  BUN  --  15  CREATININE 1.23 1.22  CALCIUM  --  6.9*   PT/INR No results for input(s): LABPROT, INR in the last 72 hours. ABG No results for input(s): PHART, HCO3 in the last 72 hours.  Invalid input(s): PCO2, PO2  Studies/Results: Mr Foot Right Wo Contrast  Result Date: 01/26/2016 CLINICAL DATA:  Right great toe ulceration. EXAM: MRI OF THE RIGHT FOREFOOT WITHOUT CONTRAST TECHNIQUE: Multiplanar, multisequence MR imaging was performed. No intravenous contrast was administered. COMPARISON:  None. FINDINGS: TENDONS Peroneal: Peroneal longus tendon intact. Peroneal brevis intact. Posteromedial: Posterior tibial tendon intact. Flexor hallucis longus tendon intact. Flexor digitorum longus tendon intact. Anterior: Tibialis anterior tendon intact. Extensor hallucis longus tendon intact Extensor digitorum longus tendon intact. Achilles:  Severe tendinosis of the distal Achilles tendon. Plantar Fascia: Intact. LIGAMENTS Lateral: Anterior talofibular ligament intact. Calcaneofibular ligament intact. Posterior talofibular  ligament intact. Anterior and posterior tibiofibular ligaments intact. Medial: Deltoid ligament intact. Spring ligament intact. CARTILAGE Ankle Joint: No joint effusion. Normal ankle mortise. No chondral defect. Subtalar Joints/Sinus Tarsi: Normal subtalar joints. No subtalar joint effusion. Normal sinus tarsi. Bones: Soft tissue ulceration over the first MTP joint. Cortical disruption of the dorsal cortex along the base of the first proximal phalanx. Marrow edema in the first distal phalanx and first metatarsal head without definite cortical destruction. Soft Tissue: No fluid collection or hematoma. IMPRESSION: 1. Soft tissue ulceration over the first MTP joint. Cortical disruption of the dorsal cortex along the base of the first proximal phalanx with marrow edema most concerning for osteomyelitis. Marrow edema in the first distal phalanx and first metatarsal head without definite cortical destruction may reflect reactive marrow edema versus early osteomyelitis. 2. Severe tendinosis of the distal Achilles tendon. Electronically Signed   By: Elige KoHetal  Patel   On: 01/26/2016 10:58    Anti-infectives: Anti-infectives    Start     Dose/Rate Route Frequency Ordered Stop   01/26/16 0600  piperacillin-tazobactam (ZOSYN) IVPB 3.375 g     3.375 g 12.5 mL/hr over 240 Minutes Intravenous Every 8 hours 01/25/16 2009     01/26/16 0200  vancomycin (VANCOCIN) 1,500 mg in sodium chloride 0.9 % 500 mL IVPB     1,500 mg 250 mL/hr over 120 Minutes Intravenous Every 24 hours 01/25/16 2008     01/25/16 2200  piperacillin-tazobactam (ZOSYN) IVPB 3.375 g  Status:  Discontinued     3.375 g 12.5 mL/hr over  240 Minutes Intravenous Every 8 hours 01/25/16 1751 01/25/16 1753   01/25/16 1830  vancomycin (VANCOCIN) IVPB 1000 mg/200 mL premix     1,000 mg 200 mL/hr over 60 Minutes Intravenous  Once 01/25/16 1752 01/25/16 2105   01/25/16 1830  piperacillin-tazobactam (ZOSYN) IVPB 3.375 g     3.375 g 12.5 mL/hr over 240 Minutes  Intravenous  Once 01/25/16 1753 01/26/16 0005   01/25/16 1800  vancomycin (VANCOCIN) IVPB 1000 mg/200 mL premix  Status:  Discontinued     1,000 mg 200 mL/hr over 60 Minutes Intravenous Every 24 hours 01/25/16 1751 01/25/16 1753      Assessment/Plan: s/p Procedure(s): AMPUTATION TOE (Right) IRRIGATION AND DEBRIDEMENT FOOT (Right) Assessment: Osteomyelitis right great toe and first metatarsal with ulcerations right fifth metatarsal and heel  Plan: Discussed with the patient procedures that will need to be performed tomorrow. Discussed that the MRI did show osteomyelitis and bone infection in his right great toe and joint but not in his fifth metatarsal area or heel. Discussed again amputation with first ray resection of the hallux and first metatarsal. At this point plan for debridement of the fifth metatarsal and heel area. Discussed that he may still need debridement of some of the bone at the fifth metatarsal depending what is found intraoperatively. Discussed possible risks and complications including inability to heal and need for further surgery or higher up amputation. Patient is aware of this. Consent form will be obtained for the above-mentioned procedures. Nothing by mouth after midnight. Plan for surgery tomorrow morning  LOS: 1 day    Ricci Barkerodd W Teddi Badalamenti 01/26/2016

## 2016-01-26 NOTE — Progress Notes (Signed)
[facility] [place] [facility]   PATIENT NAME: Jason [name]    MR#:  [id number]  DATE OF BIRTH:  [birth year]  SUBJECTIVE:   Came in with ongoing infection of the right foot REVIEW OF SYSTEMS:   Review of Systems  Constitutional: Negative for chills, fever and weight loss.  HENT: Negative for ear discharge, ear pain and nosebleeds.   Eyes: Negative for blurred vision, pain and discharge.  Respiratory: Negative for sputum production, shortness of breath, wheezing and stridor.   Cardiovascular: Negative for chest pain, palpitations, orthopnea and PND.  Gastrointestinal: Negative for abdominal pain, diarrhea, nausea and vomiting.  Genitourinary: Negative for frequency and urgency.  Musculoskeletal: Positive for joint pain. Negative for back pain.  Neurological: Positive for weakness. Negative for sensory change, speech change and focal weakness.  Psychiatric/Behavioral: Negative for depression and hallucinations. The patient is not nervous/anxious.    Tolerating Diet: Tolerating PT:   DRUG ALLERGIES:  No Known Allergies  VITALS:  Blood pressure (!) 115/48, pulse 88, temperature 98.1 F (36.7 C), temperature source Oral, resp. rate 18, height 5\' 8"  (1.727 m), weight 117.8 kg (259 lb 12.8 oz), SpO2 96 %.  PHYSICAL EXAMINATION:   Physical Exam  GENERAL:  82 y.o.-year-old patient lying in the bed with no acute distress.  EYES: Pupils equal, round, reactive to light and accommodation. No scleral icterus. Extraocular muscles intact.  HEENT: Head atraumatic, normocephalic. Oropharynx and nasopharynx clear.  NECK:  Supple, no jugular venous distention. No thyroid enlargement, no tenderness.  LUNGS: Normal breath sounds bilaterally, no wheezing, rales, rhonchi. No use of accessory muscles of respiration.  CARDIOVASCULAR: S1, S2 normal. No murmurs, rubs, or gallops.  ABDOMEN: Soft, nontender, nondistended. Bowel sounds present. No organomegaly or mass.   EXTREMITIES: No cyanosis, clubbing or edema b/l.   Right chronic nonhealling ulcers/infection of the great toe and 5th digit NEUROLOGIC: Cranial nerves II through XII are intact. No focal Motor or sensory deficits b/l.   PSYCHIATRIC:  patient is alert and oriented x 3.  SKIN: No obvious rash, lesion, or ulcer.   LABORATORY PANEL:  CBC  Recent Labs Lab 01/26/16 0326  WBC 7.9  HGB 9.9*  HCT 29.2*  PLT 135*    Chemistries   Recent Labs Lab 01/26/16 0326  NA 135  K 2.4*  CL 94*  CO2 34*  GLUCOSE 181*  BUN 15  CREATININE 1.22  CALCIUM 6.9*   Cardiac Enzymes No results for input(s): TROPONINI in the last 168 hours. RADIOLOGY:  Mr Foot Right Wo Contrast  Result Date: 01/26/2016 CLINICAL DATA:  Right great toe ulceration. EXAM: MRI OF THE RIGHT FOREFOOT WITHOUT CONTRAST TECHNIQUE: Multiplanar, multisequence MR imaging was performed. No intravenous contrast was administered. COMPARISON:  None. FINDINGS: TENDONS Peroneal: Peroneal longus tendon intact. Peroneal brevis intact. Posteromedial: Posterior tibial tendon intact. Flexor hallucis longus tendon intact. Flexor digitorum longus tendon intact. Anterior: Tibialis anterior tendon intact. Extensor hallucis longus tendon intact Extensor digitorum longus tendon intact. Achilles:  Severe tendinosis of the distal Achilles tendon. Plantar Fascia: Intact. LIGAMENTS Lateral: Anterior talofibular ligament intact. Calcaneofibular ligament intact. Posterior talofibular ligament intact. Anterior and posterior tibiofibular ligaments intact. Medial: Deltoid ligament intact. Spring ligament intact. CARTILAGE Ankle Joint: No joint effusion. Normal ankle mortise. No chondral defect. Subtalar Joints/Sinus Tarsi: Normal subtalar joints. No subtalar joint effusion. Normal sinus tarsi. Bones: Soft tissue ulceration over the first MTP joint. Cortical disruption of the dorsal cortex along the base of the first proximal phalanx. Marrow edema in  the first  distal phalanx and first metatarsal head without definite cortical destruction. Soft Tissue: No fluid collection or hematoma. IMPRESSION: 1. Soft tissue ulceration over the first MTP joint. Cortical disruption of the dorsal cortex along the base of the first proximal phalanx with marrow edema most concerning for osteomyelitis. Marrow edema in the first distal phalanx and first metatarsal head without definite cortical destruction may reflect reactive marrow edema versus early osteomyelitis. 2. Severe tendinosis of the distal Achilles tendon. Electronically Signed   By: Elige KoHetal  Alquan Morrish   On: 01/26/2016 10:58   ASSESSMENT AND PLAN:  82 year old male history of insulin requiring type 2 diabetes presenting with foot ulcer.  1. Right foot osteomyelitis: podiatry consult with Dr Alberteen Spindleline noted -plans for surgery tomorrow am - start antibiotics -MRI right foot results d/w wife and son in the room  2. Type 2 diabetes insulin requiring: restart basal insulin with sliding coverage, diabetic consult   3. Hypothyroidism unspecified: synthroid  4. Essential hypertension: vasotec  5. DVT prophylaxis Lovneox  Case discussed with Care Management/Social Worker. Management plans discussed with the patient, family and they are in agreement.  CODE STATUS: FUll DVT Prophylaxis: loveonox TOTAL TIME TAKING CARE OF THIS PATIENT: 30 minutes.  >50% time spent on counselling and coordination of care pt, wife ans son  POSSIBLE D/C IN 1-2 DAYS, DEPENDING ON CLINICAL CONDITION.  Note: This dictation was prepared with Dragon dictation along with smaller phrase technology. Any transcriptional errors that result from this process are unintentional.  Enoc Getter M.D on 01/26/2016 at 6:32 PM  Between 7am to 6pm - Pager - [phone]  After 6pm go to [email or web address] - password [email or web address] [facility]  [facility]  Office  [phone]  CC: Primary care physician; Dione Housekeeperlmedo, Mario Ernesto, MD

## 2016-01-26 NOTE — Progress Notes (Signed)
This Clinical research associatewriter called Dr. Elisabeth PigeonVachhani, who responded. Notified him that this pt will be npo after midnight, and was due to received 90 units lantus this evening, realted that Fs today were 111, and 91 thus far at 1700. Received order to change lantus to 20 units instead of 90 units, leave sliding scale coverage in place. Pt was informed of this, and this Clinical research associatewriter secured consent for OR after Dr. Alberteen Spindleline was in and explained plan of care to pt based on MRI results. Pt cooperative, stated he was depressed regarding possible toe amputation.

## 2016-01-26 NOTE — Progress Notes (Signed)
Shift assessment completed. Pt is awake, alert and oriented at 0740, in no distress. Pt is on room air, lungs clear bilat with shallow respirations, hr is regular. Abdomen is soft, bsh eard. Pt has light bruising to r side of abdomen toward flank. Pt is incontinent of urine,lppp, R foot has dressing dry and intact, scd's on bilat. PIV #22 and #20 intact to R arm, potassium infusing at wrist area and zosyn infusing to ac, both sites are free of redness and swelling. Pt stated that he is unable to feel anything to his feet bilat, denied pain. Since assessment, pt has completed and mri of his foot and returned to the unit. Fs have not required coverage. Call bell in reach.

## 2016-01-26 NOTE — Progress Notes (Signed)
Text paged Dr Allena KatzPatel, pt wife requesting explanation of MRI result. Awaiting response.

## 2016-01-26 NOTE — Consult Note (Signed)
MEDICATION RELATED CONSULT NOTE - INITIAL   Pharmacy Consult for electrolytes Indication: hypokalemia  No Known Allergies  Patient Measurements: Height: 5\' 8"  (172.7 cm) Weight: 259 lb 12.8 oz (117.8 kg) IBW/kg (Calculated) : 68.4 Adjusted Body Weight:   Vital Signs: Temp: 98.1 F (36.7 C) (11/09 1317) Temp Source: Oral (11/09 1317) BP: 115/48 (11/09 1317) Pulse Rate: 88 (11/09 1317) Intake/Output from previous day: 11/08 0701 - 11/09 0700 In: 2440 [P.O.:640; I.V.:500; IV Piggyback:1300] Out: -  Intake/Output from this shift: No intake/output data recorded.  Labs:  Recent Labs  01/25/16 1914 01/26/16 0326  WBC  --  7.9  HGB  --  9.9*  HCT  --  29.2*  PLT  --  135*  CREATININE 1.23 1.22   Estimated Creatinine Clearance: 59.2 mL/min (by C-G formula based on SCr of 1.22 mg/dL).   Microbiology: Recent Results (from the past 720 hour(s))  MRSA PCR Screening     Status: None   Collection Time: 01/25/16  8:01 PM  Result Value Ref Range Status   MRSA by PCR NEGATIVE NEGATIVE Final    Comment:        The GeneXpert MRSA Assay (FDA approved for NASAL specimens only), is one component of a comprehensive MRSA colonization surveillance program. It is not intended to diagnose MRSA infection nor to guide or monitor treatment for MRSA infections.     Medical History: Past Medical History:  Diagnosis Date  . BPH (benign prostatic hyperplasia)   . Constipation   . COPD (chronic obstructive pulmonary disease) (HCC)   . Diabetes mellitus without complication (HCC)   . GERD (gastroesophageal reflux disease)   . HTN (hypertension)   . Hypothyroidism   . Stroke [facility](HCC)     Medications:  Scheduled:  . aspirin EC  81 mg Oral Daily  . clopidogrel  75 mg Oral Daily  . enalapril  20 mg Oral Daily  . feeding supplement (GLUCERNA SHAKE)  237 mL Oral TID BM  . FLUoxetine  40 mg Oral Daily  . furosemide  80 mg Oral BH-[id number]  . insulin aspart  0-15 Units Subcutaneous TID WC   . insulin aspart  0-5 Units Subcutaneous QHS  . insulin glargine  90 Units Subcutaneous Q supper  . levothyroxine  125 mcg Oral Q0600  . mometasone-formoterol  2 puff Inhalation BID  . pantoprazole  40 mg Oral Daily  . piperacillin-tazobactam (ZOSYN)  IV  3.375 g Intravenous Q8H  . senna-docusate  2 tablet Oral BID  . simvastatin  20 mg Oral QHS  . tamsulosin  0.4 mg Oral Daily  . vancomycin  1,500 mg Intravenous Q24H    Assessment: Pt is a 82 year old male with osteomylitis of the foot here for amuptation of toe. Found to have hypokalemia k=2.4. Patient was given 40 MEQ po and 40 MEQ IV so far.  Goal of Therapy:  K=3.5-5  Plan:  Will check K level at 1800. Will also check Mg and Phos level. Replace as needed.  Aariv Medlock D Thedora Rings 01/26/2016,2:50 PM

## 2016-01-26 NOTE — [place] FL2 (Signed)
[place] MEDICAID FL2 LEVEL OF CARE SCREENING TOOL     IDENTIFICATION  Patient Name: Jason [name] Birthdate: June 26, 1932 Sex: male Admission Date (Current Location): 01/25/2016  [place]   Facility and Address:  [facility], [address], [place], [place] [address]      Provider Number: [id number]  Attending Physician Name and Address:  Enedina FinnerSona Patel, MD  Relative Name and Phone Number:       Current Level of Care: Hospital Recommended Level of Care: Skilled Nursing Facility Prior Approval Number:    Date Approved/Denied:   PASRR Number:  [id number])  Discharge Plan: SNF    Current Diagnoses: Patient Active Problem List   Diagnosis Date Noted  . Osteomyelitis of foot, right, acute (HCC) 01/25/2016  . Hyperlipidemia 01/02/2016  . PAD (peripheral artery disease) (HCC) 01/02/2016  . Skin ulcer of right foot with necrosis of muscle (HCC) 01/02/2016  . COPD (chronic obstructive pulmonary disease) (HCC) 10/13/2015  . GERD (gastroesophageal reflux disease) 10/13/2015  . Hypothyroidism 10/13/2015  . BPH (benign prostatic hyperplasia) 10/13/2015  . Diabetes (HCC) 10/13/2015  . Open toe fracture 10/13/2015  . Pressure ulcer 10/13/2015    Orientation RESPIRATION BLADDER Height & Weight     Self, Time, Situation, Place  Normal Incontinent Weight: 259 lb 12.8 oz (117.8 kg) Height:  5\' 8"  (172.7 cm)  BEHAVIORAL SYMPTOMS/MOOD NEUROLOGICAL BOWEL NUTRITION STATUS   (None.)  (None.) Continent Diet (Diet: Carb Modified)  AMBULATORY STATUS COMMUNICATION OF NEEDS Skin   Extensive Assist Verbally PU Stage and Appropriate Care (Right Diabetic Ulcer )                       Personal Care Assistance Level of Assistance  Bathing, Feeding, Dressing Bathing Assistance: Limited assistance Feeding assistance: Independent Dressing Assistance: Limited assistance     Functional Limitations Info  Sight, Hearing, Speech Sight  Info: Adequate Hearing Info: Adequate Speech Info: Impaired (Upper Dentures)    SPECIAL CARE FACTORS FREQUENCY  PT (By licensed PT), OT (By licensed OT)     PT Frequency:  (5) OT Frequency:  (5)            Contractures      Additional Factors Info  Code Status, Allergies, Insulin Sliding Scale Code Status Info:  (Full Code) Allergies Info:  (No Known Allergies)   Insulin Sliding Scale Info:  (NovoLog, Lantus)       Current Medications (01/26/2016):  This is the current hospital active medication list Current Facility-Administered Medications  Medication Dose Route Frequency Provider Last Rate Last Dose  . acetaminophen (TYLENOL) tablet 650 mg  650 mg Oral Q6H PRN Wyatt Hasteavid K Hower, MD       Or  . acetaminophen (TYLENOL) suppository 650 mg  650 mg Rectal Q6H PRN Wyatt Hasteavid K Hower, MD      . aspirin EC tablet 81 mg  81 mg Oral Daily Wyatt Hasteavid K Hower, MD   81 mg at 01/25/16 2014  . clopidogrel (PLAVIX) tablet 75 mg  75 mg Oral Daily Wyatt Hasteavid K Hower, MD   75 mg at 01/25/16 2014  . enalapril (VASOTEC) tablet 20 mg  20 mg Oral Daily Wyatt Hasteavid K Hower, MD   20 mg at 01/25/16 2015  . FLUoxetine (PROZAC) capsule 40 mg  40 mg Oral Daily Wyatt Hasteavid K Hower, MD      . furosemide (LASIX) tablet 80 mg  80 mg Oral BH-q7a Wyatt Hasteavid K Hower, MD  80 mg at 01/26/16 0638  . insulin aspart (novoLOG) injection 0-15 Units  0-15 Units Subcutaneous TID WC Wyatt Hasteavid K Hower, MD   11 Units at 01/25/16 1822  . insulin aspart (novoLOG) injection 0-5 Units  0-5 Units Subcutaneous QHS Wyatt Hasteavid K Hower, MD   3 Units at 01/25/16 2207  . insulin glargine (LANTUS) injection 90 Units  90 Units Subcutaneous Q supper Wyatt Hasteavid K Hower, MD   90 Units at 01/25/16 2207  . levothyroxine (SYNTHROID, LEVOTHROID) tablet 125 mcg  125 mcg Oral Q0600 Wyatt Hasteavid K Hower, MD   125 mcg at 01/26/16 0507  . mometasone-formoterol (DULERA) 200-5 MCG/ACT inhaler 2 puff  2 puff Inhalation BID Wyatt Hasteavid K Hower, MD   2 puff at 01/26/16 0831  . oxyCODONE (Oxy IR/ROXICODONE)  immediate release tablet 5 mg  5 mg Oral Q4H PRN Wyatt Hasteavid K Hower, MD      . pantoprazole (PROTONIX) EC tablet 40 mg  40 mg Oral Daily Wyatt Hasteavid K Hower, MD   40 mg at 01/25/16 2014  . piperacillin-tazobactam (ZOSYN) IVPB 3.375 g  3.375 g Intravenous [address]Q8H Mary M LouisvilleSwayne, RPH   3.375 g at 01/26/16 0514  . simvastatin (ZOCOR) tablet 20 mg  20 mg Oral QHS Wyatt Hasteavid K Hower, MD   20 mg at 01/25/16 2012  . tamsulosin (FLOMAX) capsule 0.4 mg  0.4 mg Oral Daily Wyatt Hasteavid K Hower, MD   0.4 mg at 01/25/16 2014  . vancomycin (VANCOCIN) 1,500 mg in sodium chloride 0.9 % 500 mL IVPB  1,500 mg Intravenous Q24H Cindi CarbonMary M Swayne, RPH   1,500 mg at 01/26/16 0205     Discharge Medications: Please see discharge summary for a list of discharge medications.  Relevant Imaging Results:  Relevant Lab Results:   Additional Information  (SSN: [id number])  Ralene BatheMackenzie Deatrice Spanbauer, Student-Social Work

## 2016-01-26 NOTE — Plan of Care (Signed)
Problem: Bowel/Gastric: Goal: Will not experience complications related to bowel motility Outcome: Progressing Pt has had testing completed and continues to receive antibiotics. Pt has been cooperative, and has remained free of falls/injury this shift.

## 2016-01-26 NOTE — Consult Note (Addendum)
MEDICATION RELATED CONSULT NOTE - INITIAL   Pharmacy Consult for electrolytes Indication: hypokalemia  No Known Allergies  Patient Measurements: Height: 5\' 8"  (172.7 cm) Weight: 259 lb 12.8 oz (117.8 kg) IBW/kg (Calculated) : 68.4  Vital Signs: Temp: 98.8 F (37.1 C) (11/09 1920) Temp Source: Oral (11/09 1920) BP: 131/50 (11/09 1920) Pulse Rate: 91 (11/09 1920) Intake/Output from previous day: 11/08 0701 - 11/09 0700 In: 2440 [P.O.:640; I.V.:500; IV Piggyback:1300] Out: -  Intake/Output from this shift: No intake/output data recorded.  Labs:  Recent Labs  01/25/16 1914 01/26/16 0326 01/26/16 1811  WBC  --  7.9  --   HGB  --  9.9*  --   HCT  --  29.2*  --   PLT  --  135*  --   CREATININE 1.23 1.22  --   MG  --   --  1.2*  PHOS  --   --  2.5   Estimated Creatinine Clearance: 59.2 mL/min (by C-G formula based on SCr of 1.22 mg/dL).  Microbiology: Recent Results (from the past 720 hour(s))  MRSA PCR Screening     Status: None   Collection Time: 01/25/16  8:01 PM  Result Value Ref Range Status   MRSA by PCR NEGATIVE NEGATIVE Final    Comment:        The GeneXpert MRSA Assay (FDA approved for NASAL specimens only), is one component of a comprehensive MRSA colonization surveillance program. It is not intended to diagnose MRSA infection nor to guide or monitor treatment for MRSA infections.     Medical History: Past Medical History:  Diagnosis Date  . BPH (benign prostatic hyperplasia)   . Constipation   . COPD (chronic obstructive pulmonary disease) (HCC)   . Diabetes mellitus without complication (HCC)   . GERD (gastroesophageal reflux disease)   . HTN (hypertension)   . Hypothyroidism   . Stroke [facility](HCC)     Medications:  Scheduled:  . aspirin EC  81 mg Oral Daily  . clopidogrel  75 mg Oral Daily  . enalapril  20 mg Oral Daily  . feeding supplement (GLUCERNA SHAKE)  237 mL Oral TID BM  . FLUoxetine  40 mg Oral Daily  . furosemide  80 mg Oral  BH-q7a  . insulin aspart  0-15 Units Subcutaneous TID WC  . insulin aspart  0-5 Units Subcutaneous QHS  . [START ON 01/27/2016] insulin glargine  20 Units Subcutaneous Q supper  . levothyroxine  125 mcg Oral Q0600  . magnesium sulfate 1 - 4 g bolus IVPB  4 g Intravenous Once  . mometasone-formoterol  2 puff Inhalation BID  . pantoprazole  40 mg Oral Daily  . piperacillin-tazobactam (ZOSYN)  IV  3.375 g Intravenous Q8H  . potassium chloride  20 mEq Oral Q2H  . senna-docusate  2 tablet Oral BID  . simvastatin  20 mg Oral QHS  . tamsulosin  0.4 mg Oral Daily  . vancomycin  1,500 mg Intravenous Q24H    Assessment: Pt is a 82 year old male with osteomylitis of the foot here for amuptation of toe. Found to have hypokalemia and hypomagnesemia.  K = 3.1 (remains low despite supplementation but has increased) Mg = 1.2 Phos = 2.5 WNL  Goal of Therapy:  Electrolytes within normal limits  Plan:  Will give magnesium 4 g IV once Will order KCl 20 mEq PO every 2 hours x 2 doses per protocol  Will recheck electrolytes with AM labs tomorrow. Pharmacy to continue to follow.  Cindi CarbonMary M Jovian Lembcke, PharmD, BCPS Clinical Pharmacist 01/26/2016,7:30 PM

## 2016-01-26 NOTE — Progress Notes (Signed)
Inpatient Diabetes Program Recommendations  AACE/ADA: New Consensus Statement on Inpatient Glycemic Control (2015)  Target Ranges:  Prepandial:   less than 140 mg/dL      Peak postprandial:   less than 180 mg/dL (1-2 hours)      Critically ill patients:  140 - 180 mg/dL  Results for Almon HerculesMARTIN, Rease L (MRN [id number]) as of 01/26/2016 07:56  Ref. Range 01/25/2016 17:37 01/25/2016 21:04 01/26/2016 07:42  Glucose-Capillary Latest Ref Range: 65 - 99 mg/dL 782328 (H) 956251 (H) 213111 (H)    Review of Glycemic Control  Diabetes history: DM2 Outpatient Diabetes medications: Lantus 90 units at 16:30 with supper, Humalog 20 units TID with meals plus correction (based on glucose) Current orders for Inpatient glycemic control: Lantus 90 units daily with supper, Novolog 0-15 units TID with meals, Novolog 0-5 units QHS  Inpatient Diabetes Program Recommendations: Insulin-Meal Coverage: Please consider ordering Novolog 8 units TID with meals for meal coverage if patient eats at least 50% of meal. HgbA1C: A1C 6.7% on 01/25/16 indicating an average glucose of 146 mg/dl over the past 2-3 months which is within target A1C goals.   NOTE: Noted consult for Diabetes Coordinator. Chart reviewed. Spoke with patient over the phone regarding diabetes and outpatient regimen for DM control. Patient states that he takes: Lantus 90 units daily with supper, Humalog 20 units TID with meals plus additional Humalog based on correction scale.  Patient states that he does not skip any insulin dosages at all and his glucose "has always been up and down."  Inquired about glucose range over the past 1-2 weeks and patient states that he is not sure because he has been at a SNF so they have been checking his glucose and they don't usually tell him what his glucose is. Discussed current insulin regimen ordered as an inpatient. Informed patient a request for a portion of meal coverage would be asked for and it would be up to the doctor if they wanted  to order it. Patient verbalized understanding of information discussed and he states that he has no questions at this time related to diabetes.  Thanks, Orlando PennerMarie Seneca Gadbois, RN, MSN, CDE Diabetes Coordinator Inpatient Diabetes Program [phone] (Team Pager from 8am to 5pm)

## 2016-01-27 LAB — GLUCOSE, CAPILLARY
GLUCOSE-CAPILLARY: 120 mg/dL — AB (ref 65–99)
GLUCOSE-CAPILLARY: 296 mg/dL — AB (ref 65–99)
Glucose-Capillary: 137 mg/dL — ABNORMAL HIGH (ref 65–99)
Glucose-Capillary: 228 mg/dL — ABNORMAL HIGH (ref 65–99)

## 2016-01-27 LAB — POTASSIUM
POTASSIUM: 3.7 mmol/L (ref 3.5–5.1)
Potassium: 2.8 mmol/L — ABNORMAL LOW (ref 3.5–5.1)

## 2016-01-27 LAB — MAGNESIUM
MAGNESIUM: 2.1 mg/dL (ref 1.7–2.4)
Magnesium: 1.8 mg/dL (ref 1.7–2.4)

## 2016-01-27 MED ORDER — SODIUM CHLORIDE 0.9 % IV SOLN
30.0000 meq | Freq: Once | INTRAVENOUS | Status: AC
  Administered 2016-01-27: 30 meq via INTRAVENOUS
  Filled 2016-01-27: qty 15

## 2016-01-27 MED ORDER — POTASSIUM CHLORIDE CRYS ER 20 MEQ PO TBCR
20.0000 meq | EXTENDED_RELEASE_TABLET | Freq: Once | ORAL | Status: AC
  Administered 2016-01-27: 20 meq via ORAL
  Filled 2016-01-27: qty 1

## 2016-01-27 MED ORDER — POTASSIUM CHLORIDE CRYS ER 20 MEQ PO TBCR: 20.0000 meq | EXTENDED_RELEASE_TABLET | Freq: Two times a day (BID) | ORAL | Status: DC

## 2016-01-27 MED ORDER — MAGNESIUM OXIDE 400 (241.3 MG) MG PO TABS
400.0000 mg | ORAL_TABLET | Freq: Two times a day (BID) | ORAL | Status: DC
  Administered 2016-01-27 – 2016-02-02 (×12): 400 mg via ORAL
  Filled 2016-01-27 (×12): qty 1

## 2016-01-27 MED ORDER — POTASSIUM CHLORIDE CRYS ER 20 MEQ PO TBCR
40.0000 meq | EXTENDED_RELEASE_TABLET | Freq: Two times a day (BID) | ORAL | Status: DC
  Administered 2016-01-27 – 2016-01-28 (×2): 40 meq via ORAL
  Filled 2016-01-27 (×3): qty 2

## 2016-01-27 NOTE — Progress Notes (Signed)
Pharmacy Antibiotic Note  Jason [name] is a 82 y.o. male admitted on 01/25/2016 with osteomyelitis.  Pharmacy has been consulted for vancomycin and piperacillin/tazobactam dosing.  This is day #3 of antibiotics, plan for surgery today for debridement/amputation of right toe.   Plan: Continue piperacillin/tazobactam 3.375 g IV q8h EI  Continue vancomycin 1500 mg IV q24h Goal vancomycin trough 15-20 mcg/mL Vancomycin trough ordered for 11/12 @ 0130, which is prior to 5th dose and should represent steady state.  Kinetics: Using adjusted body weight = 88 kg Ke: 0.053 Half-life: 13 hrs Vd: 61 L Cmin estimated ~15 mcg/mL  Patient at risk for accumulation due to obesity  Height: 5\' 8"  (172.7 cm) Weight: 259 lb 12.8 oz (117.8 kg) IBW/kg (Calculated) : 68.4  Temp (24hrs), Avg:98.4 F (36.9 C), Min:98.1 F (36.7 C), Max:98.8 F (37.1 C)   Recent Labs Lab 01/25/16 1914 01/26/16 0326  WBC  --  7.9  CREATININE 1.23 1.22    Estimated Creatinine Clearance: 59.2 mL/min (by C-G formula based on SCr of 1.22 mg/dL).    No Known Allergies  Antimicrobials this admission: vancomycin 11/8 >>  Piperacillin/tazobactam 11/8 >>   Dose adjustments this admission:  Thank you for allowing pharmacy to be a part of this patient's care.  Jason [name], PharmD, BCPS Clinical Pharmacist 01/27/2016 8:02 AM

## 2016-01-27 NOTE — Consult Note (Signed)
MEDICATION RELATED CONSULT NOTE - INITIAL   Pharmacy Consult for electrolytes Indication: hypokalemia, hypomagnesemia  No Known Allergies  Patient Measurements: Height: 5\' 8"  (172.7 cm) Weight: 259 lb 12.8 oz (117.8 kg) IBW/kg (Calculated) : 68.4  Vital Signs: Temp: 98.3 F (36.8 C) (11/10 0726) Temp Source: Oral (11/10 0726) BP: 141/66 (11/10 0726) Pulse Rate: 94 (11/10 0726) Intake/Output from previous day: 11/09 0701 - 11/10 0700 In: 2775 [P.O.:360; IV Piggyback:976] Out: -  Intake/Output from this shift: No intake/output data recorded.  Labs:  Recent Labs  01/25/16 1914 01/26/16 0326 01/26/16 1811 01/27/16 0258  WBC  --  7.9  --   --   HGB  --  9.9*  --   --   HCT  --  29.2*  --   --   PLT  --  135*  --   --   CREATININE 1.23 1.22  --   --   MG  --   --  1.2* 2.1  PHOS  --   --  2.5  --    Estimated Creatinine Clearance: 59.2 mL/min (by C-G formula based on SCr of 1.22 mg/dL).  Microbiology: Recent Results (from the past 720 hour(s))  MRSA PCR Screening     Status: None   Collection Time: 01/25/16  8:01 PM  Result Value Ref Range Status   MRSA by PCR NEGATIVE NEGATIVE Final    Comment:        The GeneXpert MRSA Assay (FDA approved for NASAL specimens only), is one component of a comprehensive MRSA colonization surveillance program. It is not intended to diagnose MRSA infection nor to guide or monitor treatment for MRSA infections.     Medical History: Past Medical History:  Diagnosis Date  . BPH (benign prostatic hyperplasia)   . Constipation   . COPD (chronic obstructive pulmonary disease) (HCC)   . Diabetes mellitus without complication (HCC)   . GERD (gastroesophageal reflux disease)   . HTN (hypertension)   . Hypothyroidism   . Stroke [facility](HCC)     Medications:  Scheduled:  . aspirin EC  81 mg Oral Daily  . clopidogrel  75 mg Oral Daily  . enalapril  20 mg Oral Daily  . feeding supplement (GLUCERNA SHAKE)  237 mL Oral TID BM  .  FLUoxetine  40 mg Oral Daily  . furosemide  80 mg Oral BH-q7a  . insulin aspart  0-15 Units Subcutaneous TID WC  . insulin aspart  0-5 Units Subcutaneous QHS  . insulin glargine  20 Units Subcutaneous Q supper  . levothyroxine  125 mcg Oral Q0600  . mometasone-formoterol  2 puff Inhalation BID  . pantoprazole  40 mg Oral Daily  . piperacillin-tazobactam (ZOSYN)  IV  3.375 g Intravenous Q8H  . potassium chloride (KCL MULTIRUN) 30 mEq in 265 mL IVPB  30 mEq Intravenous Once  . potassium chloride (KCL MULTIRUN) 30 mEq in 265 mL IVPB  30 mEq Intravenous Once  . senna-docusate  2 tablet Oral BID  . simvastatin  20 mg Oral QHS  . tamsulosin  0.4 mg Oral Daily  . vancomycin  1,500 mg Intravenous Q24H    Assessment: Pt is a 82 year old male with osteomylitis of the foot here for amuptation of toe. Found to have hypokalemia and hypomagnesemia.  Both K and Mg were replaced yesterday. This morning Mg = 2.1 (WNL), but patient remains hypokalemic with K = 2.8.  Goal of Therapy:  Electrolytes within normal limits  Plan:  Patient currently  has ordered KCl 30 mEq IV x 2 doses. Will recheck potassium level this afternoon after potassium has been supplemented.   Will order KCl 20 mEq PO BID to begin tonight after surgery given patient is hypokalemic and receiving furosemide daily.  Pharmacy to continue to follow.  Cindi CarbonMary M Lamiah Marmol, PharmD, BCPS Clinical Pharmacist 01/27/2016,7:55 AM

## 2016-01-27 NOTE — Progress Notes (Signed)
* Surgery Date in Future *  Subjective: Patient seen. No complaints of pain. Had to cancel surgery due to low potassium.  Objective: Vital signs in last 24 hours: Temp:  [98.1 F (36.7 C)-98.8 F (37.1 C)] 98.3 F (36.8 C) (11/10 0726) Pulse Rate:  [88-94] 94 (11/10 0726) Resp:  [18-19] 18 (11/10 0726) BP: (115-141)/(48-66) 141/66 (11/10 0726) SpO2:  [92 %-96 %] 96 % (11/10 0726) Last BM Date: 01/25/16  Intake/Output from previous day: 11/09 0701 - 11/10 0700 In: 2775 [P.O.:360; IV Piggyback:976] Out: -  Intake/Output this shift: No intake/output data recorded.  Only mild drainage on the bandage from the right great toe and heel area. Upon removal still exposed bone through the dorsal aspect of the hallux. Fifth metatarsal ulceration appears relatively dry with some necrotic eschar. Heel ulcer is intact with eschar and some surrounding superficial and granular laceration  Lab Results:   Recent Labs  01/26/16 0326  WBC 7.9  HGB 9.9*  HCT 29.2*  PLT 135*   BMET  Recent Labs  01/25/16 1914  01/26/16 0326 01/26/16 1811 01/27/16 0258  NA  --   --  135  --   --   K  --   < > 2.4* 3.1* 2.8*  CL  --   --  94*  --   --   CO2  --   --  34*  --   --   GLUCOSE  --   --  181*  --   --   BUN  --   --  15  --   --   CREATININE 1.23  --  1.22  --   --   CALCIUM  --   --  6.9*  --   --   < > = values in this interval not displayed. PT/INR No results for input(s): LABPROT, INR in the last 72 hours. ABG No results for input(s): PHART, HCO3 in the last 72 hours.  Invalid input(s): PCO2, PO2  Studies/Results: Mr Foot Right Wo Contrast  Result Date: 01/26/2016 CLINICAL DATA:  Right great toe ulceration. EXAM: MRI OF THE RIGHT FOREFOOT WITHOUT CONTRAST TECHNIQUE: Multiplanar, multisequence MR imaging was performed. No intravenous contrast was administered. COMPARISON:  None. FINDINGS: TENDONS Peroneal: Peroneal longus tendon intact. Peroneal brevis intact. Posteromedial:  Posterior tibial tendon intact. Flexor hallucis longus tendon intact. Flexor digitorum longus tendon intact. Anterior: Tibialis anterior tendon intact. Extensor hallucis longus tendon intact Extensor digitorum longus tendon intact. Achilles:  Severe tendinosis of the distal Achilles tendon. Plantar Fascia: Intact. LIGAMENTS Lateral: Anterior talofibular ligament intact. Calcaneofibular ligament intact. Posterior talofibular ligament intact. Anterior and posterior tibiofibular ligaments intact. Medial: Deltoid ligament intact. Spring ligament intact. CARTILAGE Ankle Joint: No joint effusion. Normal ankle mortise. No chondral defect. Subtalar Joints/Sinus Tarsi: Normal subtalar joints. No subtalar joint effusion. Normal sinus tarsi. Bones: Soft tissue ulceration over the first MTP joint. Cortical disruption of the dorsal cortex along the base of the first proximal phalanx. Marrow edema in the first distal phalanx and first metatarsal head without definite cortical destruction. Soft Tissue: No fluid collection or hematoma. IMPRESSION: 1. Soft tissue ulceration over the first MTP joint. Cortical disruption of the dorsal cortex along the base of the first proximal phalanx with marrow edema most concerning for osteomyelitis. Marrow edema in the first distal phalanx and first metatarsal head without definite cortical destruction may reflect reactive marrow edema versus early osteomyelitis. 2. Severe tendinosis of the distal Achilles tendon. Electronically Signed   By: Alan RipperHetal  Patel   On: 01/26/2016 10:58    Anti-infectives: Anti-infectives    Start     Dose/Rate Route Frequency Ordered Stop   01/26/16 0600  piperacillin-tazobactam (ZOSYN) IVPB 3.375 g     3.375 g 12.5 mL/hr over 240 Minutes Intravenous Every 8 hours 01/25/16 2009     01/26/16 0200  vancomycin (VANCOCIN) 1,500 mg in sodium chloride 0.9 % 500 mL IVPB     1,500 mg 250 mL/hr over 120 Minutes Intravenous Every 24 hours 01/25/16 2008     01/25/16 2200   piperacillin-tazobactam (ZOSYN) IVPB 3.375 g  Status:  Discontinued     3.375 g 12.5 mL/hr over 240 Minutes Intravenous Every 8 hours 01/25/16 1751 01/25/16 1753   01/25/16 1830  vancomycin (VANCOCIN) IVPB 1000 mg/200 mL premix     1,000 mg 200 mL/hr over 60 Minutes Intravenous  Once 01/25/16 1752 01/25/16 2105   01/25/16 1830  piperacillin-tazobactam (ZOSYN) IVPB 3.375 g     3.375 g 12.5 mL/hr over 240 Minutes Intravenous  Once 01/25/16 1753 01/26/16 0005   01/25/16 1800  vancomycin (VANCOCIN) IVPB 1000 mg/200 mL premix  Status:  Discontinued     1,000 mg 200 mL/hr over 60 Minutes Intravenous Every 24 hours 01/25/16 1751 01/25/16 1753      Assessment/Plan: s/p Procedure(s): AMPUTATION TOE (Right) IRRIGATION AND DEBRIDEMENT FOOT (Right) Assessment: Multiple ulcerations right foot with gangrenous changes and osteomyelitis   Plan: Surgery canceled for today due to his low potassium. Rescheduled for tomorrow morning. Nothing by mouth after midnight again tonight. Dry sterile dressing reapplied to all of the ulcerative areas on the right foot and heel. Plan for surgery tomorrow  LOS: 2 days    Jason Barkerodd W Jason [name] 01/27/2016

## 2016-01-27 NOTE — Plan of Care (Signed)
Problem: Bowel/Gastric: Goal: Will not experience complications related to bowel motility Outcome: Progressing Pt had surgical intervention cancelled this shift due to electrolyte imbalance, rescheduled for tomorrow. Pt remains free of falls/injury this shift, is aware of plan of care as are family Vallarie Maremembers,and is cooperative.

## 2016-01-27 NOTE — Progress Notes (Signed)
Pt surgery cancelled per Dr. Alberteen Spindleline due to pt's potassium level. All potassium supplements ordered have been given to pt and recheck is awaited. Pt has been changed for incontinence, is now taking po well. Family at bedside. Call bell in reach

## 2016-01-27 NOTE — Progress Notes (Signed)
Shift assessment completed. Pt is alert and oriented x4, pt is on room air, lungs clear bilat, S1S2 auscultated. Abdomen is soft. bs heard. Pt has been changed for incontinence of urine, L ppp. r foot has dressing dry and intact, pp is weaker than L foot. PIV #20 intact to RAC with zosyn infusing, piv #20 intact to r wrist with potassium infusing. Pt has received chg bath in anticipation of going to OR shortly.

## 2016-01-27 NOTE — Progress Notes (Signed)
[facility] [place] [facility]   PATIENT NAME: Jason [name]    MR#:  [id number]  DATE OF BIRTH:  October 13, 1932  SUBJECTIVE:   Came in with ongoing infection of the right foot Patient denies any complaints. His family is in the room. His surgery got canceled due to low potassium. REVIEW OF SYSTEMS:   Review of Systems  Constitutional: Negative for chills, fever and weight loss.  HENT: Negative for ear discharge, ear pain and nosebleeds.   Eyes: Negative for blurred vision, pain and discharge.  Respiratory: Negative for sputum production, shortness of breath, wheezing and stridor.   Cardiovascular: Negative for chest pain, palpitations, orthopnea and PND.  Gastrointestinal: Negative for abdominal pain, diarrhea, nausea and vomiting.  Genitourinary: Negative for frequency and urgency.  Musculoskeletal: Positive for joint pain. Negative for back pain.  Neurological: Positive for weakness. Negative for sensory change, speech change and focal weakness.  Psychiatric/Behavioral: Negative for depression and hallucinations. The patient is not nervous/anxious.    Tolerating Diet:yes Tolerating PT: pending  DRUG ALLERGIES:  No Known Allergies  VITALS:  Blood pressure (!) 130/53, pulse 92, temperature 98.3 F (36.8 C), temperature source Oral, resp. rate 18, height 5\' 8"  (1.727 m), weight 117.8 kg (259 lb 12.8 oz), SpO2 95 %.  PHYSICAL EXAMINATION:   Physical Exam  GENERAL:  82 y.o.-year-old patient lying in the bed with no acute distress.  EYES: Pupils equal, round, reactive to light and accommodation. No scleral icterus. Extraocular muscles intact.  HEENT: Head atraumatic, normocephalic. Oropharynx and nasopharynx clear.  NECK:  Supple, no jugular venous distention. No thyroid enlargement, no tenderness.  LUNGS: Normal breath sounds bilaterally, no wheezing, rales, rhonchi. No use of accessory muscles of respiration.  CARDIOVASCULAR: S1, S2 normal. No  murmurs, rubs, or gallops.  ABDOMEN: Soft, nontender, nondistended. Bowel sounds present. No organomegaly or mass.  EXTREMITIES: No cyanosis, clubbing or edema b/l.   Right chronic nonhealling ulcers/infection of the great toe and 5th digit NEUROLOGIC: Cranial nerves II through XII are intact. No focal Motor or sensory deficits b/l.   PSYCHIATRIC:  patient is alert and oriented x 3.  SKIN: No obvious rash, lesion, or ulcer.   LABORATORY PANEL:  CBC  Recent Labs Lab 01/26/16 0326  WBC 7.9  HGB 9.9*  HCT 29.2*  PLT 135*    Chemistries   Recent Labs Lab 01/26/16 0326  01/27/16 1354  NA 135  --   --   K 2.4*  < > 3.7  CL 94*  --   --   CO2 34*  --   --   GLUCOSE 181*  --   --   BUN 15  --   --   CREATININE 1.22  --   --   CALCIUM 6.9*  --   --   MG  --   < > 1.8  < > = values in this interval not displayed. Cardiac Enzymes No results for input(s): TROPONINI in the last 168 hours. RADIOLOGY:  Mr Foot Right Wo Contrast  Result Date: 01/26/2016 CLINICAL DATA:  Right great toe ulceration. EXAM: MRI OF THE RIGHT FOREFOOT WITHOUT CONTRAST TECHNIQUE: Multiplanar, multisequence MR imaging was performed. No intravenous contrast was administered. COMPARISON:  None. FINDINGS: TENDONS Peroneal: Peroneal longus tendon intact. Peroneal brevis intact. Posteromedial: Posterior tibial tendon intact. Flexor hallucis longus tendon intact. Flexor digitorum longus tendon intact. Anterior: Tibialis anterior tendon intact. Extensor hallucis longus tendon intact Extensor digitorum longus tendon intact. Achilles:  Severe tendinosis of  the distal Achilles tendon. Plantar Fascia: Intact. LIGAMENTS Lateral: Anterior talofibular ligament intact. Calcaneofibular ligament intact. Posterior talofibular ligament intact. Anterior and posterior tibiofibular ligaments intact. Medial: Deltoid ligament intact. Spring ligament intact. CARTILAGE Ankle Joint: No joint effusion. Normal ankle mortise. No chondral defect.  Subtalar Joints/Sinus Tarsi: Normal subtalar joints. No subtalar joint effusion. Normal sinus tarsi. Bones: Soft tissue ulceration over the first MTP joint. Cortical disruption of the dorsal cortex along the base of the first proximal phalanx. Marrow edema in the first distal phalanx and first metatarsal head without definite cortical destruction. Soft Tissue: No fluid collection or hematoma. IMPRESSION: 1. Soft tissue ulceration over the first MTP joint. Cortical disruption of the dorsal cortex along the base of the first proximal phalanx with marrow edema most concerning for osteomyelitis. Marrow edema in the first distal phalanx and first metatarsal head without definite cortical destruction may reflect reactive marrow edema versus early osteomyelitis. 2. Severe tendinosis of the distal Achilles tendon. Electronically Signed   By: Elige KoHetal  Brihana Quickel   On: 01/26/2016 10:58   ASSESSMENT AND PLAN:  82 year old male history of insulin requiring type 2 diabetes presenting with foot ulcer.  1. Right foot osteomyelitis: podiatry consult with Dr Alberteen Spindleline noted -plans for surgery tomorrow am. Surgical canceled today due to low potassium. - start antibiotics -MRI right foot results d/w wife and son in the room  2. Type 2 diabetes insulin requiring: restart basal insulin with sliding coverage, diabetic consult   3. Hypothyroidism unspecified: synthroid  4. Essential hypertension: vasotec  5. DVT prophylaxis Lovneox  6. Hypokalemia repleted orally and IV  Case discussed with Care Management/Social Worker. Management plans discussed with the patient, family and they are in agreement.  CODE STATUS: FUll DVT Prophylaxis: loveonox TOTAL TIME TAKING CARE OF THIS PATIENT: 30 minutes.  >50% time spent on counselling and coordination of care pt, wife ans son  POSSIBLE D/C IN 1-2 DAYS, DEPENDING ON CLINICAL CONDITION.  Note: This dictation was prepared with Dragon dictation along with smaller phrase technology.  Any transcriptional errors that result from this process are unintentional.  Belkys Henault M.D on 01/27/2016 at 2:14 PM  Between 7am to 6pm - Pager - [phone]  After 6pm go to [email or web address] - password [email or web address] [facility]  [facility]  Office  [phone]  CC: Primary care physician; Dione Housekeeperlmedo, Mario Ernesto, MD

## 2016-01-27 NOTE — Progress Notes (Signed)
Per chart patient will have surgery tomorrow. Per RN in progression rounds patient will not D/C over the weekend. Plan is [facility]. Linda [facility] coordinator [facility] is aware of above. Clinical Social Worker (CSW) will continue to follow and assist as needed.   Baker Hughes IncorporatedBailey Joscelin Fray, LCSW [phone]

## 2016-01-27 NOTE — Progress Notes (Signed)
Dr Allena KatzPatel paged and responded. Explained that pt has order for 80mg  lasix every am, this does not appear to be a home medication, and med was held this am in lieu of Or. Per Dr. Allena KatzPatel, d/c this med.

## 2016-01-27 NOTE — Interval H&P Note (Signed)
History and Physical Interval Note:  01/27/2016 7:21 AM  Jason [name]  has presented today for surgery, with the diagnosis of N/A  The various methods of treatment have been discussed with the patient and family. After consideration of risks, benefits and other options for treatment, the patient has consented to  Procedure(s): AMPUTATION TOE (Right) IRRIGATION AND DEBRIDEMENT FOOT (Right) as a surgical intervention .  The patient's history has been reviewed, patient examined, no change in status, stable for surgery.  I have reviewed the patient's chart and labs.  Questions were answered to the patient's satisfaction.     Ricci Barkerodd W Vincent Ehrler

## 2016-01-27 NOTE — Consult Note (Signed)
MEDICATION RELATED CONSULT NOTE - INITIAL   Pharmacy Consult for electrolytes Indication: hypokalemia, hypomagnesemia  No Known Allergies  Patient Measurements: Height: 5\' 8"  (172.7 cm) Weight: 259 lb 12.8 oz (117.8 kg) IBW/kg (Calculated) : 68.4  Vital Signs: Temp: 98.3 F (36.8 C) (11/10 1409) Temp Source: Oral (11/10 1409) BP: 130/53 (11/10 1409) Pulse Rate: 92 (11/10 1409) Intake/Output from previous day: 11/09 0701 - 11/10 0700 In: 2775 [P.O.:360; IV Piggyback:976] Out: -  Intake/Output from this shift: No intake/output data recorded.  Labs:  Recent Labs  01/25/16 1914 01/26/16 0326 01/26/16 1811 01/27/16 0258 01/27/16 1354  WBC  --  7.9  --   --   --   HGB  --  9.9*  --   --   --   HCT  --  29.2*  --   --   --   PLT  --  135*  --   --   --   CREATININE 1.23 1.22  --   --   --   MG  --   --  1.2* 2.1 1.8  PHOS  --   --  2.5  --   --    Estimated Creatinine Clearance: 59.2 mL/min (by C-G formula based on SCr of 1.22 mg/dL).  Microbiology: Recent Results (from the past 720 hour(s))  MRSA PCR Screening     Status: None   Collection Time: 01/25/16  8:01 PM  Result Value Ref Range Status   MRSA by PCR NEGATIVE NEGATIVE Final    Comment:        The GeneXpert MRSA Assay (FDA approved for NASAL specimens only), is one component of a comprehensive MRSA colonization surveillance program. It is not intended to diagnose MRSA infection nor to guide or monitor treatment for MRSA infections.     Medical History: Past Medical History:  Diagnosis Date  . BPH (benign prostatic hyperplasia)   . Constipation   . COPD (chronic obstructive pulmonary disease) (HCC)   . Diabetes mellitus without complication (HCC)   . GERD (gastroesophageal reflux disease)   . HTN (hypertension)   . Hypothyroidism   . Stroke [facility](HCC)     Assessment: Pt is a 82 year old male with osteomylitis of the foot here for amuptation of toe. Found to have hypokalemia and  hypomagnesemia.  Toe amputation today was postponed due to hypokalemia. Electrolytes replaced this morning with 30 mEq KCl IV x 2 doses.  Afternoon labs: K = 3.7, Mg = 1.8 - both WNL  Goal of Therapy:  Electrolytes within normal limits  Plan:  Will order scheduled supplementation as electrolytes have remained low and patient is received furosemide daily.  Mag-ox 400 mg PO BID as Mg is trending down Will give KCl 20 mEq PO once now.  Ordered KCl 40 mEq PO BID beginning tonight per discussion with MD.  Will recheck electrolytes with AM labs tomorrow.  Pharmacy to continue to follow.  Cindi CarbonMary M Taeshawn Helfman, PharmD, BCPS Clinical Pharmacist 01/27/2016,2:33 PM

## 2016-01-27 NOTE — Plan of Care (Signed)
Problem: Education: Goal: Knowledge of [facility] Education information/materials will improve Outcome: Progressing VSS, free of falls during shift.  Denies pain, nausea.  Reports hunger d/t NPO status, reinforced importance and reason for NPO.  No other complaints overnight.  R foot dsg changed.  Bed in low position, call bell within reach.  WCTM.

## 2016-01-27 NOTE — H&P (View-Only) (Signed)
Reason for Consult: Multiple ulcerations right foot with exposed bone on the great toe Referring Physician: Hospitalist  Jason [name] is an 82 y.o. male.  HPI: Patient relates a history of ulcerations on his right foot for the last few weeks. States that 2 weeks ago he did have revascularization of the right lower extremity for limb threatening ischemia. He has been in a local nursing home where his wound care has been managed. Was seen outpatient in the office today with exposed bone to the right great toe with multiple ulcerations on the right foot and decision was made for admission and definitive amputations and or debridements  Past Medical History:  Diagnosis Date  . BPH (benign prostatic hyperplasia)   . Constipation   . COPD (chronic obstructive pulmonary disease) (HCC)   . Diabetes mellitus without complication (HCC)   . GERD (gastroesophageal reflux disease)   . HTN (hypertension)   . Hypothyroidism   . Stroke (HCC)     Past Surgical History:  Procedure Laterality Date  . PERIPHERAL VASCULAR CATHETERIZATION Right 01/10/2016   Procedure: Lower Extremity Angiography;  Surgeon: Gregory G Schnier, MD;  Location: [facility] INVASIVE CV LAB;  Service: Cardiovascular;  Laterality: Right;  . PERIPHERAL VASCULAR CATHETERIZATION N/A 01/10/2016   Procedure: Abdominal Aortogram w/Lower Extremity;  Surgeon: Gregory G Schnier, MD;  Location: [facility] INVASIVE CV LAB;  Service: Cardiovascular;  Laterality: N/A;  . PERIPHERAL VASCULAR CATHETERIZATION  01/10/2016   Procedure: Lower Extremity Intervention;  Surgeon: Gregory G Schnier, MD;  Location: [facility] INVASIVE CV LAB;  Service: Cardiovascular;;  . ROTATOR CUFF REPAIR Left   . THYROID SURGERY      Family History  Problem Relation Age of Onset  . Diabetes Mother   . Thyroid disease Mother   . Pancreatic cancer Father     Social History:  reports that he has quit smoking. He has never used smokeless tobacco. He reports that he does not drink  alcohol or use drugs.  Allergies: No Known Allergies  Medications:  Scheduled: . aspirin EC  81 mg Oral Daily  . clopidogrel  75 mg Oral Daily  . enalapril  20 mg Oral Daily  . [START ON 01/26/2016] FLUoxetine  40 mg Oral Daily  . [START ON 01/26/2016] furosemide  80 mg Oral [id number]  . insulin aspart  0-15 Units Subcutaneous TID WC  . insulin aspart  0-5 Units Subcutaneous QHS  . Insulin Glargine  90 Units Subcutaneous BID  . [START ON 01/26/2016] levothyroxine  125 mcg Oral Q0600  . mometasone-formoterol  2 puff Inhalation BID  . pantoprazole  40 mg Oral Daily  . piperacillin-tazobactam (ZOSYN)  IV  3.375 g Intravenous Once  . [START ON 01/26/2016] piperacillin-tazobactam (ZOSYN)  IV  3.375 g Intravenous Q8H  . simvastatin  20 mg Oral QHS  . tamsulosin  0.4 mg Oral Daily  . [START ON 01/26/2016] vancomycin  1,500 mg Intravenous Q24H    Results for orders placed or performed during the hospital encounter of 01/25/16 (from the past 48 hour(s))  Glucose, capillary     Status: Abnormal   Collection Time: 01/25/16  5:37 PM  Result Value Ref Range   Glucose-Capillary 328 (H) 65 - 99 mg/dL  Creatinine, serum     Status: Abnormal   Collection Time: 01/25/16  7:14 PM  Result Value Ref Range   Creatinine, Ser 1.23 0.61 - 1.24 mg/dL   GFR calc non Af Amer 53 (L) >60 mL/min   GFR calc Af Amer >60 >  60 mL/min    Comment: (NOTE) The eGFR has been calculated using the CKD EPI equation. This calculation has not been validated in all clinical situations. eGFR's persistently <60 mL/min signify possible Chronic Kidney Disease.     No results found.  Review of Systems  Constitutional: Negative for chills and fever.  HENT: Negative.   Eyes: Negative.   Respiratory: Negative.   Cardiovascular: Negative.   Gastrointestinal: Negative for nausea and vomiting.  Genitourinary: Negative.   Skin:       Patient has had an open sore on his right great toe for the past several weeks. Also has an  ulcer on his right heel  Neurological:       Patient relates significant numbness in both lower extremities related to his diabetes  Endo/Heme/Allergies: Negative.   Psychiatric/Behavioral: Negative.    Blood pressure (!) 126/51, pulse 92, temperature 98.1 F (36.7 C), temperature source Oral, resp. rate 18, height 5' 8" (1.727 m), weight 117.8 kg (259 lb 12.8 oz), SpO2 99 %. Physical Exam  Cardiovascular:  DP and PT pulses are palpable but diminished bilateral.  Musculoskeletal:  Adequate range of motion of the pedal joints. Exposed bone through the dorsal aspect of the right great toe. Muscle testing is deferred.  Neurological:  Complete loss of protective threshold monofilament wire in the forefoot and digits bilateral. Proprioception is impaired  Skin:  Skin is warm dry and atrophic bilateral HAIR growth. Some bilateral hyperpigmentation's. Some edema is present in the right foot. Full-thickness ulceration over the dorsum of the right hallux with exposed bone of the proximal phalanx protruding through the wound. Full-thickness ulceration with necrosis of underlying subcutaneous and deeper muscular tissue beneath the fifth metatarsal head on the right foot. Large intact eschar with full-thickness ulceration which is dry on the plantar medial aspect of the right heel.    Assessment/Plan: Assessment: 1. Full-thickness ulceration with osteomyelitis and exposed bone right great toe. 2. Full-thickness ulceration with necrosis of muscle right fifth metatarsal. 3. Full-thickness ulceration right heel. 4. Diabetes with associated neuropathy. 5. Peripheral vascular disease  Plan: Discussed with the patient the need for amputation of the right great toe as well as a partial ray resection. Also for debridement of the ulceration beneath the right fifth metatarsal and heel. Extent of debridement will depend on the MRI findings which is scheduled for tomorrow. We will discuss surgery in more detail  tomorrow pending the results of his MRI. At this point plan for nothing by mouth after midnight tomorrow night with surgery for Friday morning  Jason [name] 01/25/2016, 8:58 PM     

## 2016-01-28 ENCOUNTER — Encounter
Admission: AD | Disposition: A | Discharge status: Skilled Nursing Facility | Payer: Self-pay | Source: Ambulatory Visit | Attending: Internal Medicine

## 2016-01-28 ENCOUNTER — Inpatient Hospital Stay: Payer: Medicare Other | Admitting: Registered Nurse

## 2016-01-28 HISTORY — PX: IRRIGATION AND DEBRIDEMENT FOOT: SHX6602

## 2016-01-28 HISTORY — PX: AMPUTATION TOE: SHX6595

## 2016-01-28 LAB — GLUCOSE, CAPILLARY
GLUCOSE-CAPILLARY: 287 mg/dL — AB (ref 65–99)
GLUCOSE-CAPILLARY: 322 mg/dL — AB (ref 65–99)
Glucose-Capillary: 241 mg/dL — ABNORMAL HIGH (ref 65–99)
Glucose-Capillary: 271 mg/dL — ABNORMAL HIGH (ref 65–99)

## 2016-01-28 LAB — CREATININE, SERUM
CREATININE: 1.25 mg/dL — AB (ref 0.61–1.24)
GFR, EST NON AFRICAN AMERICAN: 52 mL/min — AB (ref >60)

## 2016-01-28 LAB — MAGNESIUM: MAGNESIUM: 1.7 mg/dL (ref 1.7–2.4)

## 2016-01-28 LAB — POTASSIUM: POTASSIUM: 3.9 mmol/L (ref 3.5–5.1)

## 2016-01-28 LAB — PHOSPHORUS: PHOSPHORUS: 2.5 mg/dL (ref 2.5–4.6)

## 2016-01-28 SURGERY — AMPUTATION, TOE
Anesthesia: General | Laterality: Right

## 2016-01-28 MED ORDER — ONDANSETRON HCL 4 MG/2ML IJ SOLN
INTRAMUSCULAR | Status: AC
  Filled 2016-01-28: qty 2

## 2016-01-28 MED ORDER — FENTANYL CITRATE (PF) 100 MCG/2ML IJ SOLN: 25.0000 ug | INTRAMUSCULAR | Status: DC | PRN

## 2016-01-28 MED ORDER — LACTATED RINGERS IV SOLN
INTRAVENOUS | Status: DC | PRN
  Administered 2016-01-28: 08:00:00 via INTRAVENOUS

## 2016-01-28 MED ORDER — OXYCODONE-ACETAMINOPHEN 5-325 MG PO TABS
1.0000 | ORAL_TABLET | ORAL | Status: DC | PRN
  Administered 2016-01-28 – 2016-01-29 (×2): 2 via ORAL
  Filled 2016-01-28 (×2): qty 2

## 2016-01-28 MED ORDER — OXYCODONE-ACETAMINOPHEN 5-325 MG PO TABS
ORAL_TABLET | ORAL | Status: AC
  Administered 2016-01-28: 2
  Filled 2016-01-28: qty 2

## 2016-01-28 MED ORDER — OXYCODONE HCL 5 MG/5ML PO SOLN: 5.0000 mg | Freq: Once | ORAL | Status: DC | PRN

## 2016-01-28 MED ORDER — ONDANSETRON HCL 4 MG/2ML IJ SOLN
4.0000 mg | Freq: Four times a day (QID) | INTRAMUSCULAR | Status: DC | PRN
  Administered 2016-01-28 – 2016-02-10 (×13): 4 mg via INTRAVENOUS
  Filled 2016-01-28 (×12): qty 2

## 2016-01-28 MED ORDER — LIDOCAINE HCL (PF) 1 % IJ SOLN
INTRAMUSCULAR | Status: AC
Start: 2016-01-28 — End: 2016-01-28
  Filled 2016-01-28: qty 30

## 2016-01-28 MED ORDER — MEPERIDINE HCL 25 MG/ML IJ SOLN: 6.2500 mg | INTRAMUSCULAR | Status: DC | PRN

## 2016-01-28 MED ORDER — THROMBIN 5000 UNITS EX SOLR
CUTANEOUS | Status: AC
  Filled 2016-01-28: qty 5000

## 2016-01-28 MED ORDER — BUPIVACAINE HCL (PF) 0.5 % IJ SOLN
INTRAMUSCULAR | Status: AC
  Filled 2016-01-28: qty 30

## 2016-01-28 MED ORDER — PROMETHAZINE HCL 25 MG/ML IJ SOLN: 6.2500 mg | INTRAMUSCULAR | Status: DC | PRN

## 2016-01-28 MED ORDER — EPHEDRINE SULFATE 50 MG/ML IJ SOLN
INTRAMUSCULAR | Status: DC | PRN
  Administered 2016-01-28: 20 mg via INTRAVENOUS

## 2016-01-28 MED ORDER — SODIUM CHLORIDE 0.9 % IV BOLUS (SEPSIS)
500.0000 mL | Freq: Once | INTRAVENOUS | Status: AC
  Administered 2016-01-28: 500 mL via INTRAVENOUS

## 2016-01-28 MED ORDER — VANCOMYCIN HCL 1000 MG IV SOLR
INTRAVENOUS | Status: AC
  Filled 2016-01-28: qty 2000

## 2016-01-28 MED ORDER — PHENYLEPHRINE HCL 10 MG/ML IJ SOLN
INTRAMUSCULAR | Status: DC | PRN
  Administered 2016-01-28 (×3): 50 ug via INTRAVENOUS

## 2016-01-28 MED ORDER — OXYCODONE HCL 5 MG PO TABS: 5.0000 mg | ORAL_TABLET | Freq: Once | ORAL | Status: DC | PRN

## 2016-01-28 MED ORDER — NEOMYCIN-POLYMYXIN B GU 40-200000 IR SOLN
Status: AC
  Filled 2016-01-28: qty 2

## 2016-01-28 SURGICAL SUPPLY — 61 items
BANDAGE ACE 4X5 VEL STRL LF (GAUZE/BANDAGES/DRESSINGS) ×3 IMPLANT
BANDAGE ELASTIC 4 LF NS (GAUZE/BANDAGES/DRESSINGS) ×3 IMPLANT
BLADE MED AGGRESSIVE (BLADE) ×3 IMPLANT
BLADE OSC/SAGITTAL MD 5.5X18 (BLADE) ×3 IMPLANT
BLADE OSCILLATING/SAGITTAL (BLADE) ×2
BLADE SURG 15 STRL LF DISP TIS (BLADE) ×2 IMPLANT
BLADE SURG 15 STRL SS (BLADE) ×4
BLADE SURG MINI STRL (BLADE) ×3 IMPLANT
BLADE SW THK.38XMED LNG THN (BLADE) ×1 IMPLANT
BNDG ESMARK 4X12 TAN STRL LF (GAUZE/BANDAGES/DRESSINGS) ×3 IMPLANT
BNDG GAUZE 4.5X4.1 6PLY STRL (MISCELLANEOUS) ×6 IMPLANT
CANISTER SUCT 1200ML W/VALVE (MISCELLANEOUS) ×3 IMPLANT
CLOSURE WOUND 1/4X4 (GAUZE/BANDAGES/DRESSINGS)
CUFF TOURN 18 STER (MISCELLANEOUS) ×3 IMPLANT
CUFF TOURN DUAL PL 12 NO SLV (MISCELLANEOUS) IMPLANT
DRAPE FLUOR MINI C-ARM 54X84 (DRAPES) IMPLANT
DRSG MEPITEL 4X7.2 (GAUZE/BANDAGES/DRESSINGS) ×3 IMPLANT
DURAPREP 26ML APPLICATOR (WOUND CARE) ×3 IMPLANT
ELECT REM PT RETURN 9FT ADLT (ELECTROSURGICAL) ×3
ELECTRODE REM PT RTRN 9FT ADLT (ELECTROSURGICAL) ×1 IMPLANT
GAUZE FLUFF 18X24 1PLY STRL (GAUZE/BANDAGES/DRESSINGS) ×3 IMPLANT
GAUZE PETRO XEROFOAM 1X8 (MISCELLANEOUS) ×3 IMPLANT
GAUZE SPONGE 4X4 12PLY STRL (GAUZE/BANDAGES/DRESSINGS) ×6 IMPLANT
GAUZE STRETCH 2X75IN STRL (MISCELLANEOUS) ×3 IMPLANT
GLOVE BIO SURGEON STRL SZ7.5 (GLOVE) ×9 IMPLANT
GLOVE INDICATOR 8.0 STRL GRN (GLOVE) ×3 IMPLANT
GOWN STRL REUS W/ TWL LRG LVL3 (GOWN DISPOSABLE) ×2 IMPLANT
GOWN STRL REUS W/TWL LRG LVL3 (GOWN DISPOSABLE) ×4
HANDPIECE VERSAJET DEBRIDEMENT (MISCELLANEOUS) ×6 IMPLANT
IV NS 1000ML (IV SOLUTION) ×2
IV NS 1000ML BAXH (IV SOLUTION) ×1 IMPLANT
KIT RM TURNOVER STRD PROC AR (KITS) ×3 IMPLANT
KIT STIMULAN RAPID CURE 5CC (Orthopedic Implant) ×3 IMPLANT
LABEL OR SOLS (LABEL) ×3 IMPLANT
NEEDLE FILTER BLUNT 18X 1/2SAF (NEEDLE) ×2
NEEDLE FILTER BLUNT 18X1 1/2 (NEEDLE) ×1 IMPLANT
NEEDLE HYPO 25X1 1.5 SAFETY (NEEDLE) ×9 IMPLANT
NS IRRIG 500ML POUR BTL (IV SOLUTION) ×3 IMPLANT
PACK EXTREMITY ARMC (MISCELLANEOUS) ×3 IMPLANT
PAD ABD DERMACEA PRESS 5X9 (GAUZE/BANDAGES/DRESSINGS) ×9 IMPLANT
PENCIL ELECTRO HAND CTR (MISCELLANEOUS) ×3 IMPLANT
RASP SM TEAR CROSS CUT (RASP) IMPLANT
SOL .9 NS 3000ML IRR  AL (IV SOLUTION)
SOL .9 NS 3000ML IRR UROMATIC (IV SOLUTION) IMPLANT
SOL PREP PVP 2OZ (MISCELLANEOUS) ×6
SOLUTION PREP PVP 2OZ (MISCELLANEOUS) ×2 IMPLANT
SPOGE SURGIFLO 8M (HEMOSTASIS) ×2
SPONGE SURGIFLO 8M (HEMOSTASIS) ×1 IMPLANT
STOCKINETTE STRL 4IN 9604848 (GAUZE/BANDAGES/DRESSINGS) ×3 IMPLANT
STOCKINETTE STRL 6IN 960660 (GAUZE/BANDAGES/DRESSINGS) ×3 IMPLANT
STRIP CLOSURE SKIN 1/4X4 (GAUZE/BANDAGES/DRESSINGS) IMPLANT
SUT ETHILON 4-0 (SUTURE) ×4
SUT ETHILON 4-0 FS2 18XMFL BLK (SUTURE) ×2
SUT ETHILON 5-0 FS-2 18 BLK (SUTURE) ×3 IMPLANT
SUT VIC AB 3-0 SH 27 (SUTURE)
SUT VIC AB 3-0 SH 27X BRD (SUTURE) IMPLANT
SUT VIC AB 4-0 FS2 27 (SUTURE) ×3 IMPLANT
SUTURE ETHLN 4-0 FS2 18XMF BLK (SUTURE) ×2 IMPLANT
SWAB DUAL CULTURE TRANS RED ST (MISCELLANEOUS) ×3 IMPLANT
SYR 3ML LL SCALE MARK (SYRINGE) ×3 IMPLANT
SYRINGE 10CC LL (SYRINGE) ×6 IMPLANT

## 2016-01-28 NOTE — H&P (View-Only) (Signed)
* Surgery Date in Future *  Subjective: Patient seen. No complaints of pain. Had to cancel surgery due to low potassium.  Objective: Vital signs in last 24 hours: Temp:  [98.1 F (36.7 C)-98.8 F (37.1 C)] 98.3 F (36.8 C) (11/10 0726) Pulse Rate:  [88-94] 94 (11/10 0726) Resp:  [18-19] 18 (11/10 0726) BP: (115-141)/(48-66) 141/66 (11/10 0726) SpO2:  [92 %-96 %] 96 % (11/10 0726) Last BM Date: 01/25/16  Intake/Output from previous day: 11/09 0701 - 11/10 0700 In: 2775 [P.O.:360; IV Piggyback:976] Out: -  Intake/Output this shift: No intake/output data recorded.  Only mild drainage on the bandage from the right great toe and heel area. Upon removal still exposed bone through the dorsal aspect of the hallux. Fifth metatarsal ulceration appears relatively dry with some necrotic eschar. Heel ulcer is intact with eschar and some surrounding superficial and granular laceration  Lab Results:   Recent Labs  01/26/16 0326  WBC 7.9  HGB 9.9*  HCT 29.2*  PLT 135*   BMET  Recent Labs  01/25/16 1914  01/26/16 0326 01/26/16 1811 01/27/16 0258  NA  --   --  135  --   --   K  --   < > 2.4* 3.1* 2.8*  CL  --   --  94*  --   --   CO2  --   --  34*  --   --   GLUCOSE  --   --  181*  --   --   BUN  --   --  15  --   --   CREATININE 1.23  --  1.22  --   --   CALCIUM  --   --  6.9*  --   --   < > = values in this interval not displayed. PT/INR No results for input(s): LABPROT, INR in the last 72 hours. ABG No results for input(s): PHART, HCO3 in the last 72 hours.  Invalid input(s): PCO2, PO2  Studies/Results: Mr Foot Right Wo Contrast  Result Date: 01/26/2016 CLINICAL DATA:  Right great toe ulceration. EXAM: MRI OF THE RIGHT FOREFOOT WITHOUT CONTRAST TECHNIQUE: Multiplanar, multisequence MR imaging was performed. No intravenous contrast was administered. COMPARISON:  None. FINDINGS: TENDONS Peroneal: Peroneal longus tendon intact. Peroneal brevis intact. Posteromedial:  Posterior tibial tendon intact. Flexor hallucis longus tendon intact. Flexor digitorum longus tendon intact. Anterior: Tibialis anterior tendon intact. Extensor hallucis longus tendon intact Extensor digitorum longus tendon intact. Achilles:  Severe tendinosis of the distal Achilles tendon. Plantar Fascia: Intact. LIGAMENTS Lateral: Anterior talofibular ligament intact. Calcaneofibular ligament intact. Posterior talofibular ligament intact. Anterior and posterior tibiofibular ligaments intact. Medial: Deltoid ligament intact. Spring ligament intact. CARTILAGE Ankle Joint: No joint effusion. Normal ankle mortise. No chondral defect. Subtalar Joints/Sinus Tarsi: Normal subtalar joints. No subtalar joint effusion. Normal sinus tarsi. Bones: Soft tissue ulceration over the first MTP joint. Cortical disruption of the dorsal cortex along the base of the first proximal phalanx. Marrow edema in the first distal phalanx and first metatarsal head without definite cortical destruction. Soft Tissue: No fluid collection or hematoma. IMPRESSION: 1. Soft tissue ulceration over the first MTP joint. Cortical disruption of the dorsal cortex along the base of the first proximal phalanx with marrow edema most concerning for osteomyelitis. Marrow edema in the first distal phalanx and first metatarsal head without definite cortical destruction may reflect reactive marrow edema versus early osteomyelitis. 2. Severe tendinosis of the distal Achilles tendon. Electronically Signed   By: Hetal    Patel   On: 01/26/2016 10:58    Anti-infectives: Anti-infectives    Start     Dose/Rate Route Frequency Ordered Stop   01/26/16 0600  piperacillin-tazobactam (ZOSYN) IVPB 3.375 g     3.375 g 12.5 mL/hr over 240 Minutes Intravenous Every 8 hours 01/25/16 2009     01/26/16 0200  vancomycin (VANCOCIN) 1,500 mg in sodium chloride 0.9 % 500 mL IVPB     1,500 mg 250 mL/hr over 120 Minutes Intravenous Every 24 hours 01/25/16 2008     01/25/16 2200   piperacillin-tazobactam (ZOSYN) IVPB 3.375 g  Status:  Discontinued     3.375 g 12.5 mL/hr over 240 Minutes Intravenous Every 8 hours 01/25/16 1751 01/25/16 1753   01/25/16 1830  vancomycin (VANCOCIN) IVPB 1000 mg/200 mL premix     1,000 mg 200 mL/hr over 60 Minutes Intravenous  Once 01/25/16 1752 01/25/16 2105   01/25/16 1830  piperacillin-tazobactam (ZOSYN) IVPB 3.375 g     3.375 g 12.5 mL/hr over 240 Minutes Intravenous  Once 01/25/16 1753 01/26/16 0005   01/25/16 1800  vancomycin (VANCOCIN) IVPB 1000 mg/200 mL premix  Status:  Discontinued     1,000 mg 200 mL/hr over 60 Minutes Intravenous Every 24 hours 01/25/16 1751 01/25/16 1753      Assessment/Plan: s/p Procedure(s): AMPUTATION TOE (Right) IRRIGATION AND DEBRIDEMENT FOOT (Right) Assessment: Multiple ulcerations right foot with gangrenous changes and osteomyelitis   Plan: Surgery canceled for today due to his low potassium. Rescheduled for tomorrow morning. Nothing by mouth after midnight again tonight. Dry sterile dressing reapplied to all of the ulcerative areas on the right foot and heel. Plan for surgery tomorrow  LOS: 2 days    Ricci Barkerodd W Vincen Bejar 01/27/2016

## 2016-01-28 NOTE — Progress Notes (Signed)
MD on call notified of pt low bp and need for pain medication. Ordered to do bolus and tylenol for now until bp up

## 2016-01-28 NOTE — Op Note (Signed)
Date of operation: 01/28/2016.  Surgeon: Ricci Barkerodd W Antar Milks DPM.  Preoperative diagnosis: 1. Osteomyelitis with gangrenous changes right great toe. 2. Full-thickness ulceration right fifth metatarsal. 3. Ulceration right heel.  Postoperative diagnosis: Same.  Procedures: 1. Amputation right great toe with partial first ray resection. 2. Excisional debridement ulceration right fifth metatarsal full-thickness to joint capsule.   Anesthesia: LMA.  Hemostasis: Pneumatic tourniquet right ankle 250 mmHg.  Estimated blood loss: Approximately 100 cc.  Pathology: Right great toe and first metatarsal.  Implants: Stimulan rapid cure antibiotic beads impregnated with vancomycin.                 Surgi-Flo injection with thrombin for hemostasis  Cultures: Wound culture right first metatarsophalangeal joint.  Complications: None apparent.  Operative indications: This is an 82 year old male with a history of diabetes and peripheral vascular disease who recently underwent revascularization of the right lower extremity couple of weeks ago. Multiple ulcerations on the right foot and heel with exposed bone causing admission to the hospital for definitive debridements and treatment.  Operative procedure: Patient was taken the operating room and placed on the table in the supine position. Going satisfactory LMA anesthesia a pneumatic tourniquet was applied at the level of the right ankle and foot was prepped and draped in usual sterile fashion. Attention was then directed to the first metatarsophalangeal joint area where a large full-thickness ulceration with exposed bone was noted over the dorsum of the great toe. A medial incision along the first metatarsal approximately 2 cm in length was performed and the incision carried sharply down to the level of the bone. The viable margins of the great toe were then incised away from the full-thickness dorsal ulceration around the medial and lateral aspect of the toe and  around the distal tip of the toe. Dissection was carried sharply down to the bone where the bone from the distal and proximal phalanx was carefully dissected away from the viable plantar tissues. First metatarsophalangeal joint was dissected away from the surrounding normal tissues as well. Significant bleeding was noted throughout the procedure. At this point the distal portion of the first metatarsal was transected using a sagittal saw and the distal first metatarsal and great toe were then resected and removed in toto. Some devitalized tissue was noted along the lateral aspect of the joint space. Using a versa jet debrider on a setting of 6 the soft tissues were debrided down to good healthy bleeding tissues. Again there was noted be significant bleeding at this point. The incision medially as well as the distal flap of the plantar aspect of the toe was folded dorsally and partially sutured together using 3-0 nylon simple interrupted sutures. At this point the foot was exsanguinated and the tourniquet inflated to 250 mmHg. Surgi-Flo activated with thrombin was then injected in to the first metatarsophalangeal joint area to assist with hemostasis. Stimulan rapid cure antibiotic beads impregnated with vancomycin were then packed into the wound as well and the remainder of the distal aspect of the toe was sutured covering the original defect dorsally. Complete closure of the defect and wound was achieved. Attention was then directed to the lateral aspect of the foot beneath the fifth metatarsal phalangeal joint where a full-thickness eschar with underlying necrotic tissue was noted. This was sharply and excisionally excised using a 15 blade and Beaver blade down to the level of the joint capsule including the deep and superficial subcutaneous tissue. There did not appear to be any breach in the  joint capsule or exposure of bone so the wound was then debrided down to good healthy tissues using a versa jet debrider  on a setting of 6. The wound was then partially covered with Mepitel dressing and stapled intact around the plantar aspect. Again significant bleeding was noted so Surgi-Flo was injected in to the fifth metatarsal wound followed by packing with antibiotic beads and then stapling the remainder of the wound with Mepitel to keep the beads intact. The posterior heel ulceration was then addressed and the medial aspect of the eschar was initially started with an incision but again there was noted to be significant bleeding even through the tourniquet. Did not appear to be any significant purulence or cellulitis so decision was made to discontinue debridement of the heel and we will manage this with wound care. Xeroform was applied to the first ray resection amputation site followed by 4 x 4's fluffs ABDs Kerlix. Tourniquet was released and blood flow noted to return to the digits distally. Another Kerlix and an Ace wrap were then applied for compression. Patient tolerated procedure and anesthesia well and was transported to the PACU with vital signs stable and in good condition.

## 2016-01-28 NOTE — Anesthesia Procedure Notes (Signed)
Procedure Name: LMA Insertion Date/Time: 01/28/2016 8:12 AM Performed by: [id number], Claudell Wohler Pre-anesthesia Checklist: Timeout performed, Patient being monitored, Suction available, Emergency Drugs available and Patient identified Patient Re-evaluated:Patient Re-evaluated prior to inductionOxygen Delivery Method: Circle system utilized Preoxygenation: Pre-oxygenation with 100% oxygen Intubation Type: IV induction Ventilation: Mask ventilation without difficulty LMA: LMA inserted LMA Size: 3.5 Tube type: Oral Number of attempts: 1 Placement Confirmation: CO2 detector,  breath sounds checked- equal and bilateral and positive ETCO2 Tube secured with: Tape

## 2016-01-28 NOTE — Anesthesia Postprocedure Evaluation (Signed)
Anesthesia Post Note  Patient: Jason [name]  Procedure(s) Performed: Procedure(s) (LRB): AMPUTATION TOE (Right) IRRIGATION AND DEBRIDEMENT FOOT (Right)  Patient location during evaluation: PACU Anesthesia Type: General Level of consciousness: awake and alert Pain management: pain level controlled Vital Signs Assessment: post-procedure vital signs reviewed and stable Respiratory status: spontaneous breathing, nonlabored ventilation and respiratory function stable Cardiovascular status: blood pressure returned to baseline and stable Postop Assessment: no signs of nausea or vomiting Anesthetic complications: no    Last Vitals:  Vitals:   01/28/16 1031 01/28/16 1050  BP:    Pulse:  96  Resp:    Temp: (P) 36.2 C     Last Pain:  Vitals:   01/28/16 1050  TempSrc:   PainSc: 0-No pain                 [name]

## 2016-01-28 NOTE — Progress Notes (Signed)
[facility] [place] [facility]   PATIENT NAME: Jason SchneidersMyron [name]    MR#:  [id number]  DATE OF BIRTH:  1932/03/26  SUBJECTIVE:   Came in with ongoing infection of the right foot POD #) Did well wife in the room REVIEW OF SYSTEMS:   Review of Systems  Constitutional: Negative for chills, fever and weight loss.  HENT: Negative for ear discharge, ear pain and nosebleeds.   Eyes: Negative for blurred vision, pain and discharge.  Respiratory: Negative for sputum production, shortness of breath, wheezing and stridor.   Cardiovascular: Negative for chest pain, palpitations, orthopnea and PND.  Gastrointestinal: Negative for abdominal pain, diarrhea, nausea and vomiting.  Genitourinary: Negative for frequency and urgency.  Musculoskeletal: Positive for joint pain. Negative for back pain.  Neurological: Positive for weakness. Negative for sensory change, speech change and focal weakness.  Psychiatric/Behavioral: Negative for depression and hallucinations. The patient is not nervous/anxious.    Tolerating Diet:yes Tolerating PT: pending  DRUG ALLERGIES:  No Known Allergies  VITALS:  Blood pressure (!) 144/60, pulse 96, temperature 97.8 F (36.6 C), temperature source Oral, resp. rate 16, height 5\' 8"  (1.727 m), weight 117.8 kg (259 lb 12.8 oz), SpO2 96 %.  PHYSICAL EXAMINATION:   Physical Exam  GENERAL:  82 y.o.-year-old patient lying in the bed with no acute distress.  EYES: Pupils equal, round, reactive to light and accommodation. No scleral icterus. Extraocular muscles intact.  HEENT: Head atraumatic, normocephalic. Oropharynx and nasopharynx clear.  NECK:  Supple, no jugular venous distention. No thyroid enlargement, no tenderness.  LUNGS: Normal breath sounds bilaterally, no wheezing, rales, rhonchi. No use of accessory muscles of respiration.  CARDIOVASCULAR: S1, S2 normal. No murmurs, rubs, or gallops.  ABDOMEN: Soft, nontender, nondistended. Bowel  sounds present. No organomegaly or mass.  EXTREMITIES: No cyanosis, clubbing or edema b/l.   Right chronic nonhealling ulcers/infection of the great toe and 5th digit s/p surgical dressing + NEUROLOGIC: Cranial nerves II through XII are intact. No focal Motor or sensory deficits b/l.   PSYCHIATRIC:  patient is alert and oriented x 3.  SKIN: No obvious rash, lesion, or ulcer.   LABORATORY PANEL:  CBC  Recent Labs Lab 01/26/16 0326  WBC 7.9  HGB 9.9*  HCT 29.2*  PLT 135*    Chemistries   Recent Labs Lab 01/26/16 0326  01/28/16 0322  NA 135  --   --   K 2.4*  < > 3.9  CL 94*  --   --   CO2 34*  --   --   GLUCOSE 181*  --   --   BUN 15  --   --   CREATININE 1.22  --  1.25*  CALCIUM 6.9*  --   --   MG  --   < > 1.7  < > = values in this interval not displayed. Cardiac Enzymes No results for input(s): TROPONINI in the last 168 hours. RADIOLOGY:  No results found. ASSESSMENT AND PLAN:  82 year old male history of insulin requiring type 2 diabetes presenting with foot ulcer.  1. Right foot osteomyelitis: podiatry consult with Dr Alberteen Spindleline noted -s/p Amputation right great toe with partial first ray resection. 2. Excisional debridement ulceration right fifth metatarsal full-thickness to joint capsule by Dr cline -MRI right foot results noted  2. Type 2 diabetes insulin requiring: restart basal insulin with sliding coverage, diabetic consult   3. Hypothyroidism unspecified: synthroid  4. Essential hypertension: vasotec  5. DVT prophylaxis  Lovneox  6. Hypokalemia repleted   CSW for d/c plannig Case discussed with Care Management/Social Worker. Management plans discussed with the patient, family and they are in agreement.  CODE STATUS: FUll DVT Prophylaxis: loveonox TOTAL TIME TAKING CARE OF THIS PATIENT: 30 minutes.  >50% time spent on counselling and coordination of care pt, wife ans son  POSSIBLE D/C IN 1-2 DAYS, DEPENDING ON CLINICAL CONDITION.  Note: This  dictation was prepared with Dragon dictation along with smaller phrase technology. Any transcriptional errors that result from this process are unintentional.  Rakeen Gaillard M.D on 01/28/2016 at 11:36 AM  Between 7am to 6pm - Pager - [phone]  After 6pm go to [email or web address] - password [email or web address] [facility]  [facility] [place] [facility]  Office  [phone]  CC: Primary care physician; Dione Housekeeperlmedo, Mario Ernesto, MD

## 2016-01-28 NOTE — Transfer of Care (Signed)
Immediate Anesthesia Transfer of Care Note  Patient: Jason [name]  Procedure(s) Performed: Procedure(s): AMPUTATION TOE (Right) IRRIGATION AND DEBRIDEMENT FOOT (Right)  Patient Location: PACU  Anesthesia Type:General  Level of Consciousness: awake and alert   Airway & Oxygen Therapy: Patient Spontanous Breathing  Post-op Assessment: Report given to RN and Post -op Vital signs reviewed and stable  Post vital signs: Reviewed and stable  Last Vitals:  Vitals:   01/28/16 0333 01/28/16 0716  BP: (!) 152/65 (!) 166/67  Pulse: 99 96  Resp: 16   Temp: 36.9 C 36.6 C    Last Pain:  Vitals:   01/28/16 0716  TempSrc: Oral  PainSc:          Complications: No apparent anesthesia complications

## 2016-01-28 NOTE — Plan of Care (Signed)
Problem: Fluid Volume: Goal: Ability to maintain a balanced intake and output will improve Outcome: Progressing Pt has remained free of falls/injury this shift. Pt is aware of plan of care and cooperative, debridement of R foot and toe amputation completed today.

## 2016-01-28 NOTE — Consult Note (Signed)
MEDICATION RELATED CONSULT NOTE - INITIAL   Pharmacy Consult for electrolytes Indication: hypokalemia, hypomagnesemia  No Known Allergies  Patient Measurements: Height: 5\' 8"  (172.7 cm) Weight: 259 lb 12.8 oz (117.8 kg) IBW/kg (Calculated) : 68.4  Vital Signs: Temp: 98.4 F (36.9 C) (11/11 0333) Temp Source: Oral (11/11 0333) BP: 152/65 (11/11 0333) Pulse Rate: 99 (11/11 0333) Intake/Output from previous day: 11/10 0701 - 11/11 0700 In: 240 [P.O.:240] Out: -  Intake/Output from this shift: No intake/output data recorded.  Labs:  Recent Labs  01/25/16 1914 01/26/16 0326  01/26/16 1811 01/27/16 0258 01/27/16 1354 01/28/16 0322  WBC  --  7.9  --   --   --   --   --   HGB  --  9.9*  --   --   --   --   --   HCT  --  29.2*  --   --   --   --   --   PLT  --  135*  --   --   --   --   --   CREATININE 1.23 1.22  --   --   --   --  1.25*  MG  --   --   < > 1.2* 2.1 1.8 1.7  PHOS  --   --   --  2.5  --   --  2.5  < > = values in this interval not displayed. Estimated Creatinine Clearance: 57.8 mL/min (by C-G formula based on SCr of 1.25 mg/dL (H)).  Microbiology: Recent Results (from the past 720 hour(s))  MRSA PCR Screening     Status: None   Collection Time: 01/25/16  8:01 PM  Result Value Ref Range Status   MRSA by PCR NEGATIVE NEGATIVE Final    Comment:        The GeneXpert MRSA Assay (FDA approved for NASAL specimens only), is one component of a comprehensive MRSA colonization surveillance program. It is not intended to diagnose MRSA infection nor to guide or monitor treatment for MRSA infections.     Medical History: Past Medical History:  Diagnosis Date  . BPH (benign prostatic hyperplasia)   . Constipation   . COPD (chronic obstructive pulmonary disease) (HCC)   . Diabetes mellitus without complication (HCC)   . GERD (gastroesophageal reflux disease)   . HTN (hypertension)   . Hypothyroidism   . Stroke [facility](HCC)     Assessment: Pt is a 82 year  old male with osteomylitis of the foot here for amuptation of toe. Found to have hypokalemia and hypomagnesemia.  Toe amputation today was postponed due to hypokalemia. Electrolytes replaced this morning with 30 mEq KCl IV x 2 doses.  Afternoon labs: K = 3.7, Mg = 1.8 - both WNL  Goal of Therapy:  Electrolytes within normal limits  Plan:  Will order scheduled supplementation as electrolytes have remained low and patient is received furosemide daily.  Mag-ox 400 mg PO BID as Mg is trending down Will give KCl 20 mEq PO once now.  Ordered KCl 40 mEq PO BID beginning tonight per discussion with MD.  11/11 0322 K 3.9, Mg 1.7, phos 2.5. Continue oral potassium and magnesium as ordered.   Will recheck electrolytes with AM labs tomorrow.  Pharmacy to continue to follow.  Carola FrostNathan A Assunta Pupo, PharmD, BCPS Clinical Pharmacist 01/28/2016,4:40 AM

## 2016-01-28 NOTE — Anesthesia Preprocedure Evaluation (Signed)
Anesthesia Evaluation  Patient identified by MRN, date of birth, ID band Patient awake    Reviewed: Allergy & Precautions, NPO status , Patient's Chart, lab work & pertinent test results  History of Anesthesia Complications Negative for: history of anesthetic complications  Airway Mallampati: II  TM Distance: >3 FB Neck ROM: Full    Dental  (+) Poor Dentition, Edentulous Upper   Pulmonary neg sleep apnea, COPD,  COPD inhaler, former smoker,    breath sounds clear to auscultation- rhonchi (-) wheezing      Cardiovascular hypertension, Pt. on medications (-) CAD, (-) Past MI and (-) Cardiac Stents  Rhythm:Regular Rate:Normal + Systolic murmurs (grade 3 SEM)- Diastolic murmurs Echo 01/03/15: NORMAL LEFT VENTRICULAR SYSTOLIC FUNCTION WITH MILD LVH MILD VALVULAR REGURGITATION (See above) MILD VALVULAR STENOSIS (See above) Morphology: MILDLY THICKENED LVH: MODERATE LVH Closest EF: >55% (Estimated) ZOX:WRUEAVS:MILD AS Mitral: MILD MR Tricuspid: TRIVIAL TR   Neuro/Psych CVA, No Residual Symptoms negative psych ROS   GI/Hepatic Neg liver ROS, GERD  ,  Endo/Other  diabetes, Type 2, Insulin DependentHypothyroidism   Renal/GU CRFRenal diseasenegative Renal ROS     Musculoskeletal negative musculoskeletal ROS (+)   Abdominal (+) + obese,   Peds  Hematology negative hematology ROS (+)   Anesthesia Other Findings Past Medical History: No date: BPH (benign prostatic hyperplasia) No date: Constipation No date: COPD (chronic obstructive pulmonary disease) (* No date: Diabetes mellitus without complication (HCC) No date: GERD (gastroesophageal reflux disease) No date: HTN (hypertension) No date: Hypothyroidism No date: Stroke [facility](HCC)   Reproductive/Obstetrics                             Anesthesia Physical Anesthesia Plan  ASA: III  Anesthesia Plan: General   Post-op Pain Management:    Induction:  Intravenous  Airway Management Planned: LMA  Additional Equipment:   Intra-op Plan:   Post-operative Plan:   Informed Consent: I have reviewed the patients History and Physical, chart, labs and discussed the procedure including the risks, benefits and alternatives for the proposed anesthesia with the patient or authorized representative who has indicated his/her understanding and acceptance.   Dental advisory given  Plan Discussed with: CRNA and Anesthesiologist  Anesthesia Plan Comments:         Anesthesia Quick Evaluation

## 2016-01-28 NOTE — Interval H&P Note (Signed)
History and Physical Interval Note:  01/28/2016 8:00 AM  Jason [name]  has presented today for surgery, with the diagnosis of N/A  The various methods of treatment have been discussed with the patient and family. After consideration of risks, benefits and other options for treatment, the patient has consented to  Procedure(s): AMPUTATION TOE (Right) IRRIGATION AND DEBRIDEMENT FOOT (Right) as a surgical intervention .  The patient's history has been reviewed, patient examined, no change in status, stable for surgery.  I have reviewed the patient's chart and labs.  Questions were answered to the patient's satisfaction.     Ricci Barkerodd W Taurus Willis

## 2016-01-28 NOTE — Progress Notes (Signed)
Shift assessment completed at 0715. Pt left for the OR at 0730 via his bed, returned to the floor at 1100, wife present at that time, Dr. Alberteen Spindleline spoke briefly with the family prior to pt's arrival. Pt was slightly nauseous on arrival, Dr. Allena KatzPatel was paged and this writer received order for zofran, pt received this and percocet at 1230 for c/o pain to his R foot, and nausea. Pt is tolerating small amounts of po fluids at that time. Pt on return noted ot have a bulky gauze dressing and ace wrap intact to his foot, prevalon boot was placed and leg elevatedon pillows per md order. PIV #20 to Rac remains patent. Pt had visitors this afternoon, received percocet for throbbing pain to his R foot at 1630. Dr. Allena KatzPatel also rounded on pt after his return. Call bell in reach.

## 2016-01-28 NOTE — Progress Notes (Signed)
MD paged to notify of pt bp and needing pain medication. Awaiting return call

## 2016-01-29 LAB — GLUCOSE, CAPILLARY
GLUCOSE-CAPILLARY: 342 mg/dL — AB (ref 65–99)
GLUCOSE-CAPILLARY: 271 mg/dL — AB (ref 65–99)
Glucose-Capillary: 219 mg/dL — ABNORMAL HIGH (ref 65–99)
Glucose-Capillary: 305 mg/dL — ABNORMAL HIGH (ref 65–99)

## 2016-01-29 LAB — CBC
HCT: 28.8 % — ABNORMAL LOW (ref 40.0–52.0)
Hemoglobin: 9.4 g/dL — ABNORMAL LOW (ref 13.0–18.0)
MCH: 28.1 pg (ref 26.0–34.0)
MCHC: 32.8 g/dL (ref 32.0–36.0)
MCV: 85.8 fL (ref 80.0–100.0)
PLATELETS: 122 10*3/uL — AB (ref 150–440)
RBC: 3.35 MIL/uL — AB (ref 4.40–5.90)
RDW: 14.1 % (ref 11.5–14.5)
WBC: 8.1 10*3/uL (ref 3.8–10.6)

## 2016-01-29 LAB — BASIC METABOLIC PANEL
Anion gap: 6 (ref 5–15)
BUN: 17 mg/dL (ref 6–20)
CO2: 32 mmol/L (ref 22–32)
Calcium: 7.1 mg/dL — ABNORMAL LOW (ref 8.9–10.3)
Chloride: 96 mmol/L — ABNORMAL LOW (ref 101–111)
Creatinine, Ser: 1.71 mg/dL — ABNORMAL HIGH (ref 0.61–1.24)
GFR calc Af Amer: 41 mL/min — ABNORMAL LOW (ref >60)
GFR, EST NON AFRICAN AMERICAN: 36 mL/min — AB (ref >60)
GLUCOSE: 205 mg/dL — AB (ref 65–99)
POTASSIUM: 4.4 mmol/L (ref 3.5–5.1)
Sodium: 134 mmol/L — ABNORMAL LOW (ref 135–145)

## 2016-01-29 LAB — MAGNESIUM: Magnesium: 1.6 mg/dL — ABNORMAL LOW (ref 1.7–2.4)

## 2016-01-29 LAB — VANCOMYCIN, TROUGH: Vancomycin Tr: 21 ug/mL (ref 15–20)

## 2016-01-29 LAB — PHOSPHORUS: Phosphorus: 2.9 mg/dL (ref 2.5–4.6)

## 2016-01-29 MED ORDER — CIPROFLOXACIN HCL 500 MG PO TABS
500.0000 mg | ORAL_TABLET | Freq: Two times a day (BID) | ORAL | Status: DC
  Administered 2016-01-29 – 2016-01-31 (×6): 500 mg via ORAL
  Filled 2016-01-29 (×7): qty 1

## 2016-01-29 MED ORDER — AMOXICILLIN-POT CLAVULANATE 875-125 MG PO TABS
1.0000 | ORAL_TABLET | Freq: Two times a day (BID) | ORAL | Status: DC
  Administered 2016-01-29 – 2016-01-31 (×6): 1 via ORAL
  Filled 2016-01-29 (×6): qty 1

## 2016-01-29 MED ORDER — MAGNESIUM SULFATE 2 GM/50ML IV SOLN
2.0000 g | Freq: Once | INTRAVENOUS | Status: AC
  Administered 2016-01-29: 2 g via INTRAVENOUS
  Filled 2016-01-29: qty 50

## 2016-01-29 MED ORDER — KETOROLAC TROMETHAMINE 15 MG/ML IJ SOLN
15.0000 mg | Freq: Once | INTRAMUSCULAR | Status: AC
  Administered 2016-01-29: 15 mg via INTRAVENOUS
  Filled 2016-01-29: qty 1

## 2016-01-29 MED ORDER — POTASSIUM CHLORIDE CRYS ER 20 MEQ PO TBCR
20.0000 meq | EXTENDED_RELEASE_TABLET | Freq: Every day | ORAL | Status: DC
  Administered 2016-01-29: 20 meq via ORAL
  Filled 2016-01-29: qty 1

## 2016-01-29 MED ORDER — VANCOMYCIN HCL 10 G IV SOLR
1250.0000 mg | INTRAVENOUS | Status: DC
  Administered 2016-01-29: 1250 mg via INTRAVENOUS
  Filled 2016-01-29: qty 1250

## 2016-01-29 MED ORDER — KETOROLAC TROMETHAMINE 15 MG/ML IJ SOLN: 15.0000 mg | Freq: Once | INTRAMUSCULAR | Status: DC

## 2016-01-29 NOTE — Progress Notes (Signed)
[facility] - [place] [facility]   PATIENT NAME: Jason SchneidersMyron [name]    MR#:  [id number]  DATE OF BIRTH:  [birth year]  SUBJECTIVE:   Came in with ongoing infection of the right foot POD #1 No new complaints REVIEW OF SYSTEMS:   Review of Systems  Constitutional: Negative for chills, fever and weight loss.  HENT: Negative for ear discharge, ear pain and nosebleeds.   Eyes: Negative for blurred vision, pain and discharge.  Respiratory: Negative for sputum production, shortness of breath, wheezing and stridor.   Cardiovascular: Negative for chest pain, palpitations, orthopnea and PND.  Gastrointestinal: Negative for abdominal pain, diarrhea, nausea and vomiting.  Genitourinary: Negative for frequency and urgency.  Musculoskeletal: Positive for joint pain. Negative for back pain.  Neurological: Positive for weakness. Negative for sensory change, speech change and focal weakness.  Psychiatric/Behavioral: Negative for depression and hallucinations. The patient is not nervous/anxious.    Tolerating Diet:yes Tolerating PT: pending  DRUG ALLERGIES:  No Known Allergies  VITALS:  Blood pressure 130/65, pulse (!) 108, temperature 98.4 F (36.9 C), temperature source Oral, resp. rate 18, height 5\' 8"  (1.727 m), weight 117.8 kg (259 lb 12.8 oz), SpO2 95 %.  PHYSICAL EXAMINATION:   Physical Exam  GENERAL:  82 y.o.-year-old patient lying in the bed with no acute distress.  EYES: Pupils equal, round, reactive to light and accommodation. No scleral icterus. Extraocular muscles intact.  HEENT: Head atraumatic, normocephalic. Oropharynx and nasopharynx clear.  NECK:  Supple, no jugular venous distention. No thyroid enlargement, no tenderness.  LUNGS: Normal breath sounds bilaterally, no wheezing, rales, rhonchi. No use of accessory muscles of respiration.  CARDIOVASCULAR: S1, S2 normal. No murmurs, rubs, or gallops.  ABDOMEN: Soft, nontender, nondistended. Bowel sounds  present. No organomegaly or mass.  EXTREMITIES: No cyanosis, clubbing or edema b/l.   Right chronic nonhealling ulcers/infection of the great toe and 5th digit s/p surgical dressing + NEUROLOGIC: Cranial nerves II through XII are intact. No focal Motor or sensory deficits b/l.   PSYCHIATRIC:  patient is alert and oriented x 3.  SKIN: No obvious rash, lesion, or ulcer.   LABORATORY PANEL:  CBC  Recent Labs Lab 01/29/16 0118  WBC 8.1  HGB 9.4*  HCT 28.8*  PLT 122*    Chemistries   Recent Labs Lab 01/29/16 0118  NA 134*  K 4.4  CL 96*  CO2 32  GLUCOSE 205*  BUN 17  CREATININE 1.71*  CALCIUM 7.1*  MG 1.6*   Cardiac Enzymes No results for input(s): TROPONINI in the last 168 hours. RADIOLOGY:  No results found. ASSESSMENT AND PLAN:  82 year old male history of insulin requiring type 2 diabetes presenting with foot ulcer.  1. Right foot osteomyelitis: podiatry consult with Dr Alberteen Spindleline noted -s/p Amputation right great toe with partial first ray resection. 2. Excisional debridement ulceration right fifth metatarsal full-thickness to joint capsule by Dr cline -MRI right foot results noted -change to po augmentin and cipro  2. Type 2 diabetes insulin requiring: restart basal insulin with sliding coverage, diabetic consult   3. Hypothyroidism unspecified: synthroid  4. Essential hypertension: vasotec  5. DVT prophylaxis Lovneox  6. Hypokalemia repleted   CSW for d/c plannig-tomorrow  Case discussed with Care Management/Social Worker. Management plans discussed with the patient, family and they are in agreement.  CODE STATUS: FUll DVT Prophylaxis: loveonox TOTAL TIME TAKING CARE OF THIS PATIENT: 30 minutes.  >50% time spent on counselling and coordination of care pt, wife  ans son  POSSIBLE D/C IN 1-2 DAYS, DEPENDING ON CLINICAL CONDITION.  Note: This dictation was prepared with Dragon dictation along with smaller phrase technology. Any transcriptional errors that  result from this process are unintentional.  Tramane Gorum M.D on 01/29/2016 at 12:18 PM  Between 7am to 6pm - Pager - [phone]  After 6pm go to [email or web address] - password [email or web address]  [facility] [place] [facility]  Office  [phone]  CC: Primary care physician; Dione Housekeeperlmedo, Mario Ernesto, MD

## 2016-01-29 NOTE — Progress Notes (Signed)
1 Day Post-Op  Subjective: Patient seen. Not really complaining of any significant pain in the foot. Some indigestion.  Objective: Vital signs in last 24 hours: Temp:  [97.1 F (36.2 C)-98.4 F (36.9 C)] 98.4 F (36.9 C) (11/12 0724) Pulse Rate:  [86-108] 108 (11/12 0724) Resp:  [16-18] 18 (11/12 0724) BP: (80-144)/(40-66) 92/56 (11/12 0724) SpO2:  [90 %-97 %] 95 % (11/12 0724) Last BM Date: 01/27/16  Intake/Output from previous day: 11/11 0701 - 11/12 0700 In: 540 [P.O.:240; I.V.:100; IV Piggyback:200] Out: 100 [Blood:100] Intake/Output this shift: No intake/output data recorded.  The bandage on the right foot is dry and intact. Upon removal there is some mild bleeding from both of the surgical sites as well as the right heel. No significant purulence. Erythema and edema improving. Incision well coapted at the great toe amputation site.  Lab Results:   Recent Labs  01/29/16 0118  WBC 8.1  HGB 9.4*  HCT 28.8*  PLT 122*   BMET  Recent Labs  01/28/16 0322 01/29/16 0118  NA  --  134*  K 3.9 4.4  CL  --  96*  CO2  --  32  GLUCOSE  --  205*  BUN  --  17  CREATININE 1.25* 1.71*  CALCIUM  --  7.1*   PT/INR No results for input(s): LABPROT, INR in the last 72 hours. ABG No results for input(s): PHART, HCO3 in the last 72 hours.  Invalid input(s): PCO2, PO2  Studies/Results: No results found.  Anti-infectives: Anti-infectives    Start     Dose/Rate Route Frequency Ordered Stop   01/29/16 0800  vancomycin (VANCOCIN) 1,250 mg in sodium chloride 0.9 % 250 mL IVPB     1,250 mg 166.7 mL/hr over 90 Minutes Intravenous Every 24 hours 01/29/16 0203     01/26/16 0600  piperacillin-tazobactam (ZOSYN) IVPB 3.375 g     3.375 g 12.5 mL/hr over 240 Minutes Intravenous Every 8 hours 01/25/16 2009     01/26/16 0200  vancomycin (VANCOCIN) 1,500 mg in sodium chloride 0.9 % 500 mL IVPB  Status:  Discontinued     1,500 mg 250 mL/hr over 120 Minutes Intravenous Every 24 hours  01/25/16 2008 01/29/16 0200   01/25/16 2200  piperacillin-tazobactam (ZOSYN) IVPB 3.375 g  Status:  Discontinued     3.375 g 12.5 mL/hr over 240 Minutes Intravenous Every 8 hours 01/25/16 1751 01/25/16 1753   01/25/16 1830  vancomycin (VANCOCIN) IVPB 1000 mg/200 mL premix     1,000 mg 200 mL/hr over 60 Minutes Intravenous  Once 01/25/16 1752 01/25/16 2105   01/25/16 1830  piperacillin-tazobactam (ZOSYN) IVPB 3.375 g     3.375 g 12.5 mL/hr over 240 Minutes Intravenous  Once 01/25/16 1753 01/26/16 0005   01/25/16 1800  vancomycin (VANCOCIN) IVPB 1000 mg/200 mL premix  Status:  Discontinued     1,000 mg 200 mL/hr over 60 Minutes Intravenous Every 24 hours 01/25/16 1751 01/25/16 1753      Assessment/Plan: s/p Procedure(s): AMPUTATION TOE (Right) IRRIGATION AND DEBRIDEMENT FOOT (Right) Assessment: Stable status post first ray resection and debridement ulceration fifth metatarsal.   Plan: Sterile dressing were placed to the right foot covering the surgical sites and ulcerations. At this point patient may begin limited weightbearing in a surgical shoe walking flat footed, mostly just for transfers or bathroom privileges.. Otherwise he will remain in the pressure relief boot to keep pressure off the heel. Recommend switching to augmentation and Cipro, both twice a day for 10  day course and we can readjust antibiotic coverage pending culture results. I believe patient should be stable for discharge by tomorrow. Would most likely recommend skilled nursing for a week or 2 for dressing changes and wound assessment. I will see him outpatient in the office in the next week to week and a half.  LOS: 4 days    Ricci Barkerodd W Marjani Kobel 01/29/2016

## 2016-01-29 NOTE — Evaluation (Signed)
Physical Therapy Evaluation Patient Details Name: Jason [name] Kana MRN: [id number] DOB: 08/14/1932 Today's Date: 01/29/2016   History of Present Illness  Pt is a 82 year old male with osteomylitis of the foot here for amuptation of toe. Patient is s/p right great toe amputation with debridement on 01/28/16; He was at short term rehab facility prior to admittance where he was getting care for last few weeks.   Clinical Impression  82 yo Male came to SNF with osteomyelitis of right foot. He is s/p right great toe amputation on 01/28/16. Patient reports needing assistance with ADLs prior to admittance. He was living at home before he went to SNF rehab and his wife was able to assist him with bathing and dressing. Patient reports sleeping in recliner at home. Patient currently is min A for bed mobility; He is mod A for sit<>Stand transfers with RW. Patient able to take a few steps to bedside chair. He had difficulty with weight shift and with foot clearance. He demonstrates weakness in BLE. Patient would benefit from additional skilled PT intervention to improve strength, balance and gait safety.     Follow Up Recommendations Other (comment);Supervision for mobility/OOB (to be determined; would benefit from additional rehab at SNF due to low level physical functioning, however pt wants to go home; )    Equipment Recommendations  None recommended by PT    Recommendations for Other Services Rehab consult     Precautions / Restrictions Precautions Precautions: Fall Required Braces or Orthoses: Other Brace/Splint (post op shoe on RLE when out of bed weight bearing) Restrictions Weight Bearing Restrictions: Yes RLE Weight Bearing: Weight bearing as tolerated (with post op shoe)      Mobility  Bed Mobility Overal bed mobility: Needs Assistance Bed Mobility: Supine to Sit     Supine to sit: Min assist     General bed mobility comments: utilized elevated head of bed and bed rails for supine  to sitting;   Transfers Overall transfer level: Needs assistance Equipment used: Rolling walker (2 wheeled) Transfers: Sit to/from Stand Sit to Stand: Mod assist         General transfer comment: patient mod A for sit<> stand with RW, x2 attempts with cues for forward weight shift and to push through LLE for better tolerance;   Ambulation/Gait Ambulation/Gait assistance: Mod assist Ambulation Distance (Feet): 2 Feet Assistive device: Rolling walker (2 wheeled) Gait Pattern/deviations: Step-to pattern;Decreased step length - right;Decreased step length - left;Decreased stance time - right;Decreased dorsiflexion - right;Decreased dorsiflexion - left;Trunk flexed;Wide base of support Gait velocity: decreased   General Gait Details: patient took a few steps to bedside chair; he required mod A for trunk control and to improve weight shift with steps; Demonstrates decreased tolerance with RLE weight bearing due to pain;   Stairs            Wheelchair Mobility    Modified Rankin (Stroke Patients Only)       Balance Overall balance assessment: Needs assistance Sitting-balance support: Single extremity supported Sitting balance-Leahy Scale: Good     Standing balance support: Bilateral upper extremity supported Standing balance-Leahy Scale: Poor Standing balance comment: requires assistance for trunk control and weight shift;                              Pertinent Vitals/Pain Pain Assessment: 0-10 Pain Score: 6  Pain Location: right foot Pain Descriptors / Indicators: Sore Pain Intervention(s): Limited activity  within patient's tolerance;Monitored during session;Repositioned    Home Living Family/patient expects to be discharged to:: Unsure (wants to go home; however from SNF)                      Prior Function Level of Independence: Needs assistance   Gait / Transfers Assistance Needed: used RW; was limited due to right foot pain  ADL's /  Homemaking Assistance Needed: needs assistance with bathing and dressing; would sleep in recliner when at home.  Comments: Patient lives with wife and son in a mobile home; steps to enter with B rails; however son just had ramp built with large portch area. has RW and manual wheelchair at home; Uses walk in shower with shower seat for bathing;      Hand Dominance   Dominant Hand: Right    Extremity/Trunk Assessment   Upper Extremity Assessment: Overall WFL for tasks assessed           Lower Extremity Assessment: RLE deficits/detail;LLE deficits/detail RLE Deficits / Details: hip/knee grossly 4-/5; ankle not tested; ROM is WFL; impaired light touch sensation in foot; intact in lower leg;  LLE Deficits / Details: grossly 4-/5; impaired sensation in foot;   Cervical / Trunk Assessment: Kyphotic  Communication   Communication: No difficulties  Cognition Arousal/Alertness: Awake/alert Behavior During Therapy: WFL for tasks assessed/performed Overall Cognitive Status: Within Functional Limits for tasks assessed                      General Comments      Exercises Other Exercises Other Exercises: reinforced HEP including ankle pumps, quad sets and gluteal squeezes; Patient reports that he does these exercises regularly;   Assessment/Plan    PT Assessment Patient needs continued PT services  PT Problem List Decreased strength;Decreased mobility;Decreased safety awareness;Decreased activity tolerance;Decreased balance;Impaired sensation          PT Treatment Interventions DME instruction;Gait training;Stair training;Functional mobility training;Neuromuscular re-education;Balance training;Therapeutic exercise;Therapeutic activities;Patient/family education    PT Goals (Current goals can be found in the Care Plan section)  Acute Rehab PT Goals Patient Stated Goal: "I want to go home." PT Goal Formulation: With patient Time For Goal Achievement: 02/12/16 Potential to  Achieve Goals: Fair    Frequency 7X/week   Barriers to discharge Inaccessible home environment;Decreased caregiver support has ramp and/or stairs to get into home; wife is there to assist with ADLs but unsure of her capabilities.     Co-evaluation               End of Session Equipment Utilized During Treatment: Gait belt Activity Tolerance: Patient limited by pain Patient left: in chair;with call bell/phone within reach;with chair alarm set           Time: 1115-1145 PT Time Calculation (min) (ACUTE ONLY): 30 min   Charges:   PT Evaluation $PT Eval Moderate Complexity: 1 Procedure     PT G Codes:        Tom Macpherson PT, DPT 01/29/2016, 12:03 PM

## 2016-01-29 NOTE — Consult Note (Signed)
MEDICATION RELATED CONSULT NOTE - INITIAL   Pharmacy Consult for electrolytes Indication: hypokalemia, hypomagnesemia  No Known Allergies  Patient Measurements: Height: 5\' 8"  (172.7 cm) Weight: 259 lb 12.8 oz (117.8 kg) IBW/kg (Calculated) : 68.4  Vital Signs: Temp: 98.4 F (36.9 C) (11/12 0724) Temp Source: Oral (11/12 0724) BP: 92/56 (11/12 0724) Pulse Rate: 108 (11/12 0724) Intake/Output from previous day: 11/11 0701 - 11/12 0700 In: 540 [P.O.:240; I.V.:100; IV Piggyback:200] Out: 100 [Blood:100] Intake/Output from this shift: No intake/output data recorded.  Labs:  Recent Labs  01/26/16 1811  01/27/16 1354 01/28/16 0322 01/29/16 0118  WBC  --   --   --   --  8.1  HGB  --   --   --   --  9.4*  HCT  --   --   --   --  28.8*  PLT  --   --   --   --  122*  CREATININE  --   --   --  1.25* 1.71*  MG 1.2*  < > 1.8 1.7 1.6*  PHOS 2.5  --   --  2.5 2.9  < > = values in this interval not displayed. Estimated Creatinine Clearance: 42.3 mL/min (by C-G formula based on SCr of 1.71 mg/dL (H)).   Assessment: Pt is a 82 year old male with osteomylitis of the foot here for amuptation of toe. Found to have hypokalemia and hypomagnesemia.   Goal of Therapy:  Electrolytes within normal limits  Plan:  Magnesium 1.6, given 2gm IV magnesium.   Potassium 4.4, renal function worsening. Will d/c orders for daily potassium and continue to monitor.   Martyn MalayBarefoot,Lexy Meininger C, PharmD, Clinical Pharmacist 01/29/2016,9:58 AM

## 2016-01-29 NOTE — Progress Notes (Addendum)
MD paged to notify of bp and pt requesting something for pain

## 2016-01-29 NOTE — Care Management Important Message (Signed)
Important Message  Patient Details  Name: Jason [name] MRN: [id number] Date of Birth: 1932-10-05   Medicare Important Message Given:  Yes    Manar Smalling A, RN 01/29/2016, 2:50 PM

## 2016-01-29 NOTE — Consult Note (Signed)
MEDICATION RELATED CONSULT NOTE - INITIAL   Pharmacy Consult for electrolytes Indication: hypokalemia, hypomagnesemia  No Known Allergies  Patient Measurements: Height: 5\' 8"  (172.7 cm) Weight: 259 lb 12.8 oz (117.8 kg) IBW/kg (Calculated) : 68.4  Vital Signs: Temp: 98 F (36.7 C) (11/11 2329) Temp Source: Oral (11/11 2329) BP: 92/42 (11/12 0052) Pulse Rate: 88 (11/11 2329) Intake/Output from previous day: 11/11 0701 - 11/12 0700 In: 340 [P.O.:240; I.V.:100] Out: 100 [Blood:100] Intake/Output from this shift: No intake/output data recorded.  Labs:  Recent Labs  01/26/16 1811  01/27/16 1354 01/28/16 0322 01/29/16 0118  WBC  --   --   --   --  8.1  HGB  --   --   --   --  9.4*  HCT  --   --   --   --  28.8*  PLT  --   --   --   --  122*  CREATININE  --   --   --  1.25* 1.71*  MG 1.2*  < > 1.8 1.7 1.6*  PHOS 2.5  --   --  2.5 2.9  < > = values in this interval not displayed. Estimated Creatinine Clearance: 42.3 mL/min (by C-G formula based on SCr of 1.71 mg/dL (H)).  Microbiology: Recent Results (from the past 720 hour(s))  MRSA PCR Screening     Status: None   Collection Time: 01/25/16  8:01 PM  Result Value Ref Range Status   MRSA by PCR NEGATIVE NEGATIVE Final    Comment:        The GeneXpert MRSA Assay (FDA approved for NASAL specimens only), is one component of a comprehensive MRSA colonization surveillance program. It is not intended to diagnose MRSA infection nor to guide or monitor treatment for MRSA infections.   Aerobic/Anaerobic Culture (surgical/deep wound)     Status: None (Preliminary result)   Collection Time: 01/28/16  8:25 AM  Result Value Ref Range Status   Specimen Description WOUND  Final   Special Requests NONE  Final   Gram Stain   Final    ABUNDANT WBC PRESENT, PREDOMINANTLY PMN NO ORGANISMS SEEN [facility]    Culture PENDING  Incomplete   Report Status PENDING  Incomplete    Medical History: Past  Medical History:  Diagnosis Date  . BPH (benign prostatic hyperplasia)   . Constipation   . COPD (chronic obstructive pulmonary disease) (HCC)   . Diabetes mellitus without complication (HCC)   . GERD (gastroesophageal reflux disease)   . HTN (hypertension)   . Hypothyroidism   . Stroke [facility](HCC)     Assessment: Pt is a 82 year old male with osteomylitis of the foot here for amuptation of toe. Found to have hypokalemia and hypomagnesemia.  Toe amputation today was postponed due to hypokalemia. Electrolytes replaced this morning with 30 mEq KCl IV x 2 doses.  Afternoon labs: K = 3.7, Mg = 1.8 - both WNL  Goal of Therapy:  Electrolytes within normal limits  Plan:  Will order scheduled supplementation as electrolytes have remained low and patient is received furosemide daily.  Mag-ox 400 mg PO BID as Mg is trending down Will give KCl 20 mEq PO once now.  Ordered KCl 40 mEq PO BID beginning tonight per discussion with MD.  11/11 0322 K 3.9, Mg 1.7, phos 2.5. Continue oral potassium and magnesium as ordered.   11/12 0118 K 4.4, Mg 1.6, phos 2.9. Decrease oral potassium to 20 mEq po  daily, continue oral magnesium, and give magnesium sulfate 2 gm IV x 1.  Will recheck electrolytes with AM labs tomorrow.  Pharmacy to continue to follow.  Carola FrostNathan A L[place], PharmD, BCPS Clinical Pharmacist 01/29/2016,5:10 AM

## 2016-01-29 NOTE — Progress Notes (Signed)
Pharmacy Antibiotic Note  Jason [name] is a 82 y.o. male admitted on 01/25/2016 with osteomyelitis.  Pharmacy has been consulted for vancomycin and piperacillin/tazobactam dosing.  Plan: 11/12 0130 VT 21 mcg/mL. Recalculated Ke 0.032 hr-1. Decrease to vancomycin 1.25 gm IV Q24H starting at 0800. Pharmacy will continue to follow and adjust as needed to maintain trough 15 to 20 mcg/mL.  Height: 5\' 8"  (172.7 cm) Weight: 259 lb 12.8 oz (117.8 kg) IBW/kg (Calculated) : 68.4  Temp (24hrs), Avg:97.8 F (36.6 C), Min:97 F (36.1 C), Max:98.4 F (36.9 C)   Recent Labs Lab 01/25/16 1914 01/26/16 0326 01/28/16 0322 01/29/16 0118  WBC  --  7.9  --  8.1  CREATININE 1.23 1.22 1.25* 1.71*  VANCOTROUGH  --   --   --  21*    Estimated Creatinine Clearance: 42.3 mL/min (by C-G formula based on SCr of 1.71 mg/dL (H)).    No Known Allergies  Antimicrobials this admission: vancomycin 11/8 >>  Piperacillin/tazobactam 11/8 >>   Dose adjustments this admission:  Thank you for allowing pharmacy to be a part of this patient's care.  Jason FrostNathan A Saretta [name], PharmD, BCPS Clinical Pharmacist 01/29/2016 2:02 AM

## 2016-01-30 ENCOUNTER — Encounter: Payer: Self-pay | Admitting: Podiatry

## 2016-01-30 ENCOUNTER — Inpatient Hospital Stay: Payer: Medicare Other

## 2016-01-30 ENCOUNTER — Inpatient Hospital Stay
Admission: AD | Admit: 2016-01-30 | Discharge: 2016-01-30 | Disposition: A | Discharge status: Home / Self Care | Payer: Medicare Other | Source: Ambulatory Visit | Attending: Internal Medicine | Admitting: Internal Medicine

## 2016-01-30 DIAGNOSIS — R7989 Other specified abnormal findings of blood chemistry: Secondary | ICD-10-CM

## 2016-01-30 DIAGNOSIS — R778 Other specified abnormalities of plasma proteins: Secondary | ICD-10-CM

## 2016-01-30 LAB — MAGNESIUM: Magnesium: 1.9 mg/dL (ref 1.7–2.4)

## 2016-01-30 LAB — BASIC METABOLIC PANEL
Anion gap: 8 (ref 5–15)
BUN: 19 mg/dL (ref 6–20)
CHLORIDE: 95 mmol/L — AB (ref 101–111)
CO2: 28 mmol/L (ref 22–32)
CREATININE: 1.63 mg/dL — AB (ref 0.61–1.24)
Calcium: 7.3 mg/dL — ABNORMAL LOW (ref 8.9–10.3)
GFR calc Af Amer: 44 mL/min — ABNORMAL LOW (ref >60)
GFR calc non Af Amer: 38 mL/min — ABNORMAL LOW (ref >60)
Glucose, Bld: 325 mg/dL — ABNORMAL HIGH (ref 65–99)
Potassium: 4.3 mmol/L (ref 3.5–5.1)
SODIUM: 131 mmol/L — AB (ref 135–145)

## 2016-01-30 LAB — BRAIN NATRIURETIC PEPTIDE: B Natriuretic Peptide: 496 pg/mL — ABNORMAL HIGH (ref 0.0–100.0)

## 2016-01-30 LAB — GLUCOSE, CAPILLARY
GLUCOSE-CAPILLARY: 324 mg/dL — AB (ref 65–99)
GLUCOSE-CAPILLARY: 317 mg/dL — AB (ref 65–99)
Glucose-Capillary: 297 mg/dL — ABNORMAL HIGH (ref 65–99)
Glucose-Capillary: 335 mg/dL — ABNORMAL HIGH (ref 65–99)
Glucose-Capillary: 331 mg/dL — ABNORMAL HIGH (ref 65–99)

## 2016-01-30 LAB — ECHOCARDIOGRAM COMPLETE
HEIGHTINCHES: 68 in
WEIGHTICAEL: 4156.8 [oz_av]

## 2016-01-30 LAB — TROPONIN I
TROPONIN I: 0.82 ng/mL — AB (ref <0.03)
Troponin I: 0.88 ng/mL (ref <0.03)
Troponin I: 1.02 ng/mL (ref <0.03)

## 2016-01-30 LAB — PHOSPHORUS: Phosphorus: 2.7 mg/dL (ref 2.5–4.6)

## 2016-01-30 MED ORDER — PERFLUTREN LIPID MICROSPHERE
1.0000 mL | INTRAVENOUS | Status: AC | PRN
  Administered 2016-01-30: 2 mL via INTRAVENOUS
  Filled 2016-01-30: qty 10

## 2016-01-30 MED ORDER — FAMOTIDINE 20 MG PO TABS
20.0000 mg | ORAL_TABLET | Freq: Two times a day (BID) | ORAL | Status: DC
  Administered 2016-01-30 – 2016-01-31 (×3): 20 mg via ORAL
  Filled 2016-01-30 (×4): qty 1

## 2016-01-30 MED ORDER — ALUM & MAG HYDROXIDE-SIMETH 200-200-20 MG/5ML PO SUSP
15.0000 mL | Freq: Three times a day (TID) | ORAL | Status: DC | PRN
  Administered 2016-01-30: 15 mL via ORAL
  Filled 2016-01-30: qty 30

## 2016-01-30 MED ORDER — INSULIN GLARGINE 100 UNIT/ML ~~LOC~~ SOLN
30.0000 [IU] | Freq: Every day | SUBCUTANEOUS | Status: DC
  Administered 2016-01-30: 30 [IU] via SUBCUTANEOUS
  Filled 2016-01-30 (×2): qty 0.3

## 2016-01-30 MED ORDER — FUROSEMIDE 10 MG/ML IJ SOLN
40.0000 mg | Freq: Once | INTRAMUSCULAR | Status: AC
  Administered 2016-01-30: 40 mg via INTRAVENOUS
  Filled 2016-01-30: qty 4

## 2016-01-30 MED ORDER — MORPHINE SULFATE (PF) 2 MG/ML IV SOLN
1.0000 mg | INTRAVENOUS | Status: DC | PRN
  Administered 2016-01-30 (×2): 1 mg via INTRAVENOUS
  Filled 2016-01-30 (×2): qty 1

## 2016-01-30 MED ORDER — TECHNETIUM TC 99M DIETHYLENETRIAME-PENTAACETIC ACID
32.7600 | Freq: Once | INTRAVENOUS | Status: AC | PRN
  Administered 2016-01-30: 32.76 via INTRAVENOUS

## 2016-01-30 MED ORDER — HEPARIN SODIUM (PORCINE) 5000 UNIT/ML IJ SOLN: 5000.0000 [IU] | Freq: Three times a day (TID) | INTRAMUSCULAR | Status: DC

## 2016-01-30 MED ORDER — TECHNETIUM TO 99M ALBUMIN AGGREGATED
3.9870 | Freq: Once | INTRAVENOUS | Status: AC | PRN
  Administered 2016-01-30: 3.987 via INTRAVENOUS

## 2016-01-30 MED ORDER — HEPARIN SODIUM (PORCINE) 5000 UNIT/ML IJ SOLN
5000.0000 [IU] | Freq: Three times a day (TID) | INTRAMUSCULAR | Status: DC
  Administered 2016-01-30 – 2016-01-31 (×4): 5000 [IU] via SUBCUTANEOUS
  Filled 2016-01-30 (×4): qty 1

## 2016-01-30 MED ORDER — NITROGLYCERIN 2 % TD OINT
0.5000 [in_us] | TOPICAL_OINTMENT | Freq: Four times a day (QID) | TRANSDERMAL | Status: DC
Start: 2016-01-30 — End: 2016-01-31
  Administered 2016-01-30 – 2016-01-31 (×4): 0.5 [in_us] via TOPICAL
  Filled 2016-01-30 (×4): qty 1

## 2016-01-30 NOTE — Progress Notes (Signed)
Pt sitting up and talkative. Pt spoke of loss of big toe and expected rehab in nursing home . Pt expressed several times he just wants to go home. 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 CH is available.   01/30/16 1040  Clinical Encounter Type  Visited With Patient  Visit Type Initial  Referral From Nurse  Spiritual Encounters  Spiritual Needs Emotional  Stress Factors  Patient Stress Factors Health changes;Loss

## 2016-01-30 NOTE — Progress Notes (Signed)
Notified Dr. Elpidio AnisSudini that patient is having increased chest pain. VSS. Per MD place order for PRN maalox 15ml q 8 hours PRN. If this does not help give patient a dose of his PRN morphine.

## 2016-01-30 NOTE — Consult Note (Signed)
MEDICATION RELATED CONSULT NOTE - Follow up  Pharmacy Consult for electrolytes Indication: hypokalemia, hypomagnesemia  No Known Allergies  Patient Measurements: Height: 5\' 8"  (172.7 cm) Weight: 259 lb 12.8 oz (117.8 kg) IBW/kg (Calculated) : 68.4  Vital Signs: Temp: 98.5 F (36.9 C) (11/13 0742) Temp Source: Oral (11/13 0742) BP: 119/58 (11/13 0742) Pulse Rate: 108 (11/13 0742) Intake/Output from previous day: 11/12 0701 - 11/13 0700 In: 1137 [P.O.:837; IV Piggyback:300] Out: -  Intake/Output from this shift: No intake/output data recorded.  Labs:  Recent Labs  01/28/16 0322 01/29/16 0118 01/30/16 0448  WBC  --  8.1  --   HGB  --  9.4*  --   HCT  --  28.8*  --   PLT  --  122*  --   CREATININE 1.25* 1.71* 1.63*  MG 1.7 1.6* 1.9  PHOS 2.5 2.9 2.7   Estimated Creatinine Clearance: 44.3 mL/min (by C-G formula based on SCr of 1.63 mg/dL (H)).   Assessment: Pt is a 82 year old male with osteomyelitis of the foot here for amuptation of toe. Found to have hypokalemia and hypomagnesemia.  11/13: K 4.3, Mag 1.9, Phos 2.7  Goal of Therapy:  Electrolytes within normal limits  Plan:  Electrolyte WNL. No additional supplementation needed today.  Will follow up on labs in AM.    Marty HeckWang, Shefali Ng L, PharmD, BCPS Clinical Pharmacist 01/30/2016,8:04 AM

## 2016-01-30 NOTE — Progress Notes (Signed)
[facility] - [place] [facility]   PATIENT NAME: Jason SchneidersMyron [name]    MR#:  [id number]  DATE OF BIRTH:  Oct 19, 1932  SUBJECTIVE:   Came in with ongoing infection of the right foot POD #2  Complaint of chest pain overnight. Now resolved. REVIEW OF SYSTEMS:   Review of Systems  Constitutional: Negative for chills, fever and weight loss.  HENT: Negative for ear discharge, ear pain and nosebleeds.   Eyes: Negative for blurred vision, pain and discharge.  Respiratory: Negative for sputum production, shortness of breath, wheezing and stridor.   Cardiovascular: Negative for chest pain, palpitations, orthopnea and PND.  Gastrointestinal: Negative for abdominal pain, diarrhea, nausea and vomiting.  Genitourinary: Negative for frequency and urgency.  Musculoskeletal: Positive for joint pain. Negative for back pain.  Neurological: Positive for weakness. Negative for sensory change, speech change and focal weakness.  Psychiatric/Behavioral: Negative for depression and hallucinations. The patient is not nervous/anxious.    Tolerating Diet:yes Tolerating PT: pending  DRUG ALLERGIES:  No Known Allergies  VITALS:  Blood pressure (!) 119/58, pulse (!) 108, temperature 98.5 F (36.9 C), temperature source Oral, resp. rate 18, height 5\' 8"  (1.727 m), weight 117.8 kg (259 lb 12.8 oz), SpO2 97 %.  PHYSICAL EXAMINATION:   Physical Exam  GENERAL:  82 y.o.-year-old patient lying in the bed with no acute distress.  EYES: Pupils equal, round, reactive to light and accommodation. No scleral icterus. Extraocular muscles intact.  HEENT: Head atraumatic, normocephalic. Oropharynx and nasopharynx clear.  NECK:  Supple, no jugular venous distention. No thyroid enlargement, no tenderness.  LUNGS: Normal breath sounds bilaterally, no wheezing, rales, rhonchi. No use of accessory muscles of respiration.  CARDIOVASCULAR: S1, S2 normal. No murmurs, rubs, or gallops.  ABDOMEN: Soft,  nontender, nondistended. Bowel sounds present. No organomegaly or mass.  EXTREMITIES: No cyanosis, clubbing or edema b/l.   Right chronic nonhealling ulcers/infection of the great toe and 5th digit s/p surgical dressing + NEUROLOGIC: Cranial nerves II through XII are intact. No focal Motor or sensory deficits b/l.   PSYCHIATRIC:  patient is alert and oriented x 3.  SKIN: No obvious rash, lesion, or ulcer.   LABORATORY PANEL:  CBC  Recent Labs Lab 01/29/16 0118  WBC 8.1  HGB 9.4*  HCT 28.8*  PLT 122*    Chemistries   Recent Labs Lab 01/30/16 0448  NA 131*  K 4.3  CL 95*  CO2 28  GLUCOSE 325*  BUN 19  CREATININE 1.63*  CALCIUM 7.3*  MG 1.9   Cardiac Enzymes  Recent Labs Lab 01/30/16 0854  TROPONINI 0.88*   RADIOLOGY:  Dg Chest 2 View  Result Date: 01/30/2016 CLINICAL DATA:  Hypoxia.  COPD.  Right foot osteomyelitis. EXAM: CHEST  2 VIEW COMPARISON:  None. FINDINGS: Mild cardiomegaly. Aortic atherosclerosis. Diffuse interstitial infiltrates are seen, consistent with diffuse interstitial edema. Small left pleural effusion and left lower lobe atelectasis also demonstrated. IMPRESSION: Mild cardiomegaly and diffuse interstitial infiltrates, consistent with pulmonary edema. Small left pleural effusion left lower lobe atelectasis. Aortic atherosclerosis. Electronically Signed   By: Myles RosenthalJohn  Stahl M.D.   On: 01/30/2016 08:13   ASSESSMENT AND PLAN:  82 year old male history of insulin requiring type 2 diabetes presenting with foot ulcer.  # Acute chf with acute hypoxic resp failure  IV lasix x 1 I/Os Check echo Elevated troponin could be demand ischemia vs NSTEMI NO acute changes on EKG ASA. Consult cardiology Tele  # Right foot osteomyelitis: podiatry consult  with Dr Alberteen Spindleline noted -s/p Amputation right great toe with partial first ray resection. Excisional debridement ulceration right fifth metatarsal full-thickness to joint capsule by Dr cline -MRI right foot results  noted -changed to po augmentin and cipro -Cx pending  # Type 2 diabetes insulin requiring: restart basal insulin with sliding coverage, diabetic consult   # Hypothyroidism unspecified: synthroid  # Essential hypertension: vasotec  # DVT prophylaxis Lovneox  # Hypokalemia repleted   Case discussed with Care Management/Social Worker. Management plans discussed with the patient, family and they are in agreement.  CODE STATUS: FULL  DVT Prophylaxis: lovenox  TOTAL CC TIME TAKING CARE OF THIS PATIENT: 35 minutes.   POSSIBLE D/C IN 1-2 DAYS, DEPENDING ON CLINICAL CONDITION.  Milagros LollSudini, Jason [name] M.D on 01/30/2016 at 11:22 AM  Between 7am to 6pm - Pager - [phone]  After 6pm go to [email or web address] - password [email or web address] [facility]  [facility]  Office  [phone]  CC: Primary care physician; Jason [name], Jason Ernesto, MD   Note: This dictation was prepared with Dragon dictation along with smaller phrase technology. Any transcriptional errors that result from this process are unintentional.

## 2016-01-30 NOTE — Progress Notes (Signed)
MD returned page. New orders to be placed per MD. EKG now, nitro SL. Will cont to monitor

## 2016-01-30 NOTE — Progress Notes (Signed)
Notified Dr. Elpidio AnisSudini of pts cxr results. Orders recd. Will cont to monitor for cp. Awaiting VQ scan.

## 2016-01-30 NOTE — Progress Notes (Signed)
Inpatient Diabetes Program Recommendations  AACE/ADA: New Consensus Statement on Inpatient Glycemic Control (2015)  Target Ranges:  Prepandial:   less than 140 mg/dL      Peak postprandial:   less than 180 mg/dL (1-2 hours)      Critically ill patients:  140 - 180 mg/dL   Results for Jason [name], Jason [name] (MRN [id number]) as of 01/30/2016 08:41  Ref. Range 01/29/2016 07:25 01/29/2016 12:25 01/29/2016 16:29 01/29/2016 22:05  Glucose-Capillary Latest Ref Range: 65 - 99 mg/dL 409219 (H) 811305 (H) 914342 (H) 271 (H)   Results for Jason [name], Jason [name] (MRN [id number]) as of 01/30/2016 08:41  Ref. Range 01/30/2016 07:44  Glucose-Capillary Latest Ref Range: 65 - 99 mg/dL 086324 (H)    Home DM Meds: Lantus 90 units QPM       Humalog 20 units TIDWC       Humalog SSI  Current Insulin Orders: Lantus 20 units QPM        Novolog Moderate Correction Scale/ SSI (0-15 units) TID AC + HS       MD- Please consider the following in-hospital insulin adjustments:  1. Increase Lantus to 45 units QPM (50% home dose)  2. Start Novolog Meal Coverage: Novolog 10 units TIDWC (hold if pt eats <50% of meal)  This would be about 50% of total home dose of rapid-acting insulin      --Will follow patient during hospitalization--  Ambrose FinlandJeannine Johnston Tywana Robotham RN, MSN, CDE Diabetes Coordinator Inpatient Glycemic Control Team Team Pager: [phone] (8a-5p)

## 2016-01-30 NOTE — Progress Notes (Signed)
Per MD patient is not stable for D/C today and will likely be ready tomorrow. Clinical Social Worker (CSW) met with patient and made him aware of above. Patient reported that he would love to go home however he knows he has to go back to [facility] to continue rehab. CSW provided emotional support. Patient is agreeable to return to [facility]. CSW contacted patient's son Jenny Reichmann and left him a voicemail making him aware of above. CSW attempted to contact patient's wife however she did not answer and the voicemail was full so a message could not be left. [place] admissions coordinator at [facility] is aware of above. CSW will continue to follow and assist as needed.   [employer], LCSW [phone]

## 2016-01-30 NOTE — Progress Notes (Signed)
MD paged to notify of pt having chest pain 5/10 described as tightness that came on suddenly and is constant. Vs stable at this time see flowsheet. Awaiting return call

## 2016-01-30 NOTE — Progress Notes (Signed)
Notified Dr. Nemiah CommanderKalisetti that pt had a blood sugar of 317 and is scheduled 11 units of sliding scale insulin and 30 of Lantus per MD give lantus and hold fast acting insulin. Notified MD of elevated troponin. No new orders received will continue to monitor.

## 2016-01-30 NOTE — Progress Notes (Signed)
Notified md re; pts troponin level 0.88. Will cont to monitor. Pt.

## 2016-01-30 NOTE — Progress Notes (Signed)
Dr. Diamond in to see pt.  

## 2016-01-30 NOTE — Progress Notes (Signed)
PT Cancellation Note  Patient Details Name: Jason [name] MRN: [id number] DOB: 1932-08-13   Cancelled Treatment:    Reason Eval/Treat Not Completed: Patient declined, no reason specified   Pt in bed resting.  He refused mobility and/or getting up into chair for lunch.  He stated he has been nauseous all day and getting up makes it worse.  Encouraged supine exercises but he continued to decline.  Will continue as appropriate.   Danielle DessSarah Indy Kuck 01/30/2016, 12:34 PM

## 2016-01-30 NOTE — Care Management (Signed)
POD # 2 s/p right great toe amputation secondary to osteomyelitis of right foot. Plan is return to [facility]. It is anticipated that patient will be discharged today.

## 2016-01-30 NOTE — Progress Notes (Signed)
MD notified of pt c/o chest pain again. VS taken O2 sat at this time 88% on RA placed on 2L o2 [place] now up to 96%. Dr. Lindell Noeaimond to put in orders. Will cont to monitor

## 2016-01-31 LAB — BASIC METABOLIC PANEL
Anion gap: 9 (ref 5–15)
BUN: 24 mg/dL — AB (ref 6–20)
CHLORIDE: 95 mmol/L — AB (ref 101–111)
CO2: 28 mmol/L (ref 22–32)
CREATININE: 1.72 mg/dL — AB (ref 0.61–1.24)
Calcium: 7.6 mg/dL — ABNORMAL LOW (ref 8.9–10.3)
GFR calc Af Amer: 41 mL/min — ABNORMAL LOW (ref >60)
GFR, EST NON AFRICAN AMERICAN: 36 mL/min — AB (ref >60)
GLUCOSE: 325 mg/dL — AB (ref 65–99)
POTASSIUM: 4.6 mmol/L (ref 3.5–5.1)
Sodium: 132 mmol/L — ABNORMAL LOW (ref 135–145)

## 2016-01-31 LAB — GLUCOSE, CAPILLARY
GLUCOSE-CAPILLARY: 253 mg/dL — AB (ref 65–99)
GLUCOSE-CAPILLARY: 230 mg/dL — AB (ref 65–99)
Glucose-Capillary: 352 mg/dL — ABNORMAL HIGH (ref 65–99)
Glucose-Capillary: 276 mg/dL — ABNORMAL HIGH (ref 65–99)

## 2016-01-31 LAB — SURGICAL PATHOLOGY

## 2016-01-31 LAB — MAGNESIUM: Magnesium: 2 mg/dL (ref 1.7–2.4)

## 2016-01-31 MED ORDER — FAMOTIDINE 20 MG PO TABS
20.0000 mg | ORAL_TABLET | Freq: Every day | ORAL | Status: DC
Start: 2016-02-01 — End: 2016-02-14
  Administered 2016-02-02 – 2016-02-14 (×12): 20 mg via ORAL
  Filled 2016-01-31 (×13): qty 1

## 2016-01-31 MED ORDER — INSULIN ASPART 100 UNIT/ML ~~LOC~~ SOLN: 10.0000 [IU] | Freq: Three times a day (TID) | SUBCUTANEOUS | Status: DC

## 2016-01-31 MED ORDER — INSULIN GLARGINE 100 UNIT/ML ~~LOC~~ SOLN
40.0000 [IU] | Freq: Every day | SUBCUTANEOUS | Status: DC
  Administered 2016-02-01 – 2016-02-06 (×6): 40 [IU] via SUBCUTANEOUS
  Filled 2016-01-31 (×8): qty 0.4

## 2016-01-31 MED ORDER — SODIUM CHLORIDE 0.9 % IV BOLUS (SEPSIS)
500.0000 mL | Freq: Once | INTRAVENOUS | Status: AC
  Administered 2016-01-31: 500 mL via INTRAVENOUS

## 2016-01-31 MED ORDER — ENOXAPARIN SODIUM 40 MG/0.4ML ~~LOC~~ SOLN
40.0000 mg | SUBCUTANEOUS | Status: DC
Start: 2016-01-31 — End: 2016-02-01
  Administered 2016-01-31: 40 mg via SUBCUTANEOUS
  Filled 2016-01-31: qty 0.4

## 2016-01-31 MED ORDER — METOPROLOL TARTRATE 25 MG PO TABS
12.5000 mg | ORAL_TABLET | Freq: Two times a day (BID) | ORAL | Status: DC
  Filled 2016-01-31: qty 1

## 2016-01-31 NOTE — Progress Notes (Signed)
Physical Therapy Treatment Patient Details Name: Jason HerculesMyron L Marmo MRN: [id number] DOB: [birth year] Today's Date: 01/31/2016    History of Present Illness Pt is a 82 year old male with osteomylitis of the foot here for amuptation of toe. Patient is s/p right great toe amputation with debridement on 01/28/16; He was at short term rehab facility prior to admittance where he was getting care for last few weeks.     PT Comments    Pt offered and encouraged to participate this am.  He agreed this session but refused any edge of bed or transfers/gait.  "The last time I stood on my foot it hurt and I'm not doing it"  He did agree to supine exercised but put minimal effort into activity.  "I'm almost 89 and I am not going to live forever"  Encouragement given. Declined further exercises or activity.   Follow Up Recommendations  SNF     Equipment Recommendations  None recommended by PT    Recommendations for Other Services       Precautions / Restrictions Precautions Precautions: Fall Required Braces or Orthoses: Other Brace/Splint Restrictions Weight Bearing Restrictions: No RLE Weight Bearing: Weight bearing as tolerated    Mobility  Bed Mobility               General bed mobility comments: refused   Transfers                 General transfer comment: refused  Ambulation/Gait                 Stairs            Wheelchair Mobility    Modified Rankin (Stroke Patients Only)       Balance                                    Cognition Arousal/Alertness: Lethargic Behavior During Therapy: WFL for tasks assessed/performed Overall Cognitive Status: Within Functional Limits for tasks assessed                      Exercises Other Exercises Other Exercises: supine AAROM B/LE for ankle pumps, heel slides, ab/add, and slr's    General Comments        Pertinent Vitals/Pain Pain Assessment: 0-10 Pain Score: 5  Pain  Location: right foot Pain Descriptors / Indicators: Sore Pain Intervention(s): Limited activity within patient's tolerance    Home Living                      Prior Function            PT Goals (current goals can now be found in the care plan section)      Frequency    7X/week      PT Plan Discharge plan needs to be updated    Co-evaluation             End of Session           Time: 1021-1029 PT Time Calculation (min) (ACUTE ONLY): 8 min  Charges:  $Therapeutic Exercise: 8-22 mins                    G Codes:      Danielle DessSarah Khila Papp 01/31/2016, 10:33 AM

## 2016-01-31 NOTE — Consult Note (Signed)
[place]  CARDIOLOGY CONSULT NOTE  Patient ID: Jason [name] MRN: [id number] DOB/AGE: 06-09-1932 82 y.o.  Admit date: 01/25/2016 Referring Physician Dr. Sissy Hoff Primary Physician  Dr. Kym Groom Primary Cardiologist   Reason for Consultation abnormal troponin  HPI: Patient is a 82 year old male with history of peripheral edema, shortness of breath, hypertension. He also has a history of mild aortic stenosis. Echocardiogram approximately one year ago showed an ejection fraction of 50-55% with mild aortic stenosis mild MR and trivial TR. He had patchy infiltrate on a chest x-ray in the past and complained of some dysphagia. He was admitted with diabetic foot ulcer on his right foot. This required amputation of his toe and debridement of the wound. He is currently recovering from this. He has occasional shortness of breath and chest tightness. Serum troponin was drawn which revealed mildly elevated at 0.88 increasing to 1.02. Echocardiogram revealed ejection fraction of 45-50% with continued to moderate aortic stenosis. He currently is pain-free. He is recovering from his debridement of his right lower extremity. He is currently being treated with antibiotics. He also is on Plavix. He is simvastatin at 20 mg daily. Patient also tenderness acute on chronic renal insufficiency with a serum creatinine 1.72. Chest x-ray revealed mild cardiomegaly with diffuse interstitial infiltrates. There is a small left pleural effusion. VQ scan revealed no pulmonary embolus. EKG revealed sinus tachycardia with nonspecific ST T wave changes with mild depression in the inferolateral leads.  Review of Systems  HENT: Negative.   Eyes: Negative.   Respiratory: Positive for shortness of breath.   Cardiovascular: Positive for chest pain.  Gastrointestinal: Negative.   Genitourinary: Negative.   Musculoskeletal: Negative.   Skin: Negative.   Neurological: Positive for weakness.   Endo/Heme/Allergies: Negative.   Psychiatric/Behavioral: Negative.     Past Medical History:  Diagnosis Date  . BPH (benign prostatic hyperplasia)   . Constipation   . COPD (chronic obstructive pulmonary disease) ([place])   . Diabetes mellitus without complication ([place])   . GERD (gastroesophageal reflux disease)   . HTN (hypertension)   . Hypothyroidism   . Stroke [facility])     Family History  Problem Relation Age of Onset  . Diabetes Mother   . Thyroid disease Mother   . Pancreatic cancer Father     Social History   Social History  . Marital status: Married    Spouse name: N/A  . Number of children: N/A  . Years of education: N/A   Occupational History  . Not on file.   Social History Main Topics  . Smoking status: Former Research scientist (life sciences)  . Smokeless tobacco: Never Used  . Alcohol use No  . Drug use: No  . Sexual activity: Not on file   Other Topics Concern  . Not on file   Social History Narrative  . No narrative on file    Past Surgical History:  Procedure Laterality Date  . AMPUTATION TOE Right 01/28/2016   Procedure: AMPUTATION TOE;  Surgeon: Sharlotte Alamo, DPM;  Location: [facility] ORS;  Service: Podiatry;  Laterality: Right;  . IRRIGATION AND DEBRIDEMENT FOOT Right 01/28/2016   Procedure: IRRIGATION AND DEBRIDEMENT FOOT;  Surgeon: Sharlotte Alamo, DPM;  Location: [facility] ORS;  Service: Podiatry;  Laterality: Right;  . PERIPHERAL VASCULAR CATHETERIZATION Right 01/10/2016   Procedure: Lower Extremity Angiography;  Surgeon: Katha Cabal, MD;  Location: [facility] CV LAB;  Service: Cardiovascular;  Laterality: Right;  . PERIPHERAL VASCULAR CATHETERIZATION N/A 01/10/2016   Procedure:  Abdominal Aortogram w/Lower Extremity;  Surgeon: Katha Cabal, MD;  Location: [facility] CV LAB;  Service: Cardiovascular;  Laterality: N/A;  . PERIPHERAL VASCULAR CATHETERIZATION  01/10/2016   Procedure: Lower Extremity Intervention;  Surgeon: Katha Cabal, MD;  Location: [facility]  CV LAB;  Service: Cardiovascular;;  . ROTATOR CUFF REPAIR Left   . THYROID SURGERY       Prescriptions Prior to Admission  Medication Sig Dispense Refill Last Dose  . albuterol (PROVENTIL HFA;VENTOLIN HFA) 108 (90 Base) MCG/ACT inhaler Inhale 2 puffs into the lungs.    prn at prn  . Amino Acids-Protein Hydrolys (FEEDING SUPPLEMENT, PRO-STAT SUGAR FREE 64,) LIQD Take 30 mLs by mouth 3 (three) times daily with meals.   01/24/2016 at 2000  . aspirin 81 MG tablet Take 81 mg by mouth daily.   01/24/2016 at 0800  . budesonide-formoterol (SYMBICORT) 160-4.5 MCG/ACT inhaler Inhale 2 puffs into the lungs 2 (two) times daily.   01/24/2016 at 2000  . clopidogrel (PLAVIX) 75 MG tablet Take 1 tablet (75 mg total) by mouth daily. 30 tablet 5 01/24/2016 at 0900  . docusate sodium (COLACE) 100 MG capsule Take 200 mg by mouth 2 (two) times daily.   01/24/2016 at Unknown time  . FLUoxetine (PROZAC) 40 MG capsule Take 40 mg by mouth daily.   01/24/2016 at 0800  . insulin lispro (HUMALOG) 100 UNIT/ML injection Inject 20 Units into the skin. 0-150=0 151-200=2unit 201-250=4units 251-300=6 units 301-350=8 units > 351= 10 units & recheck in 2 hours   01/24/2016 at Unknown time  . LANTUS SOLOSTAR 100 UNIT/ML Solostar Pen Inject 90 Units into the skin daily. subQ at 1630 with dinner   01/24/2016 at 1630  . senna (SENOKOT) 8.6 MG TABS tablet Take 2 tablets by mouth 2 (two) times daily.   01/24/2016 at 2000  . simvastatin (ZOCOR) 20 MG tablet Take 20 mg by mouth at bedtime.   01/24/2016 at 2000  . SYNTHROID 125 MCG tablet Take 125 mcg by mouth daily.   01/25/2016 at 0600  . tamsulosin (FLOMAX) 0.4 MG CAPS capsule Take 0.4 mg by mouth daily.   01/24/2016 at 0800  . traMADol (ULTRAM) 50 MG tablet Take 50 mg by mouth 4 (four) times daily as needed.   01/24/2016 at 2000  . blood glucose meter kit and supplies KIT Dispense based on patient and insurance preference. Use up to four times daily as directed. (FOR ICD-9 250.00, 250.01). 1  each 0 Past Month at Unknown time  . oxyCODONE (OXY IR/ROXICODONE) 5 MG immediate release tablet Take 1 tablet (5 mg total) by mouth every 4 (four) hours as needed for moderate pain. 30 tablet 0 Past Week at Unknown time    Physical Exam: Blood pressure (!) 99/48, pulse (!) 106, temperature 98.1 F (36.7 C), temperature source Oral, resp. rate 19, height 5' 8"  (1.727 m), weight 117.8 kg (259 lb 12.8 oz), SpO2 92 %.   Wt Readings from Last 1 Encounters:  01/25/16 117.8 kg (259 lb 12.8 oz)     General appearance: alert and cooperative Resp: clear to auscultation bilaterally Chest wall: no tenderness Cardio: regular rate and rhythm GI: soft, non-tender; bowel sounds normal; no masses,  no organomegaly Extremities: Right lower extremity boot in place. 1+ edema on left. Pulses: Diminished lower extremity pulses bilaterally Neurologic: Grossly normal  Labs:   Lab Results  Component Value Date   WBC 8.1 01/29/2016   HGB 9.4 (L) 01/29/2016   HCT 28.8 (L) 01/29/2016  MCV 85.8 01/29/2016   PLT 122 (L) 01/29/2016    Recent Labs Lab 01/31/16 0328  NA 132*  K 4.6  CL 95*  CO2 28  BUN 24*  CREATININE 1.72*  CALCIUM 7.6*  GLUCOSE 325*   Lab Results  Component Value Date   TROPONINI 0.82 ([place]) 01/30/2016      Radiology: Cardiomegaly with mild volume overload EKG: Sinus rhythm with nonspecific changes  ASSESSMENT AND PLAN:  Patient is 82 year old male with history of mild to moderate aortic stenosis who is admitted with a failed to heal lower extremity diabetic foot ulcer. He is status post debridement. He had an elevation in his troponin several days postoperatively. This is associated with some chest discomfort. His echocardiogram shows no significant change from previous one. This is likely increased myocardial demand secondary to increased myocardial work in the face of mild to moderate aortic stenosis. Would continue with aspirin and Plavix as well as topical nitrates and beta  blockers. Not a candidate for invasive evaluation at this point given recent infected lower extremity. Would continue to treat his lower extremity. He appears stable at present time. Consideration for further noninvasive or invasive ischemic workup after covering from his surgery and infectious process. We'll follow with you. Signed: Teodoro Spray MD, [facility] 01/31/2016, 7:24 AM

## 2016-01-31 NOTE — Progress Notes (Signed)
Pts BP 93/52 HR 103. MD Luberta MutterKonidena notified. Orders received for 500 cc NS fluid bolus. Order placed.

## 2016-01-31 NOTE — Progress Notes (Signed)
PT Cancellation Note  Patient Details Name: Jason [name] MRN: [id number] DOB: 1932/09/18   Cancelled Treatment:    Reason Eval/Treat Not Completed: Patient declined, no reason specified   Pt offered and encouraged to participate in session this am.  "Just not feeling good"  Explained importance of mobility but he continued to decline.  Danielle DessSarah Clatie Kessen 01/31/2016, 9:10 AM

## 2016-01-31 NOTE — Progress Notes (Signed)
Per MD progress note today patient is not stable for D/C. Plan [facility] for [facility]. Elana AlmKelly [facility] staff is aware of above. Clinical Social Worker (CSW) will continue to follow and assist as needed.   Baker Hughes IncorporatedBailey Aramis Weil, LCSW [phone]

## 2016-01-31 NOTE — Progress Notes (Signed)
Inpatient Diabetes Program Recommendations  AACE/ADA: New Consensus Statement on Inpatient Glycemic Control (2015)  Target Ranges:  Prepandial:   less than 140 mg/dL      Peak postprandial:   less than 180 mg/dL (1-2 hours)      Critically ill patients:  140 - 180 mg/dL   Results for Almon HerculesMARTIN, Cowan L (MRN [id number]) as of 01/31/2016 09:05  Ref. Range 01/30/2016 07:44 01/30/2016 12:06 01/30/2016 15:25 01/30/2016 16:50 01/30/2016 21:13  Glucose-Capillary Latest Ref Range: 65 - 99 mg/dL 409324 (H) 811297 (H) 914335 (H) 317 (H) 331 (H)   Results for Almon HerculesMARTIN, Ajdin L (MRN [id number]) as of 01/31/2016 09:05  Ref. Range 01/31/2016 07:26  Glucose-Capillary Latest Ref Range: 65 - 99 mg/dL 086352 (H)    Home DM Meds: Lantus 90 units QPM                             Humalog 20 units TIDWC                             Humalog SSI  Current Insulin Orders: Lantus 30 units QPM                                       Novolog Moderate Correction Scale/ SSI (0-15 units) TID AC + HS       MD- Please consider the following in-hospital insulin adjustments:  1. Increase Lantus to 45 units QPM (50% home dose)  2. Start Novolog Meal Coverage: Novolog 10 units TIDWC (hold if pt eats <50% of meal)  This would be about 50% of total home dose of rapid-acting insulin     --Will follow patient during hospitalization--  Ambrose FinlandJeannine Johnston Raford Brissett RN, MSN, CDE Diabetes Coordinator Inpatient Glycemic Control Team Team Pager: [phone] (8a-5p)

## 2016-01-31 NOTE — Progress Notes (Signed)
Nutrition Follow-up  DOCUMENTATION CODES:   Severe malnutrition in context of acute illness/injury  INTERVENTION:  1. Glucerna Shake po TID, each supplement provides 220 kcal and 10 grams of protein  NUTRITION DIAGNOSIS:   Malnutrition related to acute illness as evidenced by energy intake < or equal to 50% for > or equal to 5 days, percent weight loss. -ongoing  GOAL:   Patient will meet greater than or equal to 90% of their needs -progressing  MONITOR:   PO intake, Supplement acceptance, I & O's, Labs, Weight trends  REASON FOR ASSESSMENT:   Malnutrition Screening Tool    ASSESSMENT:   Jason [name]  is a 82 y.o. male with a known history of type 2 diabetes, insulin requiring who is presenting from podiatry clinic for further management of right foot ulcer.   Mr. Jason [name] was a little distressed when I visited, states "I messed myself." Documented Meal Completion thus far: 66.25% Could not recall breakfast at this time. Consuming glucerna shakes regularly at this time. Labs and medications reviewed.  Diet Order:  Diet Carb Modified Fluid consistency: Thin; Room service appropriate? Yes  Skin:  Wound (see comment) (osteomyelitis of R foot)  Last BM:  01/25/2016  Height:   Ht Readings from Last 1 Encounters:  01/25/16 5\' 8"  (1.727 m)    Weight:   Wt Readings from Last 1 Encounters:  01/25/16 259 lb 12.8 oz (117.8 kg)    Ideal Body Weight:  70 kg  BMI:  Body mass index is 39.5 kg/m.  Estimated Nutritional Needs:   Kcal:  2000-2450 calories (25-30 cal/kg ABW)  Protein:  117-140 gm  Fluid:  >/= 2L  EDUCATION NEEDS:   No education needs identified at this time  Dionne AnoWilliam M. Klayton Monie, MS, RD LDN Inpatient Clinical Dietitian Pager [phone]

## 2016-01-31 NOTE — Progress Notes (Signed)
[facility] [place] [facility]   PATIENT NAME: Jason [name]    MR#:  [id number]  DATE OF BIRTH:  [birth year]  SUBJECTIVE blood pressure is low this morning. Patient denies any complaints except not feeling well.  He has no appetite..    Came in with ongoing infection of the right foot,s/p right great toe amputation. POD #3   REVIEW OF SYSTEMS:   Review of Systems  Constitutional: Negative for chills, fever and weight loss.  HENT: Negative for ear discharge, ear pain and nosebleeds.   Eyes: Negative for blurred vision, pain and discharge.  Respiratory: Negative for sputum production, shortness of breath, wheezing and stridor.   Cardiovascular: Negative for chest pain, palpitations, orthopnea and PND.  Gastrointestinal: Negative for abdominal pain, diarrhea, nausea and vomiting.  Genitourinary: Negative for frequency and urgency.  Musculoskeletal: Positive for joint pain. Negative for back pain.  Neurological: Positive for weakness. Negative for sensory change, speech change and focal weakness.  Psychiatric/Behavioral: Negative for depression and hallucinations. The patient is not nervous/anxious.    Tolerating Diet:yes Tolerating PT: pending  DRUG ALLERGIES:  No Known Allergies  VITALS:  Blood pressure (!) 93/52, pulse (!) 102, temperature 97.4 F (36.3 C), temperature source Oral, resp. rate 18, height 5\' 8"  (1.727 m), weight 117.8 kg (259 lb 12.8 oz), SpO2 94 %.  PHYSICAL EXAMINATION:   Physical Exam  GENERAL:  82 y.o.-year-old patient lying in the bed with no acute distress.  EYES: Pupils equal, round, reactive to light and accommodation. No scleral icterus. Extraocular muscles intact.  HEENT: Head atraumatic, normocephalic. Oropharynx and nasopharynx clear.  NECK:  Supple, no jugular venous distention. No thyroid enlargement, no tenderness.  LUNGS: Normal breath sounds bilaterally, no wheezing, rales, rhonchi. No use of accessory muscles of  respiration.  CARDIOVASCULAR: S1, S2 normal. Ejection systolic murmur present in aortic area., rubs, or gallops.  ABDOMEN: Soft, nontender, nondistended. Bowel sounds present. No organomegaly or mass.  EXTREMITIES: No cyanosis, clubbing or edema b/l.   Right chronic nonhealling ulcers/infection of the great toe and 5th digit s/p surgical dressing + NEUROLOGIC: Cranial nerves II through XII are intact. No focal Motor or sensory deficits b/l.   PSYCHIATRIC:  patient is alert and oriented x 3.  SKIN: No obvious rash, lesion, or ulcer.   LABORATORY PANEL:  CBC  Recent Labs Lab 01/29/16 0118  WBC 8.1  HGB 9.4*  HCT 28.8*  PLT 122*    Chemistries   Recent Labs Lab 01/31/16 0328  NA 132*  K 4.6  CL 95*  CO2 28  GLUCOSE 325*  BUN 24*  CREATININE 1.72*  CALCIUM 7.6*  MG 2.0   Cardiac Enzymes  Recent Labs Lab 01/30/16 1952  TROPONINI 0.82*   RADIOLOGY:  Dg Chest 2 View  Result Date: 01/30/2016 CLINICAL DATA:  Hypoxia.  COPD.  Right foot osteomyelitis. EXAM: CHEST  2 VIEW COMPARISON:  None. FINDINGS: Mild cardiomegaly. Aortic atherosclerosis. Diffuse interstitial infiltrates are seen, consistent with diffuse interstitial edema. Small left pleural effusion and left lower lobe atelectasis also demonstrated. IMPRESSION: Mild cardiomegaly and diffuse interstitial infiltrates, consistent with pulmonary edema. Small left pleural effusion left lower lobe atelectasis. Aortic atherosclerosis. Electronically Signed   By: Myles RosenthalJohn  Stahl M.D.   On: 01/30/2016 08:13   Nm Pulmonary Perf And Vent  Result Date: 01/30/2016 CLINICAL DATA:  Chest tightness.  Tachycardia. EXAM: NUCLEAR MEDICINE VENTILATION - PERFUSION LUNG SCAN TECHNIQUE: Ventilation images were obtained in multiple projections using inhaled aerosol Tc-2614m DTPA.  Perfusion images were obtained in multiple projections after intravenous injection of Tc-4549m MAA. RADIOPHARMACEUTICALS:  32.76 mCi Technetium-4849m DTPA aerosol inhalation and  3.987 mCi Technetium-3749m MAA IV COMPARISON:  Current chest radiograph FINDINGS: Ventilation: No focal ventilation defect. Perfusion: No wedge shaped peripheral perfusion defects to suggest acute pulmonary embolism. IMPRESSION: No evidence of a pulmonary thromboembolism. Electronically Signed   By: Amie Portlandavid  Ormond M.D.   On: 01/30/2016 13:55   ASSESSMENT AND PLAN:  82 year old male history of insulin requiring type 2 diabetes presenting with foot ulcer.  # Acute chf with acute hypoxic resp failure;O2 sats 88% on room air yesterday, on 2 L it is 94 today. Symptoms improved. She received IV Lasix yesterday. Echo showed EF 45%. Seen by cardiology. Patient has moderate aortic stenosis. Started on beta blockers and nitrates.. Elevated troponin secondary to demand ischemia rather than acute MI. Further cardiology workup after he recovers from acute illness. Iatrogenic  hypotension likely due to metoprolol, lasix, nitrates. received a small amount of fluid bolus, patient feels so fatigued  likely secondary to hypotension.  # Right foot osteomyelitis: podiatry consult with Dr Alberteen Spindleline noted -s/p Amputation right great toe with partial first ray resection. Excisional debridement ulceration right fifth metatarsal full-thickness to joint capsule by Dr cline -MRI right foot results noted -changed to po augmentin and cipro Dressing changes 3 times a week if he was nursing well, follow up wit  Podiatry  one week.  # Type 2 diabetes insulin requiring: Elevated blood glucose: We started the Lantus, NovoLog 10 units 3 times a day. # Hypothyroidism unspecified: synthroid  # Essential hypertension: Now hypotensive so hold the Vaseretic.  # DVT prophylaxis Lovneox  # Hypokalemia repleted  Chronic renal failure, CK D stage III: Stable.  D/w pt and RN   Case discussed with Care Management/Social Worker. Management plans discussed with the patient, family and they are in agreement.  CODE STATUS: FULL  DVT  Prophylaxis: lovenox  TOTAL CC TIME TAKING CARE OF THIS PATIENT: 35 minutes.   POSSIBLE D/C IN 1-2 DAYS, DEPENDING ON CLINICAL CONDITION.  Katha HammingKONIDENA,Zong Mcquarrie M.D on 01/31/2016 at 10:12 AM  Between 7am to 6pm - Pager - [phone]  After 6pm go to [email or web address] - password [email or web address] [facility]  [facility]  Office  [phone]  CC: Primary care physician; Dione Housekeeperlmedo, Mario Ernesto, MD   Note: This dictation was prepared with Dragon dictation along with smaller phrase technology. Any transcriptional errors that result from this process are unintentional.

## 2016-01-31 NOTE — Progress Notes (Signed)
3 Days Post-Op  Subjective: Patient seen. States that he in general does not feel very well this morning. Mostly relates his stomach area. Not really having any pain in the foot.  Objective: Vital signs in last 24 hours: Temp:  [97.4 F (36.3 C)-98.9 F (37.2 C)] 97.4 F (36.3 C) (11/14 0802) Pulse Rate:  [102-111] 102 (11/14 0802) Resp:  [18-19] 18 (11/14 0802) BP: (93-103)/(45-63) 93/52 (11/14 0802) SpO2:  [86 %-98 %] 94 % (11/14 0802) Last BM Date: 01/30/16  Intake/Output from previous day: 11/13 0701 - 11/14 0700 In: 240 [P.O.:240] Out: -  Intake/Output this shift: No intake/output data recorded.  Still some mild bleeding on the bandaging. Slightly more around the great toe amputation site. Upon removal no evidence of any purulence or cellulitis. Edema significantly improved. Incision is well coapted at the amputation site but there is some increased necrosis of the plantar skin flap along the medial portion of the incision. Mild increase in erythema around the inferior aspect of the heel ulceration.  Lab Results:   Recent Labs  01/29/16 0118  WBC 8.1  HGB 9.4*  HCT 28.8*  PLT 122*   BMET  Recent Labs  01/30/16 0448 01/31/16 0328  NA 131* 132*  K 4.3 4.6  CL 95* 95*  CO2 28 28  GLUCOSE 325* 325*  BUN 19 24*  CREATININE 1.63* 1.72*  CALCIUM 7.3* 7.6*   PT/INR No results for input(s): LABPROT, INR in the last 72 hours. ABG No results for input(s): PHART, HCO3 in the last 72 hours.  Invalid input(s): PCO2, PO2  Studies/Results: Dg Chest 2 View  Result Date: 01/30/2016 CLINICAL DATA:  Hypoxia.  COPD.  Right foot osteomyelitis. EXAM: CHEST  2 VIEW COMPARISON:  None. FINDINGS: Mild cardiomegaly. Aortic atherosclerosis. Diffuse interstitial infiltrates are seen, consistent with diffuse interstitial edema. Small left pleural effusion and left lower lobe atelectasis also demonstrated. IMPRESSION: Mild cardiomegaly and diffuse interstitial infiltrates,  consistent with pulmonary edema. Small left pleural effusion left lower lobe atelectasis. Aortic atherosclerosis. Electronically Signed   By: Myles RosenthalJohn  Stahl M.D.   On: 01/30/2016 08:13   Nm Pulmonary Perf And Vent  Result Date: 01/30/2016 CLINICAL DATA:  Chest tightness.  Tachycardia. EXAM: NUCLEAR MEDICINE VENTILATION - PERFUSION LUNG SCAN TECHNIQUE: Ventilation images were obtained in multiple projections using inhaled aerosol Tc-3748m DTPA. Perfusion images were obtained in multiple projections after intravenous injection of Tc-3048m MAA. RADIOPHARMACEUTICALS:  32.76 mCi Technetium-3348m DTPA aerosol inhalation and 3.987 mCi Technetium-2548m MAA IV COMPARISON:  Current chest radiograph FINDINGS: Ventilation: No focal ventilation defect. Perfusion: No wedge shaped peripheral perfusion defects to suggest acute pulmonary embolism. IMPRESSION: No evidence of a pulmonary thromboembolism. Electronically Signed   By: Amie Portlandavid  Ormond M.D.   On: 01/30/2016 13:55    Anti-infectives: Anti-infectives    Start     Dose/Rate Route Frequency Ordered Stop   01/29/16 1330  ciprofloxacin (CIPRO) tablet 500 mg     500 mg Oral 2 times daily 01/29/16 1131     01/29/16 1330  amoxicillin-clavulanate (AUGMENTIN) 875-125 MG per tablet 1 tablet     1 tablet Oral Every 12 hours 01/29/16 1131     01/29/16 0800  vancomycin (VANCOCIN) 1,250 mg in sodium chloride 0.9 % 250 mL IVPB  Status:  Discontinued     1,250 mg 166.7 mL/hr over 90 Minutes Intravenous Every 24 hours 01/29/16 0203 01/29/16 1131   01/26/16 0600  piperacillin-tazobactam (ZOSYN) IVPB 3.375 g  Status:  Discontinued     3.375 g  12.5 mL/hr over 240 Minutes Intravenous Every 8 hours 01/25/16 2009 01/29/16 1131   01/26/16 0200  vancomycin (VANCOCIN) 1,500 mg in sodium chloride 0.9 % 500 mL IVPB  Status:  Discontinued     1,500 mg 250 mL/hr over 120 Minutes Intravenous Every 24 hours 01/25/16 2008 01/29/16 0200   01/25/16 2200  piperacillin-tazobactam (ZOSYN) IVPB 3.375 g   Status:  Discontinued     3.375 g 12.5 mL/hr over 240 Minutes Intravenous Every 8 hours 01/25/16 1751 01/25/16 1753   01/25/16 1830  vancomycin (VANCOCIN) IVPB 1000 mg/200 mL premix     1,000 mg 200 mL/hr over 60 Minutes Intravenous  Once 01/25/16 1752 01/25/16 2105   01/25/16 1830  piperacillin-tazobactam (ZOSYN) IVPB 3.375 g     3.375 g 12.5 mL/hr over 240 Minutes Intravenous  Once 01/25/16 1753 01/26/16 0005   01/25/16 1800  vancomycin (VANCOCIN) IVPB 1000 mg/200 mL premix  Status:  Discontinued     1,000 mg 200 mL/hr over 60 Minutes Intravenous Every 24 hours 01/25/16 1751 01/25/16 1753      Assessment/Plan: s/p Procedure(s): AMPUTATION TOE (Right) IRRIGATION AND DEBRIDEMENT FOOT (Right) Assessment: Status post amputation debridement with some necrosis of the hallux skin flap.   Plan: Betadine applied to the amputation site followed by a sterile bandage. Tried to make this a little bit more loosely wrapped to decrease pressure on the areas. Discussed with the patient that he may require some additional debridement in the future but at this point we will allow the area to demarcate. Should still be stable for discharge. He will need dressing changes 3 times a week at the skilled nursing facility for drainage and wound assessment. Still plan to follow him up outpatient next week  LOS: 6 days    Ricci Barkerodd W Deliah Strehlow 01/31/2016

## 2016-01-31 NOTE — Consult Note (Signed)
MEDICATION RELATED CONSULT NOTE - Follow up  Pharmacy Consult for electrolytes Indication: hypokalemia, hypomagnesemia  No Known Allergies  Patient Measurements: Height: 5\' 8"  (172.7 cm) Weight: 259 lb 12.8 oz (117.8 kg) IBW/kg (Calculated) : 68.4  Vital Signs: Temp: 97.4 F (36.3 C) (11/14 0802) Temp Source: Oral (11/14 0802) BP: 93/52 (11/14 0802) Pulse Rate: 102 (11/14 0802) Intake/Output from previous day: 11/13 0701 - 11/14 0700 In: 240 [P.O.:240] Out: -  Intake/Output from this shift: No intake/output data recorded.  Labs:  Recent Labs  01/29/16 0118 01/30/16 0448 01/31/16 0328  WBC 8.1  --   --   HGB 9.4*  --   --   HCT 28.8*  --   --   PLT 122*  --   --   CREATININE 1.71* 1.63* 1.72*  MG 1.6* 1.9 2.0  PHOS 2.9 2.7  --    Estimated Creatinine Clearance: 42 mL/min (by C-G formula based on SCr of 1.72 mg/dL (H)).   Assessment: Pt is a 82 year old male with osteomyelitis of the foot here for amuptation of toe. Found to have hypokalemia and hypomagnesemia.  11/14: K 4.6, Mag 2.0  Goal of Therapy:  Electrolytes within normal limits  Plan:  Electrolyte WNL. No additional supplementation needed today.  Will follow up on labs in AM.    Marty HeckWang, Achillies Buehl L, PharmD, BCPS Clinical Pharmacist 01/31/2016,9:13 AM

## 2016-02-01 ENCOUNTER — Encounter (INDEPENDENT_AMBULATORY_CARE_PROVIDER_SITE_OTHER): Payer: Medicare Other

## 2016-02-01 ENCOUNTER — Ambulatory Visit (INDEPENDENT_AMBULATORY_CARE_PROVIDER_SITE_OTHER): Payer: Medicare Other | Admitting: Vascular Surgery

## 2016-02-01 ENCOUNTER — Inpatient Hospital Stay: Payer: Medicare Other

## 2016-02-01 LAB — BASIC METABOLIC PANEL
ANION GAP: 12 (ref 5–15)
Anion gap: 11 (ref 5–15)
BUN: 30 mg/dL — ABNORMAL HIGH (ref 6–20)
BUN: 36 mg/dL — ABNORMAL HIGH (ref 6–20)
CALCIUM: 7.9 mg/dL — AB (ref 8.9–10.3)
CALCIUM: 8 mg/dL — AB (ref 8.9–10.3)
CO2: 25 mmol/L (ref 22–32)
CO2: 25 mmol/L (ref 22–32)
CREATININE: 1.97 mg/dL — AB (ref 0.61–1.24)
Chloride: 95 mmol/L — ABNORMAL LOW (ref 101–111)
Chloride: 93 mmol/L — ABNORMAL LOW (ref 101–111)
Creatinine, Ser: 1.76 mg/dL — ABNORMAL HIGH (ref 0.61–1.24)
GFR calc Af Amer: 40 mL/min — ABNORMAL LOW (ref >60)
GFR calc non Af Amer: 35 mL/min — ABNORMAL LOW (ref >60)
GFR, EST AFRICAN AMERICAN: 35 mL/min — AB (ref >60)
GFR, EST NON AFRICAN AMERICAN: 30 mL/min — AB (ref >60)
GLUCOSE: 300 mg/dL — AB (ref 65–99)
Glucose, Bld: 412 mg/dL — ABNORMAL HIGH (ref 65–99)
POTASSIUM: 4.7 mmol/L (ref 3.5–5.1)
Potassium: 4.9 mmol/L (ref 3.5–5.1)
SODIUM: 129 mmol/L — AB (ref 135–145)
Sodium: 132 mmol/L — ABNORMAL LOW (ref 135–145)

## 2016-02-01 LAB — CBC
HEMATOCRIT: 30.4 % — AB (ref 40.0–52.0)
HEMATOCRIT: 31 % — AB (ref 40.0–52.0)
HEMATOCRIT: 31 % — AB (ref 40.0–52.0)
HEMOGLOBIN: 10.3 g/dL — AB (ref 13.0–18.0)
HEMOGLOBIN: 10.4 g/dL — AB (ref 13.0–18.0)
Hemoglobin: 10 g/dL — ABNORMAL LOW (ref 13.0–18.0)
MCH: 28.5 pg (ref 26.0–34.0)
MCH: 28.3 pg (ref 26.0–34.0)
MCH: 28.6 pg (ref 26.0–34.0)
MCHC: 32.9 g/dL (ref 32.0–36.0)
MCHC: 33.1 g/dL (ref 32.0–36.0)
MCHC: 33.7 g/dL (ref 32.0–36.0)
MCV: 86.8 fL (ref 80.0–100.0)
MCV: 85.5 fL (ref 80.0–100.0)
MCV: 84.9 fL (ref 80.0–100.0)
Platelets: 134 10*3/uL — ABNORMAL LOW (ref 150–440)
Platelets: 135 10*3/uL — ABNORMAL LOW (ref 150–440)
Platelets: 150 10*3/uL (ref 150–440)
RBC: 3.5 MIL/uL — ABNORMAL LOW (ref 4.40–5.90)
RBC: 3.63 MIL/uL — AB (ref 4.40–5.90)
RBC: 3.65 MIL/uL — ABNORMAL LOW (ref 4.40–5.90)
RDW: 14.6 % — AB (ref 11.5–14.5)
RDW: 14.6 % — ABNORMAL HIGH (ref 11.5–14.5)
RDW: 14.9 % — AB (ref 11.5–14.5)
WBC: 10.3 10*3/uL (ref 3.8–10.6)
WBC: 10 10*3/uL (ref 3.8–10.6)
WBC: 10.1 10*3/uL (ref 3.8–10.6)

## 2016-02-01 LAB — GLUCOSE, CAPILLARY
GLUCOSE-CAPILLARY: 347 mg/dL — AB (ref 65–99)
GLUCOSE-CAPILLARY: 408 mg/dL — AB (ref 65–99)
Glucose-Capillary: 319 mg/dL — ABNORMAL HIGH (ref 65–99)
Glucose-Capillary: 396 mg/dL — ABNORMAL HIGH (ref 65–99)
Glucose-Capillary: 292 mg/dL — ABNORMAL HIGH (ref 65–99)

## 2016-02-01 LAB — APTT: APTT: 41 s — AB (ref 24–36)

## 2016-02-01 LAB — PROTIME-INR
INR: 1.02
PROTHROMBIN TIME: 13.4 s (ref 11.4–15.2)

## 2016-02-01 LAB — TROPONIN I
TROPONIN I: 1.09 ng/mL — AB (ref <0.03)
TROPONIN I: 1.08 ng/mL — AB (ref <0.03)
TROPONIN I: 1.19 ng/mL — AB (ref <0.03)

## 2016-02-01 LAB — MAGNESIUM: Magnesium: 2.1 mg/dL (ref 1.7–2.4)

## 2016-02-01 LAB — HEPARIN LEVEL (UNFRACTIONATED): Heparin Unfractionated: <0.1 IU/mL — ABNORMAL LOW (ref 0.30–0.70)

## 2016-02-01 MED ORDER — SODIUM CHLORIDE 0.9 % IV SOLN: 250.0000 mL | INTRAVENOUS | Status: DC | PRN

## 2016-02-01 MED ORDER — NITROGLYCERIN 0.4 MG SL SUBL
SUBLINGUAL_TABLET | SUBLINGUAL | Status: AC
  Administered 2016-02-01: 0.4 mg
  Filled 2016-02-01: qty 2

## 2016-02-01 MED ORDER — FUROSEMIDE 10 MG/ML IJ SOLN
20.0000 mg | Freq: Once | INTRAMUSCULAR | Status: AC
  Administered 2016-02-01: 20 mg via INTRAVENOUS
  Filled 2016-02-01: qty 2

## 2016-02-01 MED ORDER — SODIUM CHLORIDE 0.9 % WEIGHT BASED INFUSION
3.0000 mL/kg/h | INTRAVENOUS | Status: DC
  Administered 2016-02-02: 3 mL/kg/h via INTRAVENOUS

## 2016-02-01 MED ORDER — SODIUM CHLORIDE 0.9 % IV SOLN: INTRAVENOUS | Status: DC

## 2016-02-01 MED ORDER — CEFAZOLIN IN D5W 1 GM/50ML IV SOLN
1.0000 g | Freq: Three times a day (TID) | INTRAVENOUS | Status: DC
  Administered 2016-02-01: 1 g via INTRAVENOUS
  Filled 2016-02-01 (×3): qty 50

## 2016-02-01 MED ORDER — SODIUM CHLORIDE 0.9 % WEIGHT BASED INFUSION
1.0000 mL/kg/h | INTRAVENOUS | Status: DC
  Administered 2016-02-02: 1 mL/kg/h via INTRAVENOUS

## 2016-02-01 MED ORDER — SODIUM CHLORIDE 0.9% FLUSH: 3.0000 mL | INTRAVENOUS | Status: DC | PRN

## 2016-02-01 MED ORDER — SODIUM CHLORIDE 0.9% FLUSH
3.0000 mL | Freq: Two times a day (BID) | INTRAVENOUS | Status: DC
  Administered 2016-02-01: 3 mL via INTRAVENOUS

## 2016-02-01 MED ORDER — INSULIN ASPART 100 UNIT/ML ~~LOC~~ SOLN
12.0000 [IU] | Freq: Once | SUBCUTANEOUS | Status: AC
  Administered 2016-02-01: 12 [IU] via SUBCUTANEOUS

## 2016-02-01 MED ORDER — NITROGLYCERIN 0.4 MG SL SUBL
0.4000 mg | SUBLINGUAL_TABLET | SUBLINGUAL | Status: DC | PRN
  Administered 2016-02-01 – 2016-02-06 (×3): 0.4 mg via SUBLINGUAL
  Filled 2016-02-01 (×2): qty 1

## 2016-02-01 MED ORDER — HEPARIN (PORCINE) IN NACL 100-0.45 UNIT/ML-% IJ SOLN
1800.0000 [IU]/h | INTRAMUSCULAR | Status: DC
  Administered 2016-02-01: 1100 [IU]/h via INTRAVENOUS
  Administered 2016-02-02: 1450 [IU]/h via INTRAVENOUS
  Administered 2016-02-03: 1800 [IU]/h via INTRAVENOUS
  Filled 2016-02-01 (×8): qty 250

## 2016-02-01 MED ORDER — CIPROFLOXACIN IN D5W 400 MG/200ML IV SOLN
400.0000 mg | Freq: Two times a day (BID) | INTRAVENOUS | Status: DC
  Administered 2016-02-01 – 2016-02-02 (×4): 400 mg via INTRAVENOUS
  Filled 2016-02-01 (×6): qty 200

## 2016-02-01 MED ORDER — IPRATROPIUM-ALBUTEROL 0.5-2.5 (3) MG/3ML IN SOLN
3.0000 mL | RESPIRATORY_TRACT | Status: AC
  Administered 2016-02-01: 3 mL via RESPIRATORY_TRACT
  Filled 2016-02-01: qty 3

## 2016-02-01 MED ORDER — ASPIRIN 81 MG PO CHEW
81.0000 mg | CHEWABLE_TABLET | ORAL | Status: AC
  Administered 2016-02-02: 81 mg via ORAL
  Filled 2016-02-01: qty 1

## 2016-02-01 MED ORDER — NITROGLYCERIN IN D5W 200-5 MCG/ML-% IV SOLN
2.0000 ug/min | INTRAVENOUS | Status: DC
  Administered 2016-02-02: 5 ug/min via INTRAVENOUS
  Filled 2016-02-01 (×2): qty 250

## 2016-02-01 MED ORDER — SODIUM CHLORIDE 0.9 % IV SOLN
3.0000 g | Freq: Four times a day (QID) | INTRAVENOUS | Status: DC
  Administered 2016-02-01 – 2016-02-02 (×3): 3 g via INTRAVENOUS
  Filled 2016-02-01 (×8): qty 3

## 2016-02-01 MED ORDER — HEPARIN BOLUS VIA INFUSION
2000.0000 [IU] | Freq: Once | INTRAVENOUS | Status: AC
  Administered 2016-02-01: 2000 [IU] via INTRAVENOUS
  Filled 2016-02-01: qty 2000

## 2016-02-01 MED ORDER — METHYLPREDNISOLONE SODIUM SUCC 125 MG IJ SOLR
60.0000 mg | INTRAMUSCULAR | Status: DC
  Administered 2016-02-01: 60 mg via INTRAVENOUS
  Filled 2016-02-01: qty 2

## 2016-02-01 MED ORDER — SODIUM CHLORIDE 0.9 % IV SOLN
INTRAVENOUS | Status: DC
  Administered 2016-02-01: 18:00:00 via INTRAVENOUS

## 2016-02-01 NOTE — Progress Notes (Addendum)
Pts complaining of SOB, O2 sat 89-91% 2 L nasal canula. MD Luberta MutterKonidena paged. Nurse waiting on call back   MD returned call, orders received for Duoneb breathing treatment, 1 view chest xay and 3 L of 02. Orders placed.

## 2016-02-01 NOTE — Progress Notes (Signed)
Pt states he cant take his scheduled morning medications because he isn't feeling well. MD Luberta MutterKonidena notified. MD changing some medications to IV, no orders received at this time.

## 2016-02-01 NOTE — Progress Notes (Signed)
[facility] [place] [facility]   PATIENT NAME: Jason SchneidersMyron [name]    MR#:  [id number]  DATE OF BIRTH:  27-Apr-1932  C/o shortness of breath today, hypoxia. He feels nauseous.    Came in with ongoing infection of the right foot,s/p right great toe amputation. POD #3   REVIEW OF SYSTEMS:   Review of Systems  Constitutional: Negative for chills, fever and weight loss.  HENT: Negative for ear discharge, ear pain and nosebleeds.   Eyes: Negative for blurred vision, pain and discharge.  Respiratory: Negative for sputum production, shortness of breath, wheezing and stridor.   Cardiovascular: Negative for chest pain, palpitations, orthopnea and PND.  Gastrointestinal: Negative for abdominal pain, diarrhea, nausea and vomiting.  Genitourinary: Negative for frequency and urgency.  Musculoskeletal: Positive for joint pain. Negative for back pain.  Neurological: Positive for weakness. Negative for sensory change, speech change and focal weakness.  Psychiatric/Behavioral: Negative for depression and hallucinations. The patient is not nervous/anxious.    Tolerating Diet:yes Tolerating PT: pending  DRUG ALLERGIES:  No Known Allergies  VITALS:  Blood pressure 121/63, pulse (!) 106, temperature 98.4 F (36.9 C), temperature source Oral, resp. rate 20, height 5\' 8"  (1.727 m), weight 117.8 kg (259 lb 12.8 oz), SpO2 96 %.  PHYSICAL EXAMINATION:   Physical Exam  GENERAL:  82 y.o.-year-old patient lying in the bed with no acute distress.  EYES: Pupils equal, round, reactive to light and accommodation. No scleral icterus. Extraocular muscles intact.  HEENT: Head atraumatic, normocephalic. Oropharynx and nasopharynx clear.  NECK:  Supple, no jugular venous distention. No thyroid enlargement, no tenderness.  LUNGS: Wheezing present in the left lung. Bibasilar rales.no, rhonchi. No use of accessory muscles of respiration.  CARDIOVASCULAR: S1, S2 normal. Ejection systolic  murmur present in aortic area., rubs, or gallops.  ABDOMEN: Soft, nontender, nondistended. Bowel sounds present. No organomegaly or mass.  EXTREMITIES: No cyanosis, clubbing or edema b/l.   Right chronic nonhealling ulcers/infection of the great toe and 5th digit s/p surgical dressing + NEUROLOGIC: Cranial nerves II through XII are intact. No focal Motor or sensory deficits b/l.   PSYCHIATRIC:  patient is alert and oriented x 3.  SKIN: No obvious rash, lesion, or ulcer.   LABORATORY PANEL:  CBC  Recent Labs Lab 02/01/16 0518  WBC 10.3  HGB 10.0*  HCT 30.4*  PLT 134*    Chemistries   Recent Labs Lab 02/01/16 0518  NA 132*  K 4.7  CL 95*  CO2 25  GLUCOSE 300*  BUN 30*  CREATININE 1.76*  CALCIUM 7.9*  MG 2.1   Cardiac Enzymes  Recent Labs Lab 01/30/16 1952  TROPONINI 0.82*   RADIOLOGY:  Nm Pulmonary Perf And Vent  Result Date: 01/30/2016 CLINICAL DATA:  Chest tightness.  Tachycardia. EXAM: NUCLEAR MEDICINE VENTILATION - PERFUSION LUNG SCAN TECHNIQUE: Ventilation images were obtained in multiple projections using inhaled aerosol Tc-7545m DTPA. Perfusion images were obtained in multiple projections after intravenous injection of Tc-5945m MAA. RADIOPHARMACEUTICALS:  32.76 mCi Technetium-5545m DTPA aerosol inhalation and 3.987 mCi Technetium-2445m MAA IV COMPARISON:  Current chest radiograph FINDINGS: Ventilation: No focal ventilation defect. Perfusion: No wedge shaped peripheral perfusion defects to suggest acute pulmonary embolism. IMPRESSION: No evidence of a pulmonary thromboembolism. Electronically Signed   By: Amie Portlandavid  Ormond M.D.   On: 01/30/2016 13:55   Dg Chest Port 1 View  Result Date: 02/01/2016 CLINICAL DATA:  Shortness of breath and nausea EXAM: PORTABLE CHEST 1 VIEW COMPARISON:  January 30, 2016  FINDINGS: There are bilateral pleural effusions with patchy interstitial and alveolar edema throughout the mid and lower lung zones. There is slightly more edema compared to  recent prior study. Heart is enlarged with pulmonary venous hypertension. No adenopathy. There is atherosclerotic calcification in the aorta. No bone lesions. IMPRESSION: Congestive heart failure with increased interstitial and alveolar edema compared to recent study. Aortic atherosclerosis. Electronically Signed   By: Bretta BangWilliam  Woodruff III M.D.   On: 02/01/2016 09:09   ASSESSMENT AND PLAN:  82 year old male history of insulin requiring type 2 diabetes presenting with foot ulcer.  Acute on chronic systolic heart failure/reactive airway disease with wheezing: Continue oxygen, added Solu-Medrol, continue nebulizers, add small dose Lasix.  Hypotension; yesterday due to medications: Stop at the beta blockers, nitrates. He is on Lasix for pulmonary edema    # Right foot osteomyelitis: podiatry consult with Dr Alberteen Spindleline noted -s/p Amputation right great toe with partial first ray resection. Excisional debridement ulceration right fifth metatarsal full-thickness to joint capsule by Dr cline -MRI right foot results noted Nausea ,poor po intake; changed his antibiotic to Cipro, Ancef today with Zofran for nausea.  -# Type 2 diabetes insulin requiring; poor by mouth intake. Using SSI with coverage. Held lantus.Because of poor by mouth intake. # Hypothyroidism unspecified: synthroid  # Essential hypertension: Now hypotensive so hold the Vaseretic.  # DVT prophylaxis Lovneox  # Hypokalemia repleted  Chronic renal failure, CK D stage III: Stable.  D/w pt and RN   Case discussed with Care Management/Social Worker. Management plans discussed with the patient, family and they are in agreement.  CODE STATUS: FULL  DVT Prophylaxis: lovenox  TOTAL CC TIME TAKING CARE OF THIS PATIENT: 35 minutes.   POSSIBLE D/C IN 1-2 DAYS, DEPENDING ON CLINICAL CONDITION.  Katha HammingKONIDENA,Ondra Deboard M.D on 02/01/2016 at 12:01 PM  Between 7am to 6pm - Pager - [phone]  After 6pm go to [email or web address] - password [email or web address]  [facility]  [facility]  Office  [phone]  CC: Primary care physician; Dione Housekeeperlmedo, Mario Ernesto, MD   Note: This dictation was prepared with Dragon dictation along with smaller phrase technology. Any transcriptional errors that result from this process are unintentional.

## 2016-02-01 NOTE — Progress Notes (Signed)
Called to room by CNA stating that pt is complaining of severe chest pains. Pt diaphoretic, BP 99/50 HR 111. Rapid Response called. MD Luberta MutterKonidena notified. Orders received for troponin STAT. X1, EKG, Pts BG 408, orders for 12 units Novolog. Rapid response team at the bedside. MD Fath also paged and notified, MD Letitia LibraJohnston at bedside, MD Fath also at the bedside.

## 2016-02-01 NOTE — Progress Notes (Signed)
Pts BG 253, Pt has scheduled Novolog 10 units, Novolog sliding scale and Lantus 40 units. MD Luberta MutterKonidena notified. Per MD hold Lantus, and 10 Units.

## 2016-02-01 NOTE — Progress Notes (Addendum)
Inpatient Diabetes Program Recommendations  AACE/ADA: New Consensus Statement on Inpatient Glycemic Control (2015)  Target Ranges:  Prepandial:   less than 140 mg/dL      Peak postprandial:   less than 180 mg/dL (1-2 hours)      Critically ill patients:  140 - 180 mg/dL   Results for Jason [name], Jason [name] (MRN [id number]) as of 02/01/2016 09:43  Ref. Range 01/31/2016 07:26 01/31/2016 11:19 01/31/2016 16:27 01/31/2016 21:33  Glucose-Capillary Latest Ref Range: 65 - 99 mg/dL 914352 (H) 782276 (H) 956253 (H) 230 (H)   Results for Jason [name], Jason [name] (MRN [id number]) as of 02/01/2016 09:43  Ref. Range 02/01/2016 07:22  Glucose-Capillary Latest Ref Range: 65 - 99 mg/dL 469319 (H)    Home DM Meds: Lantus 90 units QPM Humalog 20 units TIDWC Humalog SSI  Current Insulin Orders: Lantus 40 units QPM Novolog Moderate Correction Scale/ SSI (0-15 units) TID AC + HS      Novolog 10 units TIDWC      -Per notes, Lantus 40 units not given last evening.  RN spoke with MD and decision made to hold Lantus.  -Expect pt's CBGs to remain elevated today given Lantus was held last PM.  -Note pt having SOB this AM.  Cxray pending.  -Patient also not eating well yesterday.  Novolog Meal Coverage held as a result of pt's poor PO intake.     MD- May want to go ahead and give patient his Lantus this AM.  Would give Lantus 40 units this AM and get Lantus dose back on evening schedule tomorrow (11/16).      --Will follow patient during hospitalization--  Ambrose FinlandJeannine Johnston Armonee Bojanowski RN, MSN, CDE Diabetes Coordinator Inpatient Glycemic Control Team Team Pager: [phone] (8a-5p)

## 2016-02-01 NOTE — Significant Event (Signed)
Rapid Response Event Note  Overview: Time Called: 1535 Arrival Time: 1538 Event Type: Respiratory, Cardiac  Initial Focused Assessment: Patient lying in bed, diaphoretic, SOB, able to respond clearly. POD #4- right great toe amputation. Patient has been nauseated today, poor PO. CBG 408. C/O chest pain 8/10, prior to this arrival 1 SL nitro administered. B/P 96/50   Interventions:STAT EKG ordered, Continue to experience chest pain and tightness- 2nd SL nitro administered. Repeat B/P 108/56. Serial troponins ordered. O2 increased to 4L.  Plan of Care (if not transferred):  Event Summary: Name of Physician Notified: Letitia LibraJohnston at 1540  Name of Consulting Physician Notified: Fath at 1540  Outcome: Transferred (Comment) Dr. Lady GaryFath reviewed EKG, and ordered 2nd EKG for comparison. MD order for patient to transfer to ICU for Heparin and Nitroglycerin infusions. Patient transferred to ICU bed 3 in bed with this RN with O2 applied.     Jason [name]

## 2016-02-01 NOTE — Progress Notes (Signed)
[facility] responded to a Rapid Response. Pt was complaining of chest pains and shortness of breath (pr nurse) Pt was being attended to by team. [facility] Pt wife at Pt request. Wife will arrive when son gets off work. CH provided the ministry of prayer, presence, and hospitality. CH is available for follow up as needed.    02/01/16 1600  Clinical Encounter Type  Visited With Patient;Health care provider  Visit Type Initial;Spiritual support;Code (Rapid Responce)  Referral From Nurse  Spiritual Encounters  Spiritual Needs Prayer;Emotional  Stress Factors  Patient Stress Factors Health changes

## 2016-02-01 NOTE — Progress Notes (Signed)
Pharmacy called and notified of pt's PTT and PT results before heparin infusion. Heparin started at 1100u/hr.

## 2016-02-01 NOTE — Consult Note (Signed)
MEDICATION RELATED CONSULT NOTE - Follow up  Pharmacy Consult for electrolytes Indication: hypokalemia, hypomagnesemia  No Known Allergies  Patient Measurements: Height: 5\' 8"  (172.7 cm) Weight: 259 lb 12.8 oz (117.8 kg) IBW/kg (Calculated) : 68.4  Vital Signs: Temp: 97.6 F (36.4 C) (11/15 0730) Temp Source: Oral (11/15 0730) BP: 125/56 (11/15 1044) Pulse Rate: 108 (11/15 0730) Intake/Output from previous day: No intake/output data recorded. Intake/Output from this shift: No intake/output data recorded.  Labs:  Recent Labs  01/30/16 0448 01/31/16 0328 02/01/16 0518  WBC  --   --  10.3  HGB  --   --  10.0*  HCT  --   --  30.4*  PLT  --   --  134*  CREATININE 1.63* 1.72* 1.76*  MG 1.9 2.0 2.1  PHOS 2.7  --   --    Estimated Creatinine Clearance: 41.1 mL/min (by C-G formula based on SCr of 1.76 mg/dL (H)).   Assessment: Pt is a 82 year old male with osteomyelitis of the foot here for amuptation of toe. Found to have hypokalemia and hypomagnesemia.  K 4.7, Mag 2.1  Goal of Therapy:  Electrolytes within normal limits  Plan:  Electrolyte WNL x3 days. No additional supplementation needed today.  Will sign off from this consult as K and mag within range for three days.    Marty HeckWang, Alassane Kalafut L, PharmD, BCPS Clinical Pharmacist 02/01/2016,10:54 AM

## 2016-02-01 NOTE — Progress Notes (Signed)
CN wanted order to be placed

## 2016-02-01 NOTE — Progress Notes (Signed)
PT Cancellation Note  Patient Details Name: Almon HerculesMyron L Forman MRN: [id number] DOB: 02-Apr-1932   Cancelled Treatment:    Reason Eval/Treat Not Completed: Patient declined, no reason specified. Pt refuses PT today noting he has been nauseated/sick all day with inability to eat anything. Re attempt treatment at a later date.    Scot DockHeidi E Barnes, PTA 02/01/2016, 2:26 PM

## 2016-02-01 NOTE — Progress Notes (Signed)
Pt experienced respiratory distress. [facility] 10[facility] Per Ana vital signs are not needed at 1200.

## 2016-02-01 NOTE — Progress Notes (Signed)
ANTICOAGULATION CONSULT NOTE - Initial Consult  Pharmacy Consult for heparin drip  Indication: chest pain/ACS  No Known Allergies  Patient Measurements: Height: 5\' 8"  (172.7 cm) Weight: 259 lb 12.8 oz (117.8 kg) IBW/kg (Calculated) : 68.4 Heparin Dosing Weight: 95kg  Vital Signs: Temp: 98.3 F (36.8 C) (11/15 1652) Temp Source: Oral (11/15 1652) BP: 108/56 (11/15 1610) Pulse Rate: 105 (11/15 1610)  Labs:  Recent Labs  01/30/16 0448 01/30/16 0854 01/30/16 1539 01/30/16 1952 01/31/16 0328 02/01/16 0518 02/01/16 1226  HGB  --   --   --   --   --  10.0* 10.3*  HCT  --   --   --   --   --  30.4* 31.0*  PLT  --   --   --   --   --  134* 135*  CREATININE 1.63*  --   --   --  1.72* 1.76*  --   TROPONINI  --  0.88* 1.02* 0.82*  --   --   --     Estimated Creatinine Clearance: 41.1 mL/min (by C-G formula based on SCr of 1.76 mg/dL (H)).   Medical History: Past Medical History:  Diagnosis Date  . BPH (benign prostatic hyperplasia)   . Constipation   . COPD (chronic obstructive pulmonary disease) (HCC)   . Diabetes mellitus without complication (HCC)   . GERD (gastroesophageal reflux disease)   . HTN (hypertension)   . Hypothyroidism   . Stroke [facility](HCC)    Assessment: 82 yo male with chest pain. Patient received Enoxaparin 40mg  x1 on 11/14 at 2100.   Goal of Therapy:  Heparin level 0.3-0.7 units/ml Monitor platelets by anticoagulation protocol: Yes   Plan:  Heparin dosing weight: 95kg Baseline aPTT, PT/INR, and HL ordered.  Will only bolus Heparin 2000 units since patient received enoxaparin.  <24 hours ago.  Will start infusion at Heparin 1100 units/hr Heparin level ordered in 8 hours.  Need to monitor plts closely, 11/15 plts: 135  Gardner CandleSheema M Tajha Sammarco, PharmD Clinical Pharmacist  02/01/2016,4:56 PM

## 2016-02-01 NOTE — Progress Notes (Signed)
Called to pts room. Pt states he is having "chest Pain" and is gasping for air and is visibly diaphoretic. Marland Kitchen. Pt on 3.5 liters , o2 92%  BP 143/77, HR 119 resp 35, pain 8/10.  ntg sl given. ekg ordered. md notified.  cbg 408

## 2016-02-01 NOTE — Progress Notes (Signed)
[facility]  SUBJECTIVE:Increasing chest pain and sob  Vitals:   02/01/16 1539 02/01/16 1540 02/01/16 1600 02/01/16 1610  BP: (!) 99/50 (!) 108/52 (!) 96/50 (!) 108/56  Pulse: (!) 111   (!) 105  Resp: (!) 35     Temp:      TempSrc:      SpO2: 93%     Weight:      Height:        Intake/Output Summary (Last 24 hours) at 02/01/16 1633 Last data filed at 02/01/16 1145  Gross per 24 hour  Intake                0 ml  Output              100 ml  Net             -100 ml    LABS: Basic Metabolic Panel:  Recent Labs  40/98/1111/13/17 0448 01/31/16 0328 02/01/16 0518  NA 131* 132* 132*  K 4.3 4.6 4.7  CL 95* 95* 95*  CO2 28 28 25   GLUCOSE 325* 325* 300*  BUN 19 24* 30*  CREATININE 1.63* 1.72* 1.76*  CALCIUM 7.3* 7.6* 7.9*  MG 1.9 2.0 2.1  PHOS 2.7  --   --    Liver Function Tests: No results for input(s): AST, ALT, ALKPHOS, BILITOT, PROT, ALBUMIN in the last 72 hours. No results for input(s): LIPASE, AMYLASE in the last 72 hours. CBC:  Recent Labs  02/01/16 0518 02/01/16 1226  WBC 10.3 10.0  HGB 10.0* 10.3*  HCT 30.4* 31.0*  MCV 86.8 85.5  PLT 134* 135*   Cardiac Enzymes:  Recent Labs  01/30/16 0854 01/30/16 1539 01/30/16 1952  TROPONINI 0.88* 1.02* 0.82*   BNP: Invalid input(s): POCBNP D-Dimer: No results for input(s): DDIMER in the last 72 hours. Hemoglobin A1C: No results for input(s): HGBA1C in the last 72 hours. Fasting Lipid Panel: No results for input(s): CHOL, HDL, LDLCALC, TRIG, CHOLHDL, LDLDIRECT in the last 72 hours. Thyroid Function Tests: No results for input(s): TSH, T4TOTAL, T3FREE, THYROIDAB in the last 72 hours.  Invalid input(s): FREET3 Anemia Panel: No results for input(s): VITAMINB12, FOLATE, FERRITIN, TIBC, IRON, RETICCTPCT in the last 72 hours.   Physical Exam: Blood pressure (!) 108/56, pulse (!) 105, temperature 98.6 F (37 C), temperature source Oral, resp. rate (!) 35, height 5\' 8"  (1.727  m), weight 117.8 kg (259 lb 12.8 oz), SpO2 93 %.   Wt Readings from Last 1 Encounters:  01/25/16 117.8 kg (259 lb 12.8 oz)     General appearance: alert and cooperative Resp: rales bilaterally Cardio: regular rate and rhythm GI: soft, non-tender; bowel sounds normal; no masses,  no organomegaly Pulses: 2+ and symmetric  TELEMETRY: Reviewed telemetry pt in sr/st:  ASSESSMENT AND PLAN:  Pt with recurrent midsternal chest pain this afternoon.  Pain has been intermitant over the day but worse and more persistant this afternoon. EKG showed deeper t wave inversion in the high lateral leads. Pain improved with sl ntg. Will transfer to icu and place in ov heparin and iv ntg. Will carefully hydrate in preperation for consideration for cardiac catheterization in am. If symptoms worsen or ekg changes, will consider urgent cath prior to am.   Dalia HeadingKenneth A Fath, MD, [facility] 02/01/2016 4:33 PM

## 2016-02-01 NOTE — Progress Notes (Signed)
[facility]

## 2016-02-01 NOTE — Progress Notes (Signed)
[facility] aware [facility] no [facility] today.   Baker Hughes IncorporatedBailey Jakeem Grape, LCSW [phone]

## 2016-02-01 NOTE — Progress Notes (Addendum)
Patient ID: Jason [name], male   DOB: 03/23/1932, 82 y.o.   MRN: [id number]  Called to see patient as part of rapid response. Patient was having chest pain. Patient says he's been having these on and off. With the onset of this chest pain he had diaphoresis and some mild shortness of breath. He's been given 2 sublingual nitroglycerin tablets. Pain has improved. However he did have changes on his EKG that was done during time of pain. Dr. Lady GaryFath who has seen the patient earlier in his hospital stay for evaluation was called. Decision was made to transfer him to the ICU and place him on nitroglycerin, IV heparin and give him gentle IV hydration. Patient will likely need heart catheterization but that is yet to be determined.  1. Acute coronary syndrome. Again patient in transfer to the ICU with above medications.Patient being evaluated by cardiology for potential  cardiac catheterization. We'll cycle troponins.  Total time 30 minutes.

## 2016-02-02 ENCOUNTER — Inpatient Hospital Stay: Payer: Medicare Other

## 2016-02-02 ENCOUNTER — Encounter
Admission: AD | Disposition: A | Discharge status: Skilled Nursing Facility | Payer: Self-pay | Source: Ambulatory Visit | Attending: Internal Medicine

## 2016-02-02 DIAGNOSIS — I214 Non-ST elevation (NSTEMI) myocardial infarction: Secondary | ICD-10-CM

## 2016-02-02 LAB — CBC
HEMATOCRIT: 31.4 % — AB (ref 40.0–52.0)
HEMOGLOBIN: 10.2 g/dL — AB (ref 13.0–18.0)
MCH: 27.9 pg (ref 26.0–34.0)
MCHC: 32.6 g/dL (ref 32.0–36.0)
MCV: 85.6 fL (ref 80.0–100.0)
Platelets: 176 10*3/uL (ref 150–440)
RBC: 3.67 MIL/uL — AB (ref 4.40–5.90)
RDW: 15.1 % — ABNORMAL HIGH (ref 11.5–14.5)
WBC: 14.3 10*3/uL — ABNORMAL HIGH (ref 3.8–10.6)

## 2016-02-02 LAB — TROPONIN I: TROPONIN I: 1.13 ng/mL — AB (ref <0.03)

## 2016-02-02 LAB — AEROBIC/ANAEROBIC CULTURE (SURGICAL/DEEP WOUND)

## 2016-02-02 LAB — GLUCOSE, CAPILLARY
GLUCOSE-CAPILLARY: 347 mg/dL — AB (ref 65–99)
GLUCOSE-CAPILLARY: 242 mg/dL — AB (ref 65–99)
Glucose-Capillary: 322 mg/dL — ABNORMAL HIGH (ref 65–99)

## 2016-02-02 LAB — AEROBIC/ANAEROBIC CULTURE W GRAM STAIN (SURGICAL/DEEP WOUND)

## 2016-02-02 LAB — HEPARIN LEVEL (UNFRACTIONATED)
Heparin Unfractionated: <0.1 IU/mL — ABNORMAL LOW (ref 0.30–0.70)
Heparin Unfractionated: <0.1 IU/mL — ABNORMAL LOW (ref 0.30–0.70)

## 2016-02-02 SURGERY — INVASIVE LAB ABORTED CASE

## 2016-02-02 MED ORDER — ONDANSETRON HCL 4 MG/2ML IJ SOLN: INTRAMUSCULAR | Status: DC | PRN

## 2016-02-02 MED ORDER — HEPARIN BOLUS VIA INFUSION
2900.0000 [IU] | Freq: Once | INTRAVENOUS | Status: AC
  Administered 2016-02-02: 2900 [IU] via INTRAVENOUS
  Filled 2016-02-02: qty 2900

## 2016-02-02 MED ORDER — FUROSEMIDE 10 MG/ML IJ SOLN
40.0000 mg | Freq: Once | INTRAMUSCULAR | Status: AC
  Administered 2016-02-02: 40 mg via INTRAVENOUS

## 2016-02-02 MED ORDER — ONDANSETRON HCL 4 MG/2ML IJ SOLN
INTRAMUSCULAR | Status: AC
Start: 2016-02-02 — End: 2016-02-02
  Filled 2016-02-02: qty 2

## 2016-02-02 MED ORDER — SODIUM CHLORIDE 0.9 % IV SOLN: 250.0000 mL | INTRAVENOUS | 0 refills | Status: DC | PRN

## 2016-02-02 MED ORDER — FUROSEMIDE 10 MG/ML IJ SOLN: 40.0000 mg | Freq: Once | INTRAMUSCULAR | Status: DC

## 2016-02-02 MED ORDER — HEPARIN BOLUS VIA INFUSION
3000.0000 [IU] | Freq: Once | INTRAVENOUS | Status: AC
  Administered 2016-02-02: 3000 [IU] via INTRAVENOUS
  Filled 2016-02-02: qty 3000

## 2016-02-02 MED ORDER — ONDANSETRON HCL 4 MG/2ML IJ SOLN
4.0000 mg | Freq: Once | INTRAMUSCULAR | Status: AC
  Administered 2016-02-02: 4 mg via INTRAVENOUS

## 2016-02-02 MED ORDER — FUROSEMIDE 10 MG/ML IJ SOLN
INTRAMUSCULAR | Status: AC
  Filled 2016-02-02: qty 4

## 2016-02-02 MED ORDER — PREDNISONE 20 MG PO TABS: 50.0000 mg | ORAL_TABLET | Freq: Every day | ORAL | Status: DC

## 2016-02-02 MED ORDER — SODIUM CHLORIDE 0.9% FLUSH
10.0000 mL | INTRAVENOUS | Status: DC | PRN
  Administered 2016-02-06 – 2016-02-09 (×3): 10 mL
  Filled 2016-02-02 (×3): qty 40

## 2016-02-02 MED ORDER — LEVALBUTEROL HCL 0.63 MG/3ML IN NEBU
0.6300 mg | INHALATION_SOLUTION | RESPIRATORY_TRACT | Status: AC
  Administered 2016-02-02: 0.63 mg via RESPIRATORY_TRACT
  Filled 2016-02-02 (×2): qty 3

## 2016-02-02 MED ORDER — HEPARIN (PORCINE) IN NACL 2-0.9 UNIT/ML-% IJ SOLN
INTRAMUSCULAR | Status: AC
  Filled 2016-02-02: qty 500

## 2016-02-02 MED ORDER — FENTANYL CITRATE (PF) 100 MCG/2ML IJ SOLN
INTRAMUSCULAR | Status: AC
  Filled 2016-02-02: qty 2

## 2016-02-02 MED ORDER — SODIUM CHLORIDE 0.9% FLUSH
10.0000 mL | Freq: Two times a day (BID) | INTRAVENOUS | Status: DC
Start: 2016-02-02 — End: 2016-02-14
  Administered 2016-02-02 (×2): 10 mL
  Administered 2016-02-03: 30 mL
  Administered 2016-02-04: 10 mL
  Administered 2016-02-04 – 2016-02-05 (×2): 20 mL
  Administered 2016-02-05 – 2016-02-06 (×3): 10 mL
  Administered 2016-02-07 (×2): 20 mL
  Administered 2016-02-08 – 2016-02-14 (×13): 10 mL

## 2016-02-02 MED ORDER — MIDAZOLAM HCL 2 MG/2ML IJ SOLN
INTRAMUSCULAR | Status: AC
  Filled 2016-02-02: qty 2

## 2016-02-02 SURGICAL SUPPLY — 7 items
CATH 5FR JL4 DIAGNOSTIC (CATHETERS) ×4 IMPLANT
CATH 5FR JR4 DIAGNOSTIC (CATHETERS) ×3 IMPLANT
GUIDEWIRE 3MM J TIP .035 145 (WIRE) ×4 IMPLANT
KIT MANI 3VAL PERCEP (MISCELLANEOUS) ×4 IMPLANT
NEEDLE PERC 18GX7CM (NEEDLE) ×4 IMPLANT
PACK CARDIAC CATH (CUSTOM PROCEDURE TRAY) ×4 IMPLANT
SHEATH AVANTI 5FR X 11CM (SHEATH) ×4 IMPLANT

## 2016-02-02 NOTE — Progress Notes (Signed)
PT Cancellation Note  Patient Details Name: Jason [name] MRN: [id number] DOB: [birth year]   Cancelled Treatment:    Reason Eval/Treat Not Completed: Medical issues which prohibited therapy Pt with rapid response called, transfered CCU.  Given decline in medical status pt will need new orders to continue PT.  Current orders will be completed, re-consult when appropriate.  Malachi ProGalen R Serayah Yazdani 02/02/2016, 8:13 AM

## 2016-02-02 NOTE — Progress Notes (Signed)
[facility]  SUBJECTIVE: Increasing sob   Vitals:   02/02/16 0700 02/02/16 0735 02/02/16 0805 02/02/16 0940  BP: 117/72  115/68 121/68  Pulse: (!) 102 (!) 103 (!) 105 (!) 106  Resp: (!) 21 17 20  (!) 30  Temp:  97.5 F (36.4 C) 98 F (36.7 C)   TempSrc:  Oral Oral   SpO2: 96% 97% 92% 92%  Weight:   117.5 kg (259 lb)   Height:   5\' 8"  (1.727 m)     Intake/Output Summary (Last 24 hours) at 02/02/16 1002 Last data filed at 02/02/16 0725  Gross per 24 hour  Intake          1808.06 ml  Output              100 ml  Net          1708.06 ml    LABS: Basic Metabolic Panel:  Recent Labs  16/12/9609/14/17 0328 02/01/16 0518 02/01/16 1722  NA 132* 132* 129*  K 4.6 4.7 4.9  CL 95* 95* 93*  CO2 28 25 25   GLUCOSE 325* 300* 412*  BUN 24* 30* 36*  CREATININE 1.72* 1.76* 1.97*  CALCIUM 7.6* 7.9* 8.0*  MG 2.0 2.1  --    Liver Function Tests: No results for input(s): AST, ALT, ALKPHOS, BILITOT, PROT, ALBUMIN in the last 72 hours. No results for input(s): LIPASE, AMYLASE in the last 72 hours. CBC:  Recent Labs  02/01/16 1226 02/01/16 1722  WBC 10.0 10.1  HGB 10.3* 10.4*  HCT 31.0* 31.0*  MCV 85.5 84.9  PLT 135* 150   Cardiac Enzymes:  Recent Labs  02/01/16 1722 02/01/16 2243 02/02/16 0409  TROPONINI 1.08* 1.19* 1.13*   BNP: Invalid input(s): POCBNP D-Dimer: No results for input(s): DDIMER in the last 72 hours. Hemoglobin A1C: No results for input(s): HGBA1C in the last 72 hours. Fasting Lipid Panel: No results for input(s): CHOL, HDL, LDLCALC, TRIG, CHOLHDL, LDLDIRECT in the last 72 hours. Thyroid Function Tests: No results for input(s): TSH, T4TOTAL, T3FREE, THYROIDAB in the last 72 hours.  Invalid input(s): FREET3 Anemia Panel: No results for input(s): VITAMINB12, FOLATE, FERRITIN, TIBC, IRON, RETICCTPCT in the last 72 hours.   Physical Exam: Blood pressure 121/68, pulse (!) 106, temperature 98 F (36.7 C), temperature  source Oral, resp. rate (!) 30, height 5\' 8"  (1.727 m), weight 117.5 kg (259 lb), SpO2 92 %.   Wt Readings from Last 1 Encounters:  02/02/16 117.5 kg (259 lb)     General appearance: alert and cooperative Resp: rales bilaterally Cardio: regular rate and rhythm GI: soft, non-tender; bowel sounds normal; no masses,  no organomegaly Extremities: edema 3-4+ edema Neurologic: Grossly normal  TELEMETRY: Reviewed telemetry pt in sinus tach  ASSESSMENT AND PLAN:  Active Problems:   Osteomyelitis of foot, right, acute (HCC)-continue abx   Elevated troponin I level-nstemi. Had bump in troponin with abnormal ekg with pain. Attempted to proceed with cardiac cath this am but unable to lay flat with hypoxia. Will diurese although renal function is worsened. Will ask for intensivist help and defer cath until stable.     Dalia HeadingKenneth A Rennie Rouch, MD, [facility] Ltd[facility]ACC 02/02/2016 10:02 AM

## 2016-02-02 NOTE — Progress Notes (Signed)
ANTICOAGULATION CONSULT NOTE - Initial Consult  Pharmacy Consult for heparin drip  Indication: chest pain/ACS  No Known Allergies  Patient Measurements: Height: 5\' 8"  (172.7 cm) Weight: 259 lb 11.2 oz (117.8 kg) IBW/kg (Calculated) : 68.4 Heparin Dosing Weight: 95kg  Vital Signs: Temp: 98.4 F (36.9 C) (11/16 0000) Temp Source: Oral (11/16 0000) BP: 126/66 (11/16 0300) Pulse Rate: 104 (11/16 0300)  Labs:  Recent Labs  01/31/16 0328  02/01/16 0518 02/01/16 1226 02/01/16 1625 02/01/16 1722 02/01/16 2243 02/02/16 0220  HGB  --   < > 10.0* 10.3*  --  10.4*  --   --   HCT  --   --  30.4* 31.0*  --  31.0*  --   --   PLT  --   --  134* 135*  --  150  --   --   APTT  --   --   --   --   --  41*  --   --   LABPROT  --   --   --   --   --  13.4  --   --   INR  --   --   --   --   --  1.02  --   --   HEPARINUNFRC  --   --   --   --   --  <0.10*  --  <0.10*  CREATININE 1.72*  --  1.76*  --   --  1.97*  --   --   TROPONINI  --   --   --   --  1.09* 1.08* 1.19*  --   < > = values in this interval not displayed.  Estimated Creatinine Clearance: 36.7 mL/min (by C-G formula based on SCr of 1.97 mg/dL (H)).   Medical History: Past Medical History:  Diagnosis Date  . BPH (benign prostatic hyperplasia)   . Constipation   . COPD (chronic obstructive pulmonary disease) (HCC)   . Diabetes mellitus without complication (HCC)   . GERD (gastroesophageal reflux disease)   . HTN (hypertension)   . Hypothyroidism   . Stroke [facility](HCC)    Assessment: 82 yo male with chest pain. Patient received Enoxaparin 40mg  x1 on 11/14 at 2100.   Goal of Therapy:  Heparin level 0.3-0.7 units/ml Monitor platelets by anticoagulation protocol: Yes   Plan:  Heparin dosing weight: 95kg Baseline aPTT, PT/INR, and HL ordered.  Will only bolus Heparin 2000 units since patient received enoxaparin.  <24 hours ago.  Will start infusion at Heparin 1100 units/hr Heparin level ordered in 8 hours.  Need to  monitor plts closely, 11/15 plts: 135  11/16 02:30 heparin level <0.1. 2900 unit bolus and increase rate to 1450 units/he. Recheck in 8 hours.  Erich MontaneMcBane,Akylah Hascall S, PharmD Clinical Pharmacist  02/02/2016,3:45 AM

## 2016-02-02 NOTE — Progress Notes (Signed)
Per Dr Lady GaryFath he will review patient's BUN and creatinine early in the AM, if acceptable he will attempt cardiac cath again tomorrow.  Order NPO after midnight.

## 2016-02-02 NOTE — Progress Notes (Signed)
Ok to order PICC for patient per Dr Luberta MutterKonidena, for long term ABX therapy/limited vascular access

## 2016-02-02 NOTE — Care Management (Signed)
Patient transferred to icu 11/15 due to chest pain and EKG changes.  Troponin 1.19 and 1.13.  He was to have had a cardiac cath but unable to lay flat.

## 2016-02-02 NOTE — Progress Notes (Signed)
Inpatient Diabetes Program Recommendations  AACE/ADA: New Consensus Statement on Inpatient Glycemic Control (2015)  Target Ranges:  Prepandial:   less than 140 mg/dL      Peak postprandial:   less than 180 mg/dL (1-2 hours)      Critically ill patients:  140 - 180 mg/dL   Lab Results  Component Value Date   GLUCAP 322 (H) 02/02/2016   HGBA1C 6.7 (H) 01/25/2016    Review of Glycemic Control  Results for Almon HerculesMARTIN, Nikholas L (MRN [id number]) as of 02/02/2016 09:57  Ref. Range 02/01/2016 11:36 02/01/2016 15:38 02/01/2016 16:53 02/01/2016 22:31 02/02/2016 07:04  Glucose-Capillary Latest Ref Range: 65 - 99 mg/dL 829347 (H) 562408 (H) 130396 (H) 292 (H) 322 (H)   Home DM Meds: Lantus 90 units QPM Humalog 20 units TIDWC Humalog SSI  Current Insulin Orders: Lantus 40units QPM Novolog Moderate Correction Scale/ SSI (0-15 units) TID AC + HS                                       Novolog 10 units TIDWC  Consider placing the patient on IV insulin using the the IV Insulin/Glucostabilizer order set  (ICU Glycemic Control order set if the patient is vented)- this is the safest way to decrease blood sugars.   Susette RacerJulie Humberto Addo, RN, BA, MHA, CDE Diabetes Coordinator Inpatient Diabetes Program  [phone] (Team Pager) [phone] [facility] Office) 02/02/2016 10:01 AM

## 2016-02-02 NOTE — Progress Notes (Signed)
[facility] [place] [facility]   PATIENT NAME: Jason [name]    MR#:  [id number]  DATE OF BIRTH:  Oct 13, 1932  Transferred to ICU yesterday because of non-ST elevation mi. Heart is on heparin drip, nitro drip. Patient was taken to cardiac catheter this morning but he could not lie down flat because of shortness of breath, hypoxia so cardiac cath not done because of that.e, transferred back to ICU,  Stopped   IV fluids, restarted the heparin , nitro drip.   Came in with ongoing infection of the right foot,s/p right great toe amputation. POD #3   REVIEW OF SYSTEMS:   Review of Systems  Constitutional: Negative for chills, fever and weight loss.  HENT: Negative for ear discharge, ear pain and nosebleeds.   Eyes: Negative for blurred vision, pain and discharge.  Respiratory: Negative for sputum production, shortness of breath, wheezing and stridor.   Cardiovascular: Negative for chest pain, palpitations, orthopnea and PND.  Gastrointestinal: Negative for abdominal pain, diarrhea, nausea and vomiting.  Genitourinary: Negative for frequency and urgency.  Musculoskeletal: Positive for joint pain. Negative for back pain.  Neurological: Positive for weakness. Negative for sensory change, speech change and focal weakness.  Psychiatric/Behavioral: Negative for depression and hallucinations. The patient is not nervous/anxious.    Tolerating Diet:yes Tolerating PT: pending  DRUG ALLERGIES:  No Known Allergies  VITALS:  Blood pressure 110/66, pulse (!) 102, temperature 97.5 F (36.4 C), temperature source Oral, resp. rate (!) 21, height 5\' 8"  (1.727 m), weight 117.5 kg (259 lb), SpO2 95 %.  PHYSICAL EXAMINATION:   Physical Exam  GENERAL:  82 y.o.-year-old patient lying in the bed with no acute distress.Denies any chest pain. EYES: Pupils equal, round, reactive to light and accommodation. No scleral icterus. Extraocular muscles intact.  HEENT: Head atraumatic,  normocephalic. Oropharynx and nasopharynx clear.  NECK:  Supple, no jugular venous distention. No thyroid enlargement, no tenderness.  LUNGS: Wheezing present in the left lung. Bibasilar rales.no, rhonchi. No use of accessory muscles of respiration.  CARDIOVASCULAR: S1, S2 normal. Ejection systolic murmur present in aortic area., rubs, or gallops.  ABDOMEN: Soft, nontender, nondistended. Bowel sounds present. No organomegaly or mass.  EXTREMITIES: No cyanosis, clubbing or edema b/l.   Right chronic nonhealling ulcers/infection of the great toe and 5th digit s/p surgical dressing + NEUROLOGIC: Cranial nerves II through XII are intact. No focal Motor or sensory deficits b/l.   PSYCHIATRIC:  patient is alert and oriented x 3.  SKIN: No obvious rash, lesion, or ulcer.   LABORATORY PANEL:  CBC  Recent Labs Lab 02/02/16 1150  WBC 14.3*  HGB 10.2*  HCT 31.4*  PLT 176    Chemistries   Recent Labs Lab 02/01/16 0518 02/01/16 1722  NA 132* 129*  K 4.7 4.9  CL 95* 93*  CO2 25 25  GLUCOSE 300* 412*  BUN 30* 36*  CREATININE 1.76* 1.97*  CALCIUM 7.9* 8.0*  MG 2.1  --    Cardiac Enzymes  Recent Labs Lab 02/02/16 0409  TROPONINI 1.13*   RADIOLOGY:  Dg Chest Port 1 View  Result Date: 02/02/2016 CLINICAL DATA:  PICC line placement. Pulmonary edema and bilateral pleural effusions. EXAM: PORTABLE CHEST 1 VIEW COMPARISON:  02/02/2016 FINDINGS: New left arm PICC line is seen with tip overlying the distal SVC. Cardiomegaly is stable.  Aortic atherosclerosis. Diffuse interstitial and airspace disease shows no significant change, consistent with diffuse pulmonary edema. Small layering bilateral pleural effusions are also stable. IMPRESSION:  New left arm PICC line in appropriate position. Stable diffuse pulmonary edema and small bilateral pleural effusions. Stable cardiomegaly. Electronically Signed   By: Myles RosenthalJohn  Stahl M.D.   On: 02/02/2016 12:52   Dg Chest Port 1 View  Result Date:  02/02/2016 CLINICAL DATA:  Respiratory failure. Hx of COPD, diabetes, hypertension, stroke. Former smoker EXAM: PORTABLE CHEST 1 VIEW COMPARISON:  02/01/2016 FINDINGS: Bibasilar airspace opacities. The perihilar opacities have mildly improved. Interstitial accentuation with indistinct pulmonary vasculature. Blunting of the costophrenic angle suggesting a component of pleural effusions bilaterally. Mild enlargement of the cardiopericardial silhouette. Atherosclerotic aortic arch. Degenerative left glenohumeral findings. IMPRESSION: 1. Appearance favoring congestive heart failure, minimal reduction in the perihilar airspace opacities compared to yesterday's exam. Suspected bilateral pleural effusions with residual interstitial edema and mild basilar airspace edema. Electronically Signed   By: Gaylyn RongWalter  Liebkemann M.D.   On: 02/02/2016 11:24   Dg Chest Port 1 View  Result Date: 02/01/2016 CLINICAL DATA:  Shortness of breath and nausea EXAM: PORTABLE CHEST 1 VIEW COMPARISON:  January 30, 2016 FINDINGS: There are bilateral pleural effusions with patchy interstitial and alveolar edema throughout the mid and lower lung zones. There is slightly more edema compared to recent prior study. Heart is enlarged with pulmonary venous hypertension. No adenopathy. There is atherosclerotic calcification in the aorta. No bone lesions. IMPRESSION: Congestive heart failure with increased interstitial and alveolar edema compared to recent study. Aortic atherosclerosis. Electronically Signed   By: Bretta BangWilliam  Woodruff III M.D.   On: 02/01/2016 09:09   ASSESSMENT AND PLAN:  82 year old male history of insulin requiring type 2 diabetes presenting with foot ulcer. Chest pain, non st elevation MI: On aspirin,, nitrates, heparin drip  , patient had flash pulmonary edema because of the fluids given  in preparation for cardiac catheter, patient did not have a cardiac catheter because of flash pulmonary edema, orthopnea. No one now 6 L of  oxygen. Cardiology is following, patient is chest pain-free.  Acute on chronic systolic heart failure/reactive airway disease with wheezing: Continue oxygen, added Solu-Medrol, continue nebulizers, add small dose Lasix.    # Right foot osteomyelitis: podiatry consult with Dr Alberteen Spindleline noted -s/p Amputation right great toe with partial first ray resection. Excisional debridement ulceration right fifth metatarsal full-thickness to joint capsule by Dr cline -MRI right foot results noted Nausea ,poor po intake; changed his antibiotic to Cipro, Ancef today with Zofran for nausea.  -# Type 2 diabetes insulin requiring; . Using SSI with coverage. Lantus.  # Hypothyroidism unspecified: synthroid  # Essential hypertension:  # DVT prophylaxis;heparin drip # Hypokalemia repleted  Chronic renal failure, CK D stage III: Stable.  D/w pt and RN   Case discussed with Care Management/Social Worker. Management plans discussed with the patient, family and they are in agreement.  CODE STATUS: FULL  DVT Prophylaxis: lovenox  TOTAL CC TIME TAKING CARE OF THIS PATIENT: 35 minutes.   POSSIBLE D/C IN 1-2 DAYS, DEPENDING ON CLINICAL CONDITION.  Katha HammingKONIDENA,Rendon Howell M.D on 02/02/2016 at 1:45 PM  Between 7am to 6pm - Pager - [phone]  After 6pm go to [email or web address] - password [email or web address] [facility]  [facility]  Office  [phone]  CC: Primary care physician; Dione Housekeeperlmedo, Mario Ernesto, MD   Note: This dictation was prepared with Dragon dictation along with smaller phrase technology. Any transcriptional errors that result from this process are unintentional.

## 2016-02-02 NOTE — Progress Notes (Signed)
After blood pressure redone (91/45) Patient head of bed lowered. BP came back to 102/61 after 10 minutes of being lower.

## 2016-02-02 NOTE — Progress Notes (Signed)
ANTICOAGULATION CONSULT NOTE - Initial Consult  Pharmacy Consult for heparin drip  Indication: chest pain/ACS  No Known Allergies  Patient Measurements: Height: 5\' 8"  (172.7 cm) Weight: 259 lb (117.5 kg) IBW/kg (Calculated) : 68.4 Heparin Dosing Weight: 95kg  Vital Signs: Temp: 97.5 F (36.4 C) (11/16 1200) Temp Source: Oral (11/16 1200) BP: 102/61 (11/16 1530) Pulse Rate: 105 (11/16 1530)  Labs:  Recent Labs  01/31/16 0328  02/01/16 0518 02/01/16 1226  02/01/16 1722 02/01/16 2243 02/02/16 0220 02/02/16 0409 02/02/16 1150  HGB  --   < > 10.0* 10.3*  --  10.4*  --   --   --  10.2*  HCT  --   < > 30.4* 31.0*  --  31.0*  --   --   --  31.4*  PLT  --   < > 134* 135*  --  150  --   --   --  176  APTT  --   --   --   --   --  41*  --   --   --   --   LABPROT  --   --   --   --   --  13.4  --   --   --   --   INR  --   --   --   --   --  1.02  --   --   --   --   HEPARINUNFRC  --   --   --   --   --  <0.10*  --  <0.10*  --  <0.10*  CREATININE 1.72*  --  1.76*  --   --  1.97*  --   --   --   --   TROPONINI  --   --   --   --   < > 1.08* 1.19*  --  1.13*  --   < > = values in this interval not displayed.  Estimated Creatinine Clearance: 36.6 mL/min (by C-G formula based on SCr of 1.97 mg/dL (H)).   Medical History: Past Medical History:  Diagnosis Date  . BPH (benign prostatic hyperplasia)   . Constipation   . COPD (chronic obstructive pulmonary disease) (HCC)   . Diabetes mellitus without complication (HCC)   . GERD (gastroesophageal reflux disease)   . HTN (hypertension)   . Hypothyroidism   . Stroke [facility](HCC)    Assessment: 82 y/o M with a h/o PVD s/p revascularization admitted for osteomyelitis now s/p amputation of right great toe. Patient with NSTEMI unable to tolerate cath due to inability to lie flat. Heparin drip not paused prior to going to cath lab and still running when patient returned to unit per RN.   Goal of Therapy:  Heparin level 0.3-0.7  units/ml Monitor platelets by anticoagulation protocol: Yes   Plan:  Bolus heparin 3000 units iv once and increase heparin infusion to 1800 units/hr. Will recheck a HL in 8 hours.   Valentina Guhristy, Korin Setzler D, PharmD Clinical Pharmacist  02/02/2016,3:50 PM

## 2016-02-02 NOTE — Consult Note (Signed)
PULMONARY / CRITICAL CARE MEDICINE   Name: Jason [name] MRN: [id number] DOB: [birth year]    ADMISSION DATE:  01/25/2016 CONSULTATION DATE:  02/02/2016  REFERRING MD:  Dr. Bartholome Bill  CHIEF COMPLAINT:  Shortness of breath  HISTORY OF PRESENT ILLNESS:   Jason [name] is a 82 y.o. Male with a PMH of COPD, Former Smoker, BPH, constipation, Type 2 DM requiring insulin with complication of neuropathy, GERD, HTN, Hypothyroidism, Stroke.  On 01/25/16 Jason [name] was seen in the podiatry clinic for right foot ulcer, and was referred to [facility] ED for further workup and management.  On 01/28/16, pt underwent right toe amputation and irrigation and debridement of the right foot performed by Dr. Cleda Mccreedy at [facility].  On 02/01/16 pt had complaint of chest pain, diaphoresis, and SOB, with EKG changes (deeper T wave inversion in the high lateral leads) and positive troponins.  Pt was transferred to ICU for initiation of nitroglycerin and heparin gtts.  Dr. Ubaldo Glassing examined pt and recommended cardiac catheterization scheduled 02/02/16.  In the cath lab 02/02/16, pt developed acute hypoxic respiratory distress while lying supine for procedure prep.  Therefore, cardiac cath procedure was aborted,  STAT CXR revealed pulmonary edema, and pt was given 40 mg IV lasix x1 dose. Post lasix administration pt diuresed 300 ml urine.  PCCM consulted 11/16 for further management of acute hypoxic respiratory failure secondary to pulmonary edema.  PAST MEDICAL HISTORY :  He  has a past medical history of BPH (benign prostatic hyperplasia); Constipation; COPD (chronic obstructive pulmonary disease) ([place]); Diabetes mellitus without complication ([place]); GERD (gastroesophageal reflux disease); HTN (hypertension); Hypothyroidism; and Stroke [facility]).  PAST SURGICAL HISTORY: He  has a past surgical history that includes Rotator cuff repair (Left); Thyroid surgery; Cardiac catheterization (Right, 01/10/2016); Cardiac catheterization (N/A, 01/10/2016);  Cardiac catheterization (01/10/2016); Amputation toe (Right, 01/28/2016); and Irrigation and debridement foot (Right, 01/28/2016).  No Known Allergies  No current facility-administered medications on file prior to encounter.    Current Outpatient Prescriptions on File Prior to Encounter  Medication Sig  . albuterol (PROVENTIL HFA;VENTOLIN HFA) 108 (90 Base) MCG/ACT inhaler Inhale 2 puffs into the lungs.   Marland Kitchen aspirin 81 MG tablet Take 81 mg by mouth daily.  . clopidogrel (PLAVIX) 75 MG tablet Take 1 tablet (75 mg total) by mouth daily.  Marland Kitchen FLUoxetine (PROZAC) 40 MG capsule Take 40 mg by mouth daily.  . insulin lispro (HUMALOG) 100 UNIT/ML injection Inject 20 Units into the skin. 0-150=0 151-200=2unit 201-250=4units 251-300=6 units 301-350=8 units > 351= 10 units & recheck in 2 hours  . LANTUS SOLOSTAR 100 UNIT/ML Solostar Pen Inject 90 Units into the skin daily. subQ at 1630 with dinner  . simvastatin (ZOCOR) 20 MG tablet Take 20 mg by mouth at bedtime.  Marland Kitchen SYNTHROID 125 MCG tablet Take 125 mcg by mouth daily.  . tamsulosin (FLOMAX) 0.4 MG CAPS capsule Take 0.4 mg by mouth daily.  . blood glucose meter kit and supplies KIT Dispense based on patient and insurance preference. Use up to four times daily as directed. (FOR ICD-9 250.00, 250.01).  Marland Kitchen oxyCODONE (OXY IR/ROXICODONE) 5 MG immediate release tablet Take 1 tablet (5 mg total) by mouth every 4 (four) hours as needed for moderate pain.    FAMILY HISTORY:  His indicated that the status of his mother is unknown. He indicated that the status of his father is unknown.    SOCIAL HISTORY: He  reports that he has quit smoking. He has never used smokeless tobacco. He  reports that he does not drink alcohol or use drugs.  REVIEW OF SYSTEMS:  Positives in BOLD Gen: Denies fever, chills, weight change, fatigue, night sweats HEENT: Denies blurred vision, double vision, hearing loss, tinnitus, sinus congestion, rhinorrhea, sore throat, neck  stiffness, dysphagia PULM: shortness of breath, cough, sputum production, hemoptysis, wheezing CV: chest pain, edema, orthopnea, paroxysmal nocturnal dyspnea, palpitations GI: Denies abdominal pain, nausea, vomiting, diarrhea, hematochezia, melena, constipation, change in bowel habits GU: Denies dysuria, hematuria, polyuria, oliguria, urethral discharge Endocrine: Denies hot or cold intolerance, polyuria, polyphagia or appetite change Derm: Denies rash, dry skin, scaling or peeling skin change Heme: Denies easy bruising, bleeding, bleeding gums Neuro: Denies headache, numbness, weakness, slurred speech, loss of memory or consciousness  SUBJECTIVE:  Shortness of breath has improved since receiving Lasix no other complaints at this time  VITAL SIGNS: BP 121/68   Pulse (!) 106   Temp 98 F (36.7 C) (Oral)   Resp (!) 30   Ht 5' 8"  (1.727 m)   Wt 259 lb (117.5 kg)   SpO2 92%   BMI 39.38 kg/m   HEMODYNAMICS:    VENTILATOR SETTINGS:    INTAKE / OUTPUT: I/O last 3 completed shifts: In: 1445.9 [P.O.:200; I.V.:1145.9; IV Piggyback:100] Out: 100 [Urine:100]  PHYSICAL EXAMINATION: General:  Acutely ill appearing caucasian male, lying in bed currently in no distress Neuro:  Alert and oriented x4, follows simple commands HEENT:  Neck supple, No JVD Cardiovascular:  Regular rate, sinus tachycardia on telemetry, faint murmur no rubs or gallops , 1+ Generalized edema Lungs:  Crackles bilateral lung bases, No accessory muscle use, symmetrical expansion Abdomen:  Obese, soft, non-distended, non-tender, Bowel sounds present in all 4 quadrants Musculoskeletal:  Normal bulk and tone Skin:  No rashes, lesions, or ulcerations. Scattered ecchymosis throughout  LABS:  BMET  Recent Labs Lab 01/31/16 0328 02/01/16 0518 02/01/16 1722  NA 132* 132* 129*  K 4.6 4.7 4.9  CL 95* 95* 93*  CO2 28 25 25   BUN 24* 30* 36*  CREATININE 1.72* 1.76* 1.97*  GLUCOSE 325* 300* 412*     Electrolytes  Recent Labs Lab 01/28/16 0322  01/29/16 0118 01/30/16 0448 01/31/16 0328 02/01/16 0518 02/01/16 1722  CALCIUM  --   < > 7.1* 7.3* 7.6* 7.9* 8.0*  MG 1.7  --  1.6* 1.9 2.0 2.1  --   PHOS 2.5  --  2.9 2.7  --   --   --   < > = values in this interval not displayed.  CBC  Recent Labs Lab 02/01/16 0518 02/01/16 1226 02/01/16 1722  WBC 10.3 10.0 10.1  HGB 10.0* 10.3* 10.4*  HCT 30.4* 31.0* 31.0*  PLT 134* 135* 150    Coag's  Recent Labs Lab 02/01/16 1722  APTT 41*  INR 1.02    Sepsis Markers No results for input(s): LATICACIDVEN, PROCALCITON, O2SATVEN in the last 168 hours.  ABG No results for input(s): PHART, PCO2ART, PO2ART in the last 168 hours.  Liver Enzymes No results for input(s): AST, ALT, ALKPHOS, BILITOT, ALBUMIN in the last 168 hours.  Cardiac Enzymes  Recent Labs Lab 02/01/16 1722 02/01/16 2243 02/02/16 0409  TROPONINI 1.08* 1.19* 1.13*    Glucose  Recent Labs Lab 02/01/16 0722 02/01/16 1136 02/01/16 1538 02/01/16 1653 02/01/16 2231 02/02/16 0704  GLUCAP 319* 347* 408* 396* 292* 322*    Imaging No results found.   STUDIES:  01/26/16 MRI Right Foot>> . Soft tissue ulceration over the first MTP joint. Cortical disruption of  the dorsal cortex along the base of the first proximal phalanx with marrow edema most concerning for osteomyelitis. Marrow edema in the first distal phalanx and first metatarsal head without definite cortical destruction may reflect reactive marrow edema versus early osteomyelitis. Severe tendinosis of the distal Achilles tendon 01/30/16 NM Pulmonary Vent and Perf.>> No evidence of a pulmonary thromboembolism. 01/30/16 Transthoracic Echocardiogram>>EF 45% to 50% 02/01/16 CXR>> Congestive heart failure with increased interstitial and alveolar edema compared to recent study. Aortic atherosclerosis   CULTURES: 01/28/16 Wound right great toe>> Proteus Vulgaris  ANTIBIOTICS: Ciprofloxacin  11/15>> Ampicillin-Sulbactam 11/16>>  SIGNIFICANT EVENTS: 11/8>> Admission to [facility] 11/11>> Right toe amputation and right foot debridement 11/15>> Acute MI, transferred to ICU for heparin and nitroglycerin gtt 11/16>> Cardiac catheterization aborted due to development of acute respiratory distress 11/16>> PCCM consulted for further management of acute respiratory distress  LINES/TUBES: None  ASSESSMENT / PLAN:  PULMONARY A: Acute hypoxic respiratory failure secondary to pulmonary edema Hx: COPD, Former smoker P:  STAT CXR post lasix administration-11/16 Cautious with IV lasix administration due to worsening creatinine and hx of  CKD Stage III If creatinine worsens and pt needs further diureses will consider Nephrology Consult Maintain O2 sats greater than 92% Supplemental O2 to maintain O2 sats or for dyspnea Bipap prn Initiate IV Nitroglycerin gtt for preload reduction CXR in am 02/03/16  Marda Stalker, [place] Pager [phone] (please enter 7 digits) PCCM Consult Pager [phone] (please enter 7 digits)  PCCM ATTENDING ATTESTATION: I have evaluated patient with the APP [place], reviewed database in its entirety and discussed care plan in detail. In addition, this patient was discussed on multidisciplinary rounds.   Pt admitted for amputation. Suffered NSTEMI and AKI. Planned LHC this AM aborted due to inability to lie flat and respiratory distress. Exam and CXR are consistent with pulmonary edema. He denies chest discomfort/pain.   Acute hypoxic respiratory failure Pulmonary edema NSTEMI AKI/CKD DM2 Peripheral vasc disease, s/p amputuation  PLAN/REC: ICU/SDU montioring Supplemental O2 PRN BiPAP Diuresis as permitted by renal function NTG gtt to reduce preload Cont heparin and ASA   Merton Border, MD PCCM service Mobile [phone] Pager [phone]

## 2016-02-02 NOTE — Progress Notes (Signed)
Jason Harmanana NP informed of patients lowering blood pressure. Patient is asymptomatic with map of 2860. Orders received to stop Nitro drip to see if BP improves. Will continue to assess.

## 2016-02-02 NOTE — Progress Notes (Signed)
1449 Nitro drip stopped

## 2016-02-03 ENCOUNTER — Inpatient Hospital Stay: Payer: Medicare Other

## 2016-02-03 DIAGNOSIS — J9601 Acute respiratory failure with hypoxia: Secondary | ICD-10-CM

## 2016-02-03 DIAGNOSIS — N179 Acute kidney failure, unspecified: Secondary | ICD-10-CM

## 2016-02-03 DIAGNOSIS — J810 Acute pulmonary edema: Secondary | ICD-10-CM

## 2016-02-03 LAB — CBC
HCT: 29.8 % — ABNORMAL LOW (ref 40.0–52.0)
Hemoglobin: 10 g/dL — ABNORMAL LOW (ref 13.0–18.0)
MCH: 28.5 pg (ref 26.0–34.0)
MCHC: 33.4 g/dL (ref 32.0–36.0)
MCV: 85.4 fL (ref 80.0–100.0)
PLATELETS: 157 10*3/uL (ref 150–440)
RBC: 3.49 MIL/uL — ABNORMAL LOW (ref 4.40–5.90)
RDW: 14.6 % — AB (ref 11.5–14.5)
WBC: 10.9 10*3/uL — ABNORMAL HIGH (ref 3.8–10.6)

## 2016-02-03 LAB — GLUCOSE, CAPILLARY
GLUCOSE-CAPILLARY: 227 mg/dL — AB (ref 65–99)
GLUCOSE-CAPILLARY: 329 mg/dL — AB (ref 65–99)
Glucose-Capillary: 290 mg/dL — ABNORMAL HIGH (ref 65–99)
Glucose-Capillary: 277 mg/dL — ABNORMAL HIGH (ref 65–99)
Glucose-Capillary: 268 mg/dL — ABNORMAL HIGH (ref 65–99)

## 2016-02-03 LAB — BASIC METABOLIC PANEL
Anion gap: 7 (ref 5–15)
BUN: 39 mg/dL — AB (ref 6–20)
CHLORIDE: 94 mmol/L — AB (ref 101–111)
CO2: 29 mmol/L (ref 22–32)
CREATININE: 1.55 mg/dL — AB (ref 0.61–1.24)
Calcium: 8 mg/dL — ABNORMAL LOW (ref 8.9–10.3)
GFR calc Af Amer: 47 mL/min — ABNORMAL LOW (ref >60)
GFR calc non Af Amer: 40 mL/min — ABNORMAL LOW (ref >60)
GLUCOSE: 282 mg/dL — AB (ref 65–99)
Potassium: 4.2 mmol/L (ref 3.5–5.1)
Sodium: 130 mmol/L — ABNORMAL LOW (ref 135–145)

## 2016-02-03 LAB — HEPARIN LEVEL (UNFRACTIONATED)
HEPARIN UNFRACTIONATED: 0.47 [IU]/mL (ref 0.30–0.70)
Heparin Unfractionated: 0.39 IU/mL (ref 0.30–0.70)

## 2016-02-03 MED ORDER — NITROGLYCERIN 2 % TD OINT
1.0000 [in_us] | TOPICAL_OINTMENT | Freq: Four times a day (QID) | TRANSDERMAL | Status: DC
  Administered 2016-02-03 – 2016-02-07 (×14): 1 [in_us] via TOPICAL
  Filled 2016-02-03 (×15): qty 1

## 2016-02-03 MED ORDER — IPRATROPIUM-ALBUTEROL 0.5-2.5 (3) MG/3ML IN SOLN
3.0000 mL | Freq: Four times a day (QID) | RESPIRATORY_TRACT | Status: DC | PRN
  Administered 2016-02-03 – 2016-02-12 (×7): 3 mL via RESPIRATORY_TRACT
  Filled 2016-02-03 (×8): qty 3

## 2016-02-03 MED ORDER — FUROSEMIDE 10 MG/ML IJ SOLN
40.0000 mg | Freq: Two times a day (BID) | INTRAMUSCULAR | Status: DC
  Administered 2016-02-03: 40 mg via INTRAVENOUS
  Filled 2016-02-03: qty 4

## 2016-02-03 MED ORDER — CIPROFLOXACIN HCL 500 MG PO TABS
500.0000 mg | ORAL_TABLET | Freq: Two times a day (BID) | ORAL | Status: DC
  Administered 2016-02-03 – 2016-02-08 (×11): 500 mg via ORAL
  Filled 2016-02-03 (×12): qty 1

## 2016-02-03 MED ORDER — FUROSEMIDE 10 MG/ML IJ SOLN
40.0000 mg | Freq: Once | INTRAMUSCULAR | Status: AC
  Administered 2016-02-03: 40 mg via INTRAVENOUS
  Filled 2016-02-03: qty 4

## 2016-02-03 MED ORDER — ENOXAPARIN SODIUM 40 MG/0.4ML ~~LOC~~ SOLN
40.0000 mg | SUBCUTANEOUS | Status: DC
  Administered 2016-02-03 – 2016-02-06 (×4): 40 mg via SUBCUTANEOUS
  Filled 2016-02-03 (×4): qty 0.4

## 2016-02-03 NOTE — Progress Notes (Signed)
1 Day Post-Op  Subjective: Patient seen. No complaints of pain with the foot. Feeling some better overall.  Objective: Vital signs in last 24 hours: Temp:  [97.7 F (36.5 C)-98.6 F (37 C)] 97.7 F (36.5 C) (11/17 0800) Pulse Rate:  [88-110] 88 (11/17 1000) Resp:  [17-26] 17 (11/17 1000) BP: (84-127)/(43-98) 127/73 (11/17 1000) SpO2:  [86 %-98 %] 94 % (11/17 1000) Last BM Date: 02/02/16  Intake/Output from previous day: 11/16 0701 - 11/17 0700 In: 1899 [P.O.:480; I.V.:1019; IV Piggyback:400] Out: 1455 [Urine:1455] Intake/Output this shift: Total I/O In: 102 [I.V.:102] Out: -   The bandages dry and intact on the right foot. Upon removal some mild continued bleeding from the great toe amputation site. No significant purulence is noted. Some continued necrosis of the great toe skin flap as well as some starting just proximal to the debridement area along the lateral fifth metatarsal head area. No significant cellulitis  Lab Results:   Recent Labs  02/02/16 1150 02/03/16 0332  WBC 14.3* 10.9*  HGB 10.2* 10.0*  HCT 31.4* 29.8*  PLT 176 157   BMET  Recent Labs  02/01/16 1722 02/03/16 0332  NA 129* 130*  K 4.9 4.2  CL 93* 94*  CO2 25 29  GLUCOSE 412* 282*  BUN 36* 39*  CREATININE 1.97* 1.55*  CALCIUM 8.0* 8.0*   PT/INR  Recent Labs  02/01/16 1722  LABPROT 13.4  INR 1.02   ABG No results for input(s): PHART, HCO3 in the last 72 hours.  Invalid input(s): PCO2, PO2  Studies/Results: Dg Chest Port 1 View  Result Date: 02/03/2016 CLINICAL DATA:  Acute onset of respiratory failure. Initial encounter. EXAM: PORTABLE CHEST 1 VIEW COMPARISON:  Chest radiograph performed 02/02/2016 FINDINGS: The lungs are well-aerated. Vascular congestion is noted. Bilateral central and bibasilar airspace opacities may reflect pulmonary edema or pneumonia. Small bilateral pleural effusions are seen. This is mildly worsened from the prior study. No pneumothorax is seen. The  cardiomediastinal silhouette is is borderline enlarged. No acute osseous abnormalities are seen. A left PICC is noted ending about the mid SVC. IMPRESSION: Vascular congestion and borderline cardiomegaly. Bilateral central and bibasilar airspace opacities may reflect pulmonary edema or pneumonia. Small bilateral pleural effusions seen. Airspace opacification is mildly worsened from the prior study. Electronically Signed   By: Roanna RaiderJeffery  Chang M.D.   On: 02/03/2016 03:34   Dg Chest Port 1 View  Result Date: 02/02/2016 CLINICAL DATA:  PICC line placement. Pulmonary edema and bilateral pleural effusions. EXAM: PORTABLE CHEST 1 VIEW COMPARISON:  02/02/2016 FINDINGS: New left arm PICC line is seen with tip overlying the distal SVC. Cardiomegaly is stable.  Aortic atherosclerosis. Diffuse interstitial and airspace disease shows no significant change, consistent with diffuse pulmonary edema. Small layering bilateral pleural effusions are also stable. IMPRESSION: New left arm PICC line in appropriate position. Stable diffuse pulmonary edema and small bilateral pleural effusions. Stable cardiomegaly. Electronically Signed   By: Myles RosenthalJohn  Stahl M.D.   On: 02/02/2016 12:52   Dg Chest Port 1 View  Result Date: 02/02/2016 CLINICAL DATA:  Respiratory failure. Hx of COPD, diabetes, hypertension, stroke. Former smoker EXAM: PORTABLE CHEST 1 VIEW COMPARISON:  02/01/2016 FINDINGS: Bibasilar airspace opacities. The perihilar opacities have mildly improved. Interstitial accentuation with indistinct pulmonary vasculature. Blunting of the costophrenic angle suggesting a component of pleural effusions bilaterally. Mild enlargement of the cardiopericardial silhouette. Atherosclerotic aortic arch. Degenerative left glenohumeral findings. IMPRESSION: 1. Appearance favoring congestive heart failure, minimal reduction in the perihilar airspace opacities compared  to yesterday's exam. Suspected bilateral pleural effusions with residual  interstitial edema and mild basilar airspace edema. Electronically Signed   By: Gaylyn RongWalter  Liebkemann M.D.   On: 02/02/2016 11:24    Anti-infectives: Anti-infectives    Start     Dose/Rate Route Frequency Ordered Stop   02/03/16 1015  ciprofloxacin (CIPRO) tablet 500 mg     500 mg Oral 2 times daily 02/03/16 1007     02/01/16 1600  Ampicillin-Sulbactam (UNASYN) 3 g in sodium chloride 0.9 % 100 mL IVPB  Status:  Discontinued     3 g 200 mL/hr over 30 Minutes Intravenous Every 6 hours 02/01/16 1520 02/02/16 1158   02/01/16 1000  ciprofloxacin (CIPRO) IVPB 400 mg  Status:  Discontinued     400 mg 200 mL/hr over 60 Minutes Intravenous Every 12 hours 02/01/16 0926 02/03/16 1007   02/01/16 1000  ceFAZolin (ANCEF) IVPB 1 g/50 mL premix  Status:  Discontinued     1 g 100 mL/hr over 30 Minutes Intravenous Every 8 hours 02/01/16 0926 02/01/16 1520   01/29/16 1330  ciprofloxacin (CIPRO) tablet 500 mg  Status:  Discontinued     500 mg Oral 2 times daily 01/29/16 1131 02/01/16 0926   01/29/16 1330  amoxicillin-clavulanate (AUGMENTIN) 875-125 MG per tablet 1 tablet  Status:  Discontinued     1 tablet Oral Every 12 hours 01/29/16 1131 02/01/16 0926   01/29/16 0800  vancomycin (VANCOCIN) 1,250 mg in sodium chloride 0.9 % 250 mL IVPB  Status:  Discontinued     1,250 mg 166.7 mL/hr over 90 Minutes Intravenous Every 24 hours 01/29/16 0203 01/29/16 1131   01/26/16 0600  piperacillin-tazobactam (ZOSYN) IVPB 3.375 g  Status:  Discontinued     3.375 g 12.5 mL/hr over 240 Minutes Intravenous Every 8 hours 01/25/16 2009 01/29/16 1131   01/26/16 0200  vancomycin (VANCOCIN) 1,500 mg in sodium chloride 0.9 % 500 mL IVPB  Status:  Discontinued     1,500 mg 250 mL/hr over 120 Minutes Intravenous Every 24 hours 01/25/16 2008 01/29/16 0200   01/25/16 2200  piperacillin-tazobactam (ZOSYN) IVPB 3.375 g  Status:  Discontinued     3.375 g 12.5 mL/hr over 240 Minutes Intravenous Every 8 hours 01/25/16 1751 01/25/16 1753    01/25/16 1830  vancomycin (VANCOCIN) IVPB 1000 mg/200 mL premix     1,000 mg 200 mL/hr over 60 Minutes Intravenous  Once 01/25/16 1752 01/25/16 2105   01/25/16 1830  piperacillin-tazobactam (ZOSYN) IVPB 3.375 g     3.375 g 12.5 mL/hr over 240 Minutes Intravenous  Once 01/25/16 1753 01/26/16 0005   01/25/16 1800  vancomycin (VANCOCIN) IVPB 1000 mg/200 mL premix  Status:  Discontinued     1,000 mg 200 mL/hr over 60 Minutes Intravenous Every 24 hours 01/25/16 1751 01/25/16 1753      Assessment/Plan: s/p Procedure(s): INVASIVE LAB ABORTED/CANCEL CASE Assessment: Status post debridement and amputation right foot, some mild progressive necrosis and demarcation.  Plan: Sterile dressing reapplied to the right foot. At this point continue with dressing changes every 2 or 3 days. Discussed with the patient that we may need to revise the surgical sites at some point in the future. We'll follow on Monday.  LOS: 9 days    Ricci Barkerodd W Shawneen Deetz 02/03/2016

## 2016-02-03 NOTE — Progress Notes (Signed)
Inpatient Diabetes Program Recommendations  AACE/ADA: New Consensus Statement on Inpatient Glycemic Control (2015)  Target Ranges:  Prepandial:   less than 140 mg/dL      Peak postprandial:   less than 180 mg/dL (1-2 hours)      Critically ill patients:  140 - 180 mg/dL   Lab Results  Component Value Date   GLUCAP 277 (H) 02/03/2016   HGBA1C 6.7 (H) 01/25/2016    Review of Glycemic Control  Results for Jason [name], Jason [name] (MRN [id number]) as of 02/03/2016 09:09  Ref. Range 02/01/2016 22:31 02/02/2016 07:04 02/02/2016 16:23 02/02/2016 21:18 02/03/2016 07:17  Glucose-Capillary Latest Ref Range: 65 - 99 mg/dL 347292 (H) 425322 (H) 956347 (H) 242 (H) 277 (H)    Home DM Meds: Lantus 90 units QPM Humalog 20 units TIDWC Humalog SSI  Current Insulin Orders: Lantus 40units QPM Novolog Moderate Correction Scale/ SSI (0-15 units) TID AC + HS   Patient currently NPO- if he is going to remain NPO, please consider changing Novolog to q4h  Susette RacerJulie Dynver Clemson, RN, [place], [place]HA, CDE Diabetes Coordinator Inpatient Glycemic Control Team [phone] (Team Pager) [phone] [facility] Office) 02/03/2016 9:16 AM

## 2016-02-03 NOTE — Progress Notes (Signed)
Pt has remained alert and oriented with no c/o pain. RR even with abdominal breathing. Expiratory wheezes noted periodically to left upper lobe-PRN duoneb given x1. Pt weaned to [facility]2LNC from [facility]4LNC; however, pt appeared to be pulling and sating 89%-bumped up to 3LNC-currently sating 95%. ST in 1-teens on cardiac monitor. BP WNL. 1800 nitropaste held d/t low diastolic at 5 and MAP 67.  Pt c/o nausea-Zofran given x1. Pt has not eat hardly at all today-per wife pt has been like this for awhile. Dietary consulted. SW/PT consulted d/t wife's concern of SNF placement and inquiry to take pt home. Wife was very displeased with [facility].  Pt generally feels ill and has slept most of the day on and off-requesting not be turned.

## 2016-02-03 NOTE — Progress Notes (Signed)
Nutrition Follow-up  DOCUMENTATION CODES:   Severe malnutrition in context of acute illness/injury  INTERVENTION:  -Will discontinue Glucerna shakes as pt does not like, will add Carnation Instant Breakfast in between meals instead; pt is agreeable to this. Pt also agreeable to trying Magic Cup, will send on meal trays -Recommend liberalizing diet to Regular until appetite improves -Recommend smaller, more frequent meals. Will send snacks   NUTRITION DIAGNOSIS:   Malnutrition related to acute illness as evidenced by energy intake < or equal to 50% for > or equal to 5 days, percent weight loss.  Being addressed via supplements, snacks, liberalizing diet  GOAL:   Patient will meet greater than or equal to 90% of their needs  MONITOR:   PO intake, Supplement acceptance, I & O's, Labs, Weight trends  REASON FOR ASSESSMENT:   Malnutrition Screening Tool    ASSESSMENT:   Pt s/p right great toe amputation. Transferred to ICU with flash pulmonary edema post op  Pt with very poor appetite, did not eat anything today. Recorded po intake 20% of meals on average. Pt does not like the Glucerna shakes, reports too sweet. Pt reports no problems chewing or swallowing. No N/V. Pt very weak.   Diet Order:  Diet regular Room service appropriate? Yes; Fluid consistency: Thin  Skin:  Wound (see comment) (osteomyelitis of R foot)  Last BM:  11/16  Height:   Ht Readings from Last 1 Encounters:  02/02/16 5\' 8"  (1.727 m)    Weight:   Wt Readings from Last 1 Encounters:  02/02/16 259 lb (117.5 kg)    Ideal Body Weight:  70 kg  BMI:  Body mass index is 39.38 kg/m.  Estimated Nutritional Needs:   Kcal:  2000-2450 calories (25-30 cal/kg ABW)  Protein:  117-140 gm  Fluid:  >/= 2L  EDUCATION NEEDS:   No education needs identified at this time  Romelle StarcherCate Aldean Suddeth MS, RD, LDN [phone] Pager  [phone] Weekend/On-Call Pager

## 2016-02-03 NOTE — Progress Notes (Addendum)
No distress. Has not required BiPAP. Remains on O2 @ 4 lpm [place]. Denies CP  Vitals:   02/03/16 0300 02/03/16 0400 02/03/16 0500 02/03/16 0600  BP: 108/62 (!) 112/57 117/60 115/61  Pulse: (!) 102 100 (!) 102 (!) 101  Resp: (!) 23 (!) 21 (!) 22 (!) 21  Temp:  98.6 F (37 C)    TempSrc:  Axillary    SpO2: 98% 96% 96% 95%  Weight:      Height:       NAD HEENT WNL JVP cannot be visualized No wheezes, bronchial BS in B base Reg, no M Obese, NT, +BS Ext warm, no edema  BMP Latest Ref Rng & Units 02/03/2016 02/01/2016 02/01/2016  Glucose 65 - 99 mg/dL 161(W282(H) 960(A412(H) 540(J300(H)  BUN 6 - 20 mg/dL 81(X39(H) 91(Y36(H) 78(G30(H)  Creatinine 0.61 - 1.24 mg/dL 9.56(O1.55(H) 1.30(Q1.97(H) 6.57(Q1.76(H)  Sodium 135 - 145 mmol/L 130(L) 129(L) 132(L)  Potassium 3.5 - 5.1 mmol/L 4.2 4.9 4.7  Chloride 101 - 111 mmol/L 94(L) 93(L) 95(L)  CO2 22 - 32 mmol/L 29 25 25   Calcium 8.9 - 10.3 mg/dL 8.0(L) 8.0(L) 7.9(L)   CBC Latest Ref Rng & Units 02/03/2016 02/02/2016 02/01/2016  WBC 3.8 - 10.6 K/uL 10.9(H) 14.3(H) 10.1  Hemoglobin 13.0 - 18.0 g/dL 10.0(L) 10.2(L) 10.4(L)  Hematocrit 40.0 - 52.0 % 29.8(L) 31.4(L) 31.0(L)  Platelets 150 - 440 K/uL 157 176 150   CXR: CM, edema pattern, probable bilateral effusions  IMPRESSION: Acute hypoxic respiratory failure Pulmonary edema AKI/CKD - creatinine improving DM2 - inadequately controlled PVD, s/p R foot amputation  PLAN/REC: Repeat Lasix today Continue supplemental O2 Lantus increased today Continue IV heparin, ASA Cont Cipro (proteus in foot wound)  Transfer to telemetry today. PCCM will sign off. Please call if we can be of further assistance  Billy Fischeravid Simonds, MD PCCM service Mobile [phone] Pager [phone] 02/03/2016

## 2016-02-03 NOTE — Progress Notes (Signed)
ANTICOAGULATION CONSULT NOTE - Initial Consult  Pharmacy Consult for heparin drip  Indication: chest pain/ACS  No Known Allergies  Patient Measurements: Height: 5\' 8"  (172.7 cm) Weight: 259 lb (117.5 kg) IBW/kg (Calculated) : 68.4 Heparin Dosing Weight: 95kg  Vital Signs: Temp: 98.2 F (36.8 C) (11/17 0000) Temp Source: Axillary (11/17 0000) BP: 119/53 (11/17 0000) Pulse Rate: 102 (11/17 0000)  Labs:  Recent Labs  01/31/16 0328  02/01/16 0518 02/01/16 1226  02/01/16 1722 02/01/16 2243 02/02/16 0220 02/02/16 0409 02/02/16 1150 02/02/16 2346  HGB  --   < > 10.0* 10.3*  --  10.4*  --   --   --  10.2*  --   HCT  --   < > 30.4* 31.0*  --  31.0*  --   --   --  31.4*  --   PLT  --   < > 134* 135*  --  150  --   --   --  176  --   APTT  --   --   --   --   --  41*  --   --   --   --   --   LABPROT  --   --   --   --   --  13.4  --   --   --   --   --   INR  --   --   --   --   --  1.02  --   --   --   --   --   HEPARINUNFRC  --   --   --   --   < > <0.10*  --  <0.10*  --  <0.10* 0.39  CREATININE 1.72*  --  1.76*  --   --  1.97*  --   --   --   --   --   TROPONINI  --   --   --   --   < > 1.08* 1.19*  --  1.13*  --   --   < > = values in this interval not displayed.  Estimated Creatinine Clearance: 36.6 mL/min (by C-G formula based on SCr of 1.97 mg/dL (H)).   Medical History: Past Medical History:  Diagnosis Date  . BPH (benign prostatic hyperplasia)   . Constipation   . COPD (chronic obstructive pulmonary disease) (HCC)   . Diabetes mellitus without complication (HCC)   . GERD (gastroesophageal reflux disease)   . HTN (hypertension)   . Hypothyroidism   . Stroke [facility](HCC)    Assessment: 82 y/o M with a h/o PVD s/p revascularization admitted for osteomyelitis now s/p amputation of right great toe. Patient with NSTEMI unable to tolerate cath due to inability to lie flat. Heparin drip not paused prior to going to cath lab and still running when patient returned to unit  per RN.   Goal of Therapy:  Heparin level 0.3-0.7 units/ml Monitor platelets by anticoagulation protocol: Yes   Plan:  Bolus heparin 3000 units iv once and increase heparin infusion to 1800 units/hr. Will recheck a HL in 8 hours.   11/16 23:45 heparin level 0.39. Continue current regimen. Recheck in 8 hours to confirm.   Erich MontaneMcBane,Arvell Pulsifer S, PharmD Clinical Pharmacist  02/03/2016,1:36 AM

## 2016-02-03 NOTE — Progress Notes (Signed)
[facility] [place] [facility]   PATIENT NAME: Jason SchneidersMyron [name]    MR#:  [id number]  DATE OF BIRTH:  [birth year]   he denies any chest pain.  Off nitro ,drip, heparin drip.Kidney function is better.still has nausea and poor po intake.   Came in with ongoing infection of the right foot,s/p right great toe amputation. POD #3   REVIEW OF SYSTEMS:   Review of Systems  Constitutional: Negative for chills, fever and weight loss.  HENT: Negative for ear discharge, ear pain and nosebleeds.   Eyes: Negative for blurred vision, pain and discharge.  Respiratory: Negative for sputum production, shortness of breath, wheezing and stridor.   Cardiovascular: Negative for chest pain, palpitations, orthopnea and PND.  Gastrointestinal: Negative for abdominal pain, diarrhea, nausea and vomiting.  Genitourinary: Negative for frequency and urgency.  Musculoskeletal: Positive for joint pain. Negative for back pain.  Neurological: Positive for weakness. Negative for sensory change, speech change and focal weakness.  Psychiatric/Behavioral: Negative for depression and hallucinations. The patient is not nervous/anxious.    Tolerating Diet:yes Tolerating PT: pending  DRUG ALLERGIES:  No Known Allergies  VITALS:  Blood pressure 127/73, pulse 88, temperature 97.7 F (36.5 C), temperature source Oral, resp. rate 17, height 5\' 8"  (1.727 m), weight 117.5 kg (259 lb), SpO2 94 %.  PHYSICAL EXAMINATION:   Physical Exam  GENERAL:  82 y.o.-year-old patient lying in the bed with no acute distress.Denies any chest pain. EYES: Pupils equal, round, reactive to light and accommodation. No scleral icterus. Extraocular muscles intact.  HEENT: Head atraumatic, normocephalic. Oropharynx and nasopharynx clear.  NECK:  Supple, no jugular venous distention. No thyroid enlargement, no tenderness.  LUNGS: decreased at bases. CARDIOVASCULAR: S1, S2 normal. Ejection systolic murmur present in  aortic area., rubs, or gallops.  ABDOMEN: Soft, nontender, nondistended. Bowel sounds present. No organomegaly or mass.  EXTREMITIES: No cyanosis, clubbing or edema b/l.   Right chronic nonhealling ulcers/infection of the great toe and 5th digit s/p surgical dressing + NEUROLOGIC: Cranial nerves II through XII are intact. No focal Motor or sensory deficits b/l.   PSYCHIATRIC:  patient is alert and oriented x 3.  SKIN: No obvious rash, lesion, or ulcer.   LABORATORY PANEL:  CBC  Recent Labs Lab 02/03/16 0332  WBC 10.9*  HGB 10.0*  HCT 29.8*  PLT 157    Chemistries   Recent Labs Lab 02/01/16 0518  02/03/16 0332  NA 132*  < > 130*  K 4.7  < > 4.2  CL 95*  < > 94*  CO2 25  < > 29  GLUCOSE 300*  < > 282*  BUN 30*  < > 39*  CREATININE 1.76*  < > 1.55*  CALCIUM 7.9*  < > 8.0*  MG 2.1  --   --   < > = values in this interval not displayed. Cardiac Enzymes  Recent Labs Lab 02/02/16 0409  TROPONINI 1.13*   RADIOLOGY:  Dg Chest Port 1 View  Result Date: 02/03/2016 CLINICAL DATA:  Acute onset of respiratory failure. Initial encounter. EXAM: PORTABLE CHEST 1 VIEW COMPARISON:  Chest radiograph performed 02/02/2016 FINDINGS: The lungs are well-aerated. Vascular congestion is noted. Bilateral central and bibasilar airspace opacities may reflect pulmonary edema or pneumonia. Small bilateral pleural effusions are seen. This is mildly worsened from the prior study. No pneumothorax is seen. The cardiomediastinal silhouette is is borderline enlarged. No acute osseous abnormalities are seen. A left PICC is noted ending about the mid SVC.  IMPRESSION: Vascular congestion and borderline cardiomegaly. Bilateral central and bibasilar airspace opacities may reflect pulmonary edema or pneumonia. Small bilateral pleural effusions seen. Airspace opacification is mildly worsened from the prior study. Electronically Signed   By: Roanna RaiderJeffery  Chang M.D.   On: 02/03/2016 03:34   Dg Chest Port 1  View  Result Date: 02/02/2016 CLINICAL DATA:  PICC line placement. Pulmonary edema and bilateral pleural effusions. EXAM: PORTABLE CHEST 1 VIEW COMPARISON:  02/02/2016 FINDINGS: New left arm PICC line is seen with tip overlying the distal SVC. Cardiomegaly is stable.  Aortic atherosclerosis. Diffuse interstitial and airspace disease shows no significant change, consistent with diffuse pulmonary edema. Small layering bilateral pleural effusions are also stable. IMPRESSION: New left arm PICC line in appropriate position. Stable diffuse pulmonary edema and small bilateral pleural effusions. Stable cardiomegaly. Electronically Signed   By: Myles RosenthalJohn  Stahl M.D.   On: 02/02/2016 12:52   Dg Chest Port 1 View  Result Date: 02/02/2016 CLINICAL DATA:  Respiratory failure. Hx of COPD, diabetes, hypertension, stroke. Former smoker EXAM: PORTABLE CHEST 1 VIEW COMPARISON:  02/01/2016 FINDINGS: Bibasilar airspace opacities. The perihilar opacities have mildly improved. Interstitial accentuation with indistinct pulmonary vasculature. Blunting of the costophrenic angle suggesting a component of pleural effusions bilaterally. Mild enlargement of the cardiopericardial silhouette. Atherosclerotic aortic arch. Degenerative left glenohumeral findings. IMPRESSION: 1. Appearance favoring congestive heart failure, minimal reduction in the perihilar airspace opacities compared to yesterday's exam. Suspected bilateral pleural effusions with residual interstitial edema and mild basilar airspace edema. Electronically Signed   By: Gaylyn RongWalter  Liebkemann M.D.   On: 02/02/2016 11:24   ASSESSMENT AND PLAN:  82 year old male history of insulin requiring type 2 diabetes presenting with foot ulcer. Chest pain, non st elevation MI: on aspirin, Plavix, discontinue the nitroglycerin, heparin drip,seen by cardiology    , patient had flash pulmonary edema ;improved.now on 4litres [place],urine out put good. Transfer out of ICU  Acute on chronic  systolic heart failure/'stable.    # Right foot osteomyelitis: podiatry consult with Dr Alberteen Spindleline noted   -s/p Amputation right great toe with partial first ray resection. Excisional debridement ulceration right fifth metatarsal full-thickness to joint capsule by Dr cline;dressigns changed today. -MRI right foot results noted Nausea ,poor po intake; changed his antibiotic to  IVCipro, Ancef   Use zofran for nausea   -# Type 2 diabetes insulin requiring; . Using SSI with coverage. Lantus.  # Hypothyroidism unspecified: synthroid  # Essential hypertension:;   # DVT prophylaxis;heparin drip # Hypokalemia repleted  Chronic renal failure, CK D stage III: Stable.  D/w pt and RN     Case discussed with Care Management/Social Worker. Management plans discussed with the patient, family and they are in agreement.  CODE STATUS: FULL  DVT Prophylaxis: lovenox  TOTAL CC TIME TAKING CARE OF THIS PATIENT: 35 minutes.   POSSIBLE D/C IN 1-2 DAYS, DEPENDING ON CLINICAL CONDITION.  Katha HammingKONIDENA,Alper Guilmette M.D on 02/03/2016 at 2:18 PM  Between 7am to 6pm - Pager - [phone]  After 6pm go to [email or web address] - password [email or web address] [facility]  [facility]  Office  [phone]  CC: Primary care physician; Dione Housekeeperlmedo, Mario Ernesto, MD   Note: This dictation was prepared with Dragon dictation along with smaller phrase technology. Any transcriptional errors that result from this process are unintentional.

## 2016-02-03 NOTE — Care Management (Signed)
Patient still has not been able to lie in supine position.  Cardiac cath will be deferred.  Continue diuresis

## 2016-02-03 NOTE — Progress Notes (Signed)
[facility]  SUBJECTIVE: still sob, especially when in a supine position. Did not tolerate supine position for more than five minutes this am.    Vitals:   02/03/16 0300 02/03/16 0400 02/03/16 0500 02/03/16 0600  BP: 108/62 (!) 112/57 117/60 115/61  Pulse: (!) 102 100 (!) 102 (!) 101  Resp: (!) 23 (!) 21 (!) 22 (!) 21  Temp:  98.6 F (37 C)    TempSrc:  Axillary    SpO2: 98% 96% 96% 95%  Weight:      Height:        Intake/Output Summary (Last 24 hours) at 02/03/16 0824 Last data filed at 02/03/16 0600  Gross per 24 hour  Intake          1536.83 ml  Output             1455 ml  Net            81.83 ml    LABS: Basic Metabolic Panel:  Recent Labs  16/12/9609/15/17 0518 02/01/16 1722 02/03/16 0332  NA 132* 129* 130*  K 4.7 4.9 4.2  CL 95* 93* 94*  CO2 25 25 29   GLUCOSE 300* 412* 282*  BUN 30* 36* 39*  CREATININE 1.76* 1.97* 1.55*  CALCIUM 7.9* 8.0* 8.0*  MG 2.1  --   --    Liver Function Tests: No results for input(s): AST, ALT, ALKPHOS, BILITOT, PROT, ALBUMIN in the last 72 hours. No results for input(s): LIPASE, AMYLASE in the last 72 hours. CBC:  Recent Labs  02/02/16 1150 02/03/16 0332  WBC 14.3* 10.9*  HGB 10.2* 10.0*  HCT 31.4* 29.8*  MCV 85.6 85.4  PLT 176 157   Cardiac Enzymes:  Recent Labs  02/01/16 1722 02/01/16 2243 02/02/16 0409  TROPONINI 1.08* 1.19* 1.13*   BNP: Invalid input(s): POCBNP D-Dimer: No results for input(s): DDIMER in the last 72 hours. Hemoglobin A1C: No results for input(s): HGBA1C in the last 72 hours. Fasting Lipid Panel: No results for input(s): CHOL, HDL, LDLCALC, TRIG, CHOLHDL, LDLDIRECT in the last 72 hours. Thyroid Function Tests: No results for input(s): TSH, T4TOTAL, T3FREE, THYROIDAB in the last 72 hours.  Invalid input(s): FREET3 Anemia Panel: No results for input(s): VITAMINB12, FOLATE, FERRITIN, TIBC, IRON, RETICCTPCT in the last 72 hours.   Physical Exam: Blood  pressure 115/61, pulse (!) 101, temperature 98.6 F (37 C), temperature source Axillary, resp. rate (!) 21, height 5\' 8"  (1.727 m), weight 117.5 kg (259 lb), SpO2 95 %.   Wt Readings from Last 1 Encounters:  02/02/16 117.5 kg (259 lb)     General appearance: alert and cooperative Resp: diminished breath sounds bibasilar Cardio: regular rate and rhythm GI: soft, non-tender; bowel sounds normal; no masses,  no organomegaly Extremities: edema 2+ edema Neurologic: Grossly normal  TELEMETRY: Reviewed telemetry pt in sr with no arrhythmia:  ASSESSMENT AND PLAN:  Active Problems:   Osteomyelitis of foot, right, acute (HCC)   Elevated troponin I level-demand vs acs.  Currently stable on iv heparin, asa, plavix. Off iv ntg due to relative hypotension. Will remain off of iv ntg and use ntp as tolerated.  Will discontinue heparin and remain on dual antiplatelet therapy with asa and plavix. Renal function better but very labile. Will attempt to defer cardiac cath if he remains stable. Careful diuresis following renal function. Continue with abx.  OK to transfer back to telemetry if stable.     Jason HeadingKenneth A Cletus Mehlhoff, MD, [facility] 02/03/2016 8:24  AM     

## 2016-02-04 ENCOUNTER — Inpatient Hospital Stay: Payer: Medicare Other

## 2016-02-04 LAB — BASIC METABOLIC PANEL
Anion gap: 6 (ref 5–15)
BUN: 41 mg/dL — AB (ref 6–20)
CALCIUM: 8.2 mg/dL — AB (ref 8.9–10.3)
CHLORIDE: 94 mmol/L — AB (ref 101–111)
CO2: 32 mmol/L (ref 22–32)
CREATININE: 1.58 mg/dL — AB (ref 0.61–1.24)
GFR calc Af Amer: 46 mL/min — ABNORMAL LOW (ref >60)
GFR calc non Af Amer: 39 mL/min — ABNORMAL LOW (ref >60)
GLUCOSE: 291 mg/dL — AB (ref 65–99)
Potassium: 4.1 mmol/L (ref 3.5–5.1)
Sodium: 132 mmol/L — ABNORMAL LOW (ref 135–145)

## 2016-02-04 LAB — TROPONIN I: TROPONIN I: 1.1 ng/mL — AB (ref <0.03)

## 2016-02-04 LAB — GLUCOSE, CAPILLARY
GLUCOSE-CAPILLARY: 285 mg/dL — AB (ref 65–99)
GLUCOSE-CAPILLARY: 286 mg/dL — AB (ref 65–99)
Glucose-Capillary: 250 mg/dL — ABNORMAL HIGH (ref 65–99)
Glucose-Capillary: 267 mg/dL — ABNORMAL HIGH (ref 65–99)

## 2016-02-04 LAB — CBC
HCT: 28.6 % — ABNORMAL LOW (ref 40.0–52.0)
Hemoglobin: 9.6 g/dL — ABNORMAL LOW (ref 13.0–18.0)
MCH: 28.3 pg (ref 26.0–34.0)
MCHC: 33.4 g/dL (ref 32.0–36.0)
MCV: 84.8 fL (ref 80.0–100.0)
PLATELETS: 149 10*3/uL — AB (ref 150–440)
RBC: 3.37 MIL/uL — ABNORMAL LOW (ref 4.40–5.90)
RDW: 14.6 % — AB (ref 11.5–14.5)
WBC: 8.6 10*3/uL (ref 3.8–10.6)

## 2016-02-04 NOTE — Evaluation (Signed)
Physical Therapy Re-Evaluation Patient Details Name: Jason [name] MRN: [id number] DOB: 1932-08-20 Today's Date: 02/04/2016   History of Present Illness  Pt is a 82 year old male with osteomylitis of the foot here for amuptation of toe. Patient is s/p right great toe amputation with debridement on 01/28/16; He was at short term rehab facility prior to admittance where he was getting care for last few weeks. Pt was transferred to the CCU but is now back on the floor. Pt is AOx1 at time of evaluation. History felt to be unreliable  Clinical Impression  Pt admitted with above diagnosis. Pt currently with functional limitations due to the deficits listed below (see PT Problem List).  Pt agrees to PT re-evaluation however with very low motivation during session. He requires maxA+1 with HOB elevated and bed rails to come up to sitting. Once uprigh pt continues to lean to the L side. In sitting pt complains of pain in his penis from the catheter. Catheter inspected without signs of tension or obvious source of pain. Pt states that he needs to lay back down. Despite repeated encouragement from therapist he demands to lay back down. Pt refuses to attempt transfers or ambulation on this date. Once back in supine pt states that the catheter pain is gone. Pt is very weak during bed mobility. He was very weak during initial evaluation multiple days ago and appears even weaker on this date. He has refused physical therapy treatment attempts multiple times during this admission and this will be his biggest barrier to SNF placement. However he definitely needs to return to SNF at discharge and is unsafe to go home at this time. Pt will benefit from skilled PT services to address deficits in strength, balance, and mobility in order to return to full function at home.       Follow Up Recommendations SNF    Equipment Recommendations  None recommended by PT    Recommendations for Other Services Rehab consult      Precautions / Restrictions Precautions Precautions: Fall Required Braces or Orthoses: Other Brace/Splint Other Brace/Splint: Post-op shoe on RLE Restrictions Weight Bearing Restrictions: Yes RLE Weight Bearing: Weight bearing as tolerated Other Position/Activity Restrictions: Must wear post-op shoe      Mobility  Bed Mobility Overal bed mobility: Needs Assistance Bed Mobility: Supine to Sit;Sit to Supine     Supine to sit: Max assist Sit to supine: Max assist   General bed mobility comments: Pt requiring maxA+1 for rolling onto L side and to attempt sitting upright at EOB. Pt able to come up to sitting with maxA+1, HOB elevated, and use of bed rails. Once uprigh pt continues to lean to the L side. In sitting pt complains of pain in his penis from the catheter. Catheter inspected without signs of tension or obvious source of pain. Pt states that he needs to lay back down. Despite repeated encouragement from therapist he demands to lay back down. Pt refuses to attempt transfers or ambulation on this date  Transfers                 General transfer comment: Pt refuses  Ambulation/Gait             General Gait Details: Pt refuses  Stairs            Wheelchair Mobility    Modified Rankin (Stroke Patients Only)       Balance Overall balance assessment: Needs assistance Sitting-balance support: Feet supported Sitting balance-Leahy Scale:  Poor Sitting balance - Comments: Pt continues to fall toward his left and reports that he must lay down. Will only remain sitting upright for brief bout                                     Pertinent Vitals/Pain Pain Assessment: No/denies pain Pain Location: Initially denies pain, with sitting upright report catheter related pain Pain Intervention(s): Monitored during session;Repositioned    Home Living Family/patient expects to be discharged to:: Skilled nursing facility Living Arrangements:  Spouse/significant other;Children Available Help at Discharge: Family;Available 24 hours/day Type of Home: Mobile home Home Access: Ramped entrance     Home Layout: One level Home Equipment: Walker - 2 wheels;Bedside commode;Shower seat;Wheelchair - manual;Hand held shower head;Adaptive equipment      Prior Function Level of Independence: Needs assistance   Gait / Transfers Assistance Needed: used RW; was limited due to right foot pain  ADL's / Homemaking Assistance Needed: needs assistance with bathing and dressing; would sleep in recliner when at home.  Comments: Patient lives with wife and son in a mobile home; steps to enter with B rails; however son just had ramp built with large portch area. has RW and manual wheelchair at home; Uses walk in shower with shower seat for bathing;  Pt reports a couple falls in the last 12 months     Hand Dominance   Dominant Hand: Right    Extremity/Trunk Assessment   Upper Extremity Assessment: Generalized weakness;LUE deficits/detail       LUE Deficits / Details: L shoulder flexion weaker than R, grossly 4-/5. Pt reports this is chronic PTA due to LUE fracture   Lower Extremity Assessment: Generalized weakness RLE Deficits / Details: Difficult to assess but weak during bed mobility including rolling and scooting       Communication   Communication: No difficulties  Cognition Arousal/Alertness: Lethargic Behavior During Therapy: WFL for tasks assessed/performed Overall Cognitive Status: No family/caregiver present to determine baseline cognitive functioning (AOx1, confused regarding place, time, and situation)                      General Comments      Exercises     Assessment/Plan    PT Assessment Patient needs continued PT services  PT Problem List Decreased strength;Decreased mobility;Decreased safety awareness;Decreased activity tolerance;Decreased balance;Impaired sensation          PT Treatment Interventions  DME instruction;Gait training;Stair training;Functional mobility training;Neuromuscular re-education;Balance training;Therapeutic exercise;Therapeutic activities;Patient/family education    PT Goals (Current goals can be found in the Care Plan section)  Acute Rehab PT Goals Patient Stated Goal: Improve strength. However pt with low motivation on this date. Multiple refusals during admission to PT PT Goal Formulation: With patient Time For Goal Achievement: 02/18/16 Potential to Achieve Goals: Poor    Frequency 7X/week   Barriers to discharge Decreased caregiver support Wife is there to assist but pt requiring heavy assist    Co-evaluation               End of Session   Activity Tolerance: Patient limited by pain Patient left: in bed;with call bell/phone within reach;with bed alarm set;with nursing/sitter in room           Time: 1610-9604 PT Time Calculation (min) (ACUTE ONLY): 18 min   Charges:   PT Evaluation $PT Eval Moderate Complexity: 1 Procedure     PT  G Codes:       Lynnea MaizesJason D Ahamed Hofland PT, DPT   Kentley Cedillo 02/04/2016, 12:37 PM

## 2016-02-04 NOTE — Progress Notes (Signed)
[facility] [place] [facility]   PATIENT NAME: Jason SchneidersMyron [name]    MR#:  [id number]  DATE OF BIRTH:  1933-01-12  Patient is seen at bedside, he feels better, no chest pain, no shortness of breath, no nausea today and  He promised that he will eat some lunch today.   Came in with ongoing infection of the right foot,s/p right great toe amputation. POD #3   REVIEW OF SYSTEMS:   Review of Systems  Constitutional: Negative for chills, fever and weight loss.  HENT: Negative for ear discharge, ear pain and nosebleeds.   Eyes: Negative for blurred vision, pain and discharge.  Respiratory: Negative for sputum production, shortness of breath, wheezing and stridor.   Cardiovascular: Negative for chest pain, palpitations, orthopnea and PND.  Gastrointestinal: Negative for abdominal pain, diarrhea, nausea and vomiting.  Genitourinary: Negative for frequency and urgency.  Musculoskeletal: Positive for joint pain. Negative for back pain.  Neurological: Positive for weakness. Negative for sensory change, speech change and focal weakness.  Psychiatric/Behavioral: Negative for depression and hallucinations. The patient is not nervous/anxious.    Tolerating Diet:yes Tolerating PT: pending  DRUG ALLERGIES:  No Known Allergies  VITALS:  Blood pressure (!) 109/53, pulse (!) 104, temperature 97.7 F (36.5 C), temperature source Oral, resp. rate 18, height 5\' 8"  (1.727 m), weight 122 kg (268 lb 14.4 oz), SpO2 100 %.  PHYSICAL EXAMINATION:   Physical Exam  GENERAL:  82 y.o.-year-old patient lying in the bed with no acute distress.Denies any chest pain. EYES: Pupils equal, round, reactive to light and accommodation. No scleral icterus. Extraocular muscles intact.  HEENT: Head atraumatic, normocephalic. Oropharynx and nasopharynx clear.  NECK:  Supple, no jugular venous distention. No thyroid enlargement, no tenderness.  LUNGS: decreased at bases. CARDIOVASCULAR: S1, S2  normal. Ejection systolic murmur present in aortic area., rubs, or gallops.  ABDOMEN: Soft, nontender, nondistended. Bowel sounds present. No organomegaly or mass.  EXTREMITIES: No cyanosis, clubbing or edema b/l.   Right chronic nonhealling ulcers/infection of the great toe and 5th digit s/p surgical dressing + NEUROLOGIC: Cranial nerves II through XII are intact. No focal Motor or sensory deficits b/l.   PSYCHIATRIC:  patient is alert and oriented x 3.  SKIN: No obvious rash, lesion, or ulcer.   LABORATORY PANEL:  CBC  Recent Labs Lab 02/04/16 0432  WBC 8.6  HGB 9.6*  HCT 28.6*  PLT 149*    Chemistries   Recent Labs Lab 02/01/16 0518  02/04/16 0432  NA 132*  < > 132*  K 4.7  < > 4.1  CL 95*  < > 94*  CO2 25  < > 32  GLUCOSE 300*  < > 291*  BUN 30*  < > 41*  CREATININE 1.76*  < > 1.58*  CALCIUM 7.9*  < > 8.2*  MG 2.1  --   --   < > = values in this interval not displayed. Cardiac Enzymes  Recent Labs Lab 02/04/16 0432  TROPONINI 1.10*   RADIOLOGY:  Dg Chest Port 1 View  Result Date: 02/04/2016 CLINICAL DATA:  Respiratory failure EXAM: PORTABLE CHEST 1 VIEW COMPARISON:  02/03/2016 FINDINGS: Left line remains in place, unchanged. There is cardiomegaly. Bilateral airspace disease with probable layering effusions again noted, not significantly changed since prior study. IMPRESSION: Bilateral airspace disease and layering effusions, not significantly changed. Electronically Signed   By: Charlett NoseKevin  Dover M.D.   On: 02/04/2016 07:05   [facility]g [facility] 1 View  Result  Date: 02/03/2016 CLINICAL DATA:  Acute onset of respiratory failure. Initial encounter. EXAM: PORTABLE CHEST 1 VIEW COMPARISON:  Chest radiograph performed 02/02/2016 FINDINGS: The lungs are well-aerated. Vascular congestion is noted. Bilateral central and bibasilar airspace opacities may reflect pulmonary edema or pneumonia. Small bilateral pleural effusions are seen. This is mildly worsened from the prior study. No  pneumothorax is seen. The cardiomediastinal silhouette is is borderline enlarged. No acute osseous abnormalities are seen. A left PICC is noted ending about the mid SVC. IMPRESSION: Vascular congestion and borderline cardiomegaly. Bilateral central and bibasilar airspace opacities may reflect pulmonary edema or pneumonia. Small bilateral pleural effusions seen. Airspace opacification is mildly worsened from the prior study. Electronically Signed   By: Roanna RaiderJeffery  Chang M.D.   On: 02/03/2016 03:34   Dg Chest Port 1 View  Result Date: 02/02/2016 CLINICAL DATA:  PICC line placement. Pulmonary edema and bilateral pleural effusions. EXAM: PORTABLE CHEST 1 VIEW COMPARISON:  02/02/2016 FINDINGS: New left arm PICC line is seen with tip overlying the distal SVC. Cardiomegaly is stable.  Aortic atherosclerosis. Diffuse interstitial and airspace disease shows no significant change, consistent with diffuse pulmonary edema. Small layering bilateral pleural effusions are also stable. IMPRESSION: New left arm PICC line in appropriate position. Stable diffuse pulmonary edema and small bilateral pleural effusions. Stable cardiomegaly. Electronically Signed   By: Myles RosenthalJohn  Stahl M.D.   On: 02/02/2016 12:52   Dg Chest Port 1 View  Result Date: 02/02/2016 CLINICAL DATA:  Respiratory failure. Hx of COPD, diabetes, hypertension, stroke. Former smoker EXAM: PORTABLE CHEST 1 VIEW COMPARISON:  02/01/2016 FINDINGS: Bibasilar airspace opacities. The perihilar opacities have mildly improved. Interstitial accentuation with indistinct pulmonary vasculature. Blunting of the costophrenic angle suggesting a component of pleural effusions bilaterally. Mild enlargement of the cardiopericardial silhouette. Atherosclerotic aortic arch. Degenerative left glenohumeral findings. IMPRESSION: 1. Appearance favoring congestive heart failure, minimal reduction in the perihilar airspace opacities compared to yesterday's exam. Suspected bilateral pleural  effusions with residual interstitial edema and mild basilar airspace edema. Electronically Signed   By: Gaylyn RongWalter  Liebkemann M.D.   On: 02/02/2016 11:24   ASSESSMENT AND PLAN:  82 year old male history of insulin requiring type 2 diabetes presenting with foot ulcer.    Chest pain, non st elevation MI: on aspirin, Plavix, discontinue the nitroglycerin, heparin drip,seen by cardiology    , patient had flash pulmonary edema ;improved.now on 4litres [place],urine out put good. Transfer to telemetry. Continue 4 L of oxygen.  Acute on chronic systolic heart failure/'stable.    # Right foot osteomyelitis: podiatry consult with Dr Alberteen Spindleline noted' continue IV antibiotics.   -s/p Amputation right great toe with partial first ray resection. Excisional debridement ulceration right fifth metatarsal full-thickness to joint capsule by Dr cline;dressigns changed today. -MRI right foot results noted   Nausea ,poor po intake; changed his antibiotic to  IVCipro, Ancef   Use zofran for nausea   -# Type 2 diabetes insulin requiring; . Using SSI with coverage. Lantus.;   # Hypothyroidism unspecified: synthroid  # Essential hypertension:;   # DVT prophylaxis; on aspirin, Plavix. # Hypokalemia repleted  Chronic renal failure, CK D stage III ;renal function is improved.  D/w pt and RN     Case discussed with Care Management/Social Worker. Management plans discussed with the patient, family and they are in agreement.  CODE STATUS: FULL  DVT Prophylaxis: lovenox  TOTAL CC TIME TAKING CARE OF THIS PATIENT: 35 minutes.   POSSIBLE D/C IN 1-2 DAYS, DEPENDING ON CLINICAL CONDITION.  Katha Hamming M.D on 02/04/2016 at 11:13 AM  Between 7am to 6pm - Pager - [phone]  After 6pm go to [email or web address] - password [email or web address] [facility]  [place] [facility]  Office  [phone]  CC: Primary care physician; [name], MD   Note: This dictation was prepared with Dragon dictation along  with smaller phrase technology. Any transcriptional errors that result from this process are unintentional.

## 2016-02-05 ENCOUNTER — Inpatient Hospital Stay: Payer: Medicare Other

## 2016-02-05 LAB — GLUCOSE, CAPILLARY
GLUCOSE-CAPILLARY: 262 mg/dL — AB (ref 65–99)
GLUCOSE-CAPILLARY: 212 mg/dL — AB (ref 65–99)
Glucose-Capillary: 336 mg/dL — ABNORMAL HIGH (ref 65–99)
Glucose-Capillary: 274 mg/dL — ABNORMAL HIGH (ref 65–99)

## 2016-02-05 MED ORDER — FUROSEMIDE 10 MG/ML IJ SOLN
20.0000 mg | Freq: Once | INTRAMUSCULAR | Status: AC
  Administered 2016-02-05: 20 mg via INTRAVENOUS
  Filled 2016-02-05: qty 2

## 2016-02-05 NOTE — Plan of Care (Signed)
Problem: Physical Regulation: Goal: Ability to maintain clinical measurements within normal limits will improve Outcome: Not Progressing Encourage patient to participate in physical therapy and up to chair for meals.

## 2016-02-05 NOTE — Progress Notes (Signed)
Pt is complaining of chest pain and shortness of breath.  Vital signs taken and nitro given x1 and pt state he had relief.  Ox sat's at 3285 and respitory is giving a treatment. Will continue to monitor the pt

## 2016-02-05 NOTE — Progress Notes (Signed)
[facility] pt[facility] D/t sob and low sats on 3 lpm.. BBS fine crackles. afer tx. sats to 5698 with tx. SOB resolved..will continue to monitor

## 2016-02-05 NOTE — Progress Notes (Signed)
Physical Therapy Treatment Patient Details Name: Jason HerculesMyron L Oertel MRN: [id number] DOB: Apr 21, 1932 Today's Date: 02/05/2016    History of Present Illness Pt is a 82 year old male with osteomylitis of the foot here for amuptation of toe. Patient is s/p right great toe amputation with debridement on 01/28/16; He was at short term rehab facility prior to admittance where he was getting care for last few weeks. Pt was transferred to the CCU but is now back on the floor. Pt is AOx1 at time of evaluation. History felt to be unreliable    PT Comments    Chart reviewed.  Discussed with RN prior to session and received OK for therapy this am.  Pt initially declining therapy due to general malaise and fatigue.  He agreed to supine exercises.  Participated in exercises as described below.  Pt needed verbal cues to remain engaged in therapy session.  Kept eyes closed through most of treatment.  Refused out of bed activities.   Follow Up Recommendations  SNF     Equipment Recommendations  None recommended by PT    Recommendations for Other Services       Precautions / Restrictions Precautions Precautions: Fall Required Braces or Orthoses: Other Brace/Splint Other Brace/Splint: Post-op shoe on RLE Restrictions Weight Bearing Restrictions: Yes RLE Weight Bearing: Weight bearing as tolerated Other Position/Activity Restrictions: Must wear post-op shoe    Mobility  Bed Mobility                  Transfers                    Ambulation/Gait                 Stairs            Wheelchair Mobility    Modified Rankin (Stroke Patients Only)       Balance                                    Cognition Arousal/Alertness: Lethargic Behavior During Therapy: WFL for tasks assessed/performed Overall Cognitive Status: No family/caregiver present to determine baseline cognitive functioning                      Exercises Other Exercises Other  Exercises: Pt participated in A/AAROM BLE in supine 2 x 10 reps for quad and glut sets, heel slides, ab/adduction and ankle pumps    General Comments        Pertinent Vitals/Pain Pain Assessment: No/denies pain    Home Living                      Prior Function            PT Goals (current goals can now be found in the care plan section)      Frequency    7X/week      PT Plan      Co-evaluation             End of Session Equipment Utilized During Treatment: Oxygen Activity Tolerance: Patient limited by lethargy;Patient limited by fatigue Patient left: in bed;with call bell/phone within reach;with bed alarm set;with nursing/sitter in room     Time: [id number] PT Time Calculation (min) (ACUTE ONLY): 10 min  Charges:  $Therapeutic Exercise: 8-22 mins  G Codes:      Danielle DessSarah Lulubelle Simcoe 02/05/2016, 9:58 AM

## 2016-02-05 NOTE — Progress Notes (Signed)
[facility] - [place] at [facility]   PATIENT NAME: Jason [name]    MR#:  [id number]  DATE OF BIRTH:  10/07/1932    he had chest pain this morning, shortness of breath. Chest pain relieved with nitroglycerin. Also has some wheezing so we gave him breathing treatments. Has some nausea, poor by mouth intake. Physical therapy recommended skilled nursing. No EKG changes.   Came in with ongoing infection of the right foot,s/p right great toe amputation.    REVIEW OF SYSTEMS:   Review of Systems  Constitutional: Negative for chills, fever and weight loss.  HENT: Negative for ear discharge, ear pain and nosebleeds.   Eyes: Negative for blurred vision, pain and discharge.  Respiratory: Positive for shortness of breath and wheezing. Negative for sputum production and stridor.   Cardiovascular: Positive for chest pain. Negative for palpitations, orthopnea and PND.  Gastrointestinal: Negative for abdominal pain, diarrhea, nausea and vomiting.  Genitourinary: Negative for frequency and urgency.  Musculoskeletal: Positive for joint pain. Negative for back pain.  Neurological: Positive for weakness. Negative for sensory change, speech change and focal weakness.  Psychiatric/Behavioral: Negative for depression and hallucinations. The patient is not nervous/anxious.    Tolerating Diet:yes Tolerating PT: pending  DRUG ALLERGIES:  No Known Allergies  VITALS:  Blood pressure 124/68, pulse (!) 105, temperature 97.5 F (36.4 C), temperature source Oral, resp. rate 18, height 5\' 8"  (1.727 m), weight 122 kg (268 lb 14.4 oz), SpO2 (!) 81 %.  PHYSICAL EXAMINATION:   Physical Exam  GENERAL:  82 y.o.-year-old patient lying in the bed with no acute distress.Denies any chest pain. EYES: Pupils equal, round, reactive to light and accommodation. No scleral icterus. Extraocular muscles intact.  HEENT: Head atraumatic, normocephalic. Oropharynx and nasopharynx clear.  NECK:   Supple, no jugular venous distention. No thyroid enlargement, no tenderness.  LUNGS: decreased at bases.Patient has wheezing in the left lung. CARDIOVASCULAR: S1, S2 normal. Ejection systolic murmur present in aortic area., rubs, or gallops.  ABDOMEN: Soft, nontender, nondistended. Bowel sounds present. No organomegaly or mass.  EXTREMITIES: No cyanosis, clubbing or edema b/l.   Right chronic nonhealling ulcers/infection of the great toe and 5th digit s/p surgical dressing + NEUROLOGIC: Cranial nerves II through XII are intact. No focal Motor or sensory deficits b/l.   PSYCHIATRIC:  patient is alert and oriented x 3.  SKIN: No obvious rash, lesion, or ulcer.   LABORATORY PANEL:  CBC  Recent Labs Lab 02/04/16 0432  WBC 8.6  HGB 9.6*  HCT 28.6*  PLT 149*    Chemistries   Recent Labs Lab 02/01/16 0518  02/04/16 0432  NA 132*  < > 132*  K 4.7  < > 4.1  CL 95*  < > 94*  CO2 25  < > 32  GLUCOSE 300*  < > 291*  BUN 30*  < > 41*  CREATININE 1.76*  < > 1.58*  CALCIUM 7.9*  < > 8.2*  MG 2.1  --   --   < > = values in this interval not displayed. Cardiac Enzymes  Recent Labs Lab 02/04/16 0432  TROPONINI 1.10*   RADIOLOGY:  Dg Chest Port 1 View  Result Date: 02/04/2016 CLINICAL DATA:  Respiratory failure EXAM: PORTABLE CHEST 1 VIEW COMPARISON:  02/03/2016 FINDINGS: Left line remains in place, unchanged. There is cardiomegaly. Bilateral airspace disease with probable layering effusions again noted, not significantly changed since prior study. IMPRESSION: Bilateral airspace disease and layering effusions, not significantly changed. Electronically  Signed   By: Charlett NoseKevin  Dover M.D.   On: 02/04/2016 07:05   ASSESSMENT AND PLAN:  82 year old male history of insulin requiring type 2 diabetes presenting with foot ulcer.    Chest pain, non st elevation MI: on aspirin, Plavix, discontinue the nitroglycerin, heparin drip,seen by cardiology  Patient can use nitroglycerin as needed for  chest pain, appreciate cardiology following on this patient. Reactive airway disease: Chest x-ray today, continue nebulizers, continue oxygen'   , patient had flash pulmonary edema ; 2 days ago: Improved now, continue oxygen, repeat chest x-ray today, For non-ST elevation MI patient was in ICU for 24 hours started on heparin, nitro drip, planned to have cardiac catheter, but patient could not lie down flat to cardiac catheter was not done, so we discontinued the nitro drip, heparin drip, patient was given aspirin, Plavix, and transferred to telemetry.  Acute on chronic systolic heart failure/'stable.    # Right foot osteomyelitis: podiatry consult with Dr Alberteen Spindleline noted' continue IV antibiotics.   -s/p Amputation right great toe with partial first ray resection. Excisional debridement ulceration right fifth metatarsal full-thickness to joint capsule by Dr cline;dressigns changed today. -MRI right foot results noted   Nausea ,poor po intake; changed his antibiotic to  IVCipro, Ancef   Use zofran for nausea   -# Type 2 diabetes insulin requiring; . Using SSI with coverage. Lantus.;   # Hypothyroidism unspecified: synthroid  # Essential hypertension:;   # DVT prophylaxis; on aspirin, Plavix. # Hypokalemia repleted  Chronic renal failure, CK D stage III ;renal function is improved.  D/w pt and RN     Case discussed with Care Management/Social Worker. Management plans discussed with the patient, family and they are in agreement.  CODE STATUS: FULL  DVT Prophylaxis: lovenox  TOTAL CC TIME TAKING CARE OF THIS PATIENT: 35 minutes.   POSSIBLE D/C IN 1-2 DAYS, DEPENDING ON CLINICAL CONDITION.  Katha HammingKONIDENA,Krupa Stege M.D on 02/05/2016 at 9:58 AM  Between 7am to 6pm - Pager - [phone]  After 6pm go to [email or web address] - password [email or web address] [email or web address]  [facility] [place]  Office  [phone]  CC: Primary care physician; Dione Housekeeperlmedo, Mario Ernesto, MD   Note: This dictation was  prepared with Dragon dictation along with smaller phrase technology. Any transcriptional errors that result from this process are unintentional.

## 2016-02-06 ENCOUNTER — Inpatient Hospital Stay: Payer: Medicare Other

## 2016-02-06 LAB — GLUCOSE, CAPILLARY
GLUCOSE-CAPILLARY: 170 mg/dL — AB (ref 65–99)
GLUCOSE-CAPILLARY: 248 mg/dL — AB (ref 65–99)
GLUCOSE-CAPILLARY: 261 mg/dL — AB (ref 65–99)
Glucose-Capillary: 224 mg/dL — ABNORMAL HIGH (ref 65–99)

## 2016-02-06 LAB — CBC
HCT: 29.5 % — ABNORMAL LOW (ref 40.0–52.0)
Hemoglobin: 10 g/dL — ABNORMAL LOW (ref 13.0–18.0)
MCH: 29.4 pg (ref 26.0–34.0)
MCHC: 33.7 g/dL (ref 32.0–36.0)
MCV: 87.1 fL (ref 80.0–100.0)
PLATELETS: 163 10*3/uL (ref 150–440)
RBC: 3.39 MIL/uL — AB (ref 4.40–5.90)
RDW: 15 % — ABNORMAL HIGH (ref 11.5–14.5)
WBC: 9.4 10*3/uL (ref 3.8–10.6)

## 2016-02-06 LAB — CREATININE, SERUM
Creatinine, Ser: 1.52 mg/dL — ABNORMAL HIGH (ref 0.61–1.24)
GFR calc Af Amer: 48 mL/min — ABNORMAL LOW
GFR calc non Af Amer: 41 mL/min — ABNORMAL LOW

## 2016-02-06 LAB — TROPONIN I: Troponin I: 0.78 ng/mL (ref <0.03)

## 2016-02-06 MED ORDER — MORPHINE SULFATE (PF) 4 MG/ML IV SOLN
2.0000 mg | INTRAVENOUS | Status: DC | PRN
  Administered 2016-02-06 – 2016-02-07 (×2): 2 mg via INTRAVENOUS
  Filled 2016-02-06 (×3): qty 1

## 2016-02-06 MED ORDER — FUROSEMIDE 10 MG/ML IJ SOLN
20.0000 mg | Freq: Once | INTRAMUSCULAR | Status: AC
  Administered 2016-02-06: 20 mg via INTRAVENOUS
  Filled 2016-02-06: qty 2

## 2016-02-06 MED ORDER — ENOXAPARIN SODIUM 80 MG/0.8ML ~~LOC~~ SOLN
80.0000 mg | Freq: Once | SUBCUTANEOUS | Status: AC
  Administered 2016-02-06: 80 mg via SUBCUTANEOUS
  Filled 2016-02-06: qty 0.8

## 2016-02-06 MED ORDER — ENOXAPARIN SODIUM 120 MG/0.8ML ~~LOC~~ SOLN
1.0000 mg/kg | Freq: Two times a day (BID) | SUBCUTANEOUS | Status: DC
  Administered 2016-02-07 – 2016-02-08 (×4): 120 mg via SUBCUTANEOUS
  Filled 2016-02-06: qty 0.8
  Filled 2016-02-06: qty 0.81
  Filled 2016-02-06 (×2): qty 1.6
  Filled 2016-02-06 (×2): qty 0.81
  Filled 2016-02-06: qty 1.6

## 2016-02-06 NOTE — Progress Notes (Signed)
Pt complains of chest pain states it is secondary to him feeling short of breath. VSS, lungs diminished on left, fine crackles on right,oxygen sat is 91% on 3L per [place]. I administered a prn nebulizer. I will continue to assess.

## 2016-02-06 NOTE — Clinical Social Work Note (Signed)
MSW met with patient and his family, patient and family would like to consider a different SNF for short term rehab.  Patient's family was requesting [facility] in [place], [place].  MSW contacted [facility] SNF they requested clinical information to be faxed to SNF.  MSW faxed information to SNF, awaiting response back from SNF, MSW was also given permission to fax to other SNFs in [facility].  MSW to continue to follow patient's progress throughout discharge planning.  Jones Broom. Mariacristina Aday, MSW [phone]  Mon-Fri 8a-4:30p 02/06/2016 5:11 PM

## 2016-02-06 NOTE — Consult Note (Signed)
ANTICOAGULATION CONSULT NOTE - Initial Consult  Pharmacy Consult for lovenox Indication: chest pain/ACS  No Known Allergies  Patient Measurements: Height: 5\' 8"  (172.7 cm) Weight: 268 lb 14.4 oz (122 kg) IBW/kg (Calculated) : 68.4 Heparin Dosing Weight:   Vital Signs: Temp: 97.7 F (36.5 C) (11/20 1128) Temp Source: Oral (11/20 1128) BP: 105/51 (11/20 1916) Pulse Rate: 109 (11/20 1916)  Labs:  Recent Labs  02/04/16 0432  HGB 9.6*  HCT 28.6*  PLT 149*  CREATININE 1.58*  TROPONINI 1.10*    Estimated Creatinine Clearance: 46.6 mL/min (by C-G formula based on SCr of 1.58 mg/dL (H)).   Medical History: Past Medical History:  Diagnosis Date  . BPH (benign prostatic hyperplasia)   . Constipation   . COPD (chronic obstructive pulmonary disease) (HCC)   . Diabetes mellitus without complication (HCC)   . GERD (gastroesophageal reflux disease)   . HTN (hypertension)   . Hypothyroidism   . Stroke [facility](HCC)     Medications:  Scheduled:  . aspirin EC  81 mg Oral Daily  . ciprofloxacin  500 mg Oral BID  . clopidogrel  75 mg Oral Daily  . [START ON 02/07/2016] enoxaparin (LOVENOX) injection  1 mg/kg Subcutaneous Q12H  . enoxaparin (LOVENOX) injection  80 mg Subcutaneous Once  . famotidine  20 mg Oral Daily  . feeding supplement (GLUCERNA SHAKE)  237 mL Oral TID BM  . FLUoxetine  40 mg Oral Daily  . insulin aspart  0-15 Units Subcutaneous TID WC  . insulin aspart  0-5 Units Subcutaneous QHS  . insulin glargine  40 Units Subcutaneous Q supper  . levothyroxine  125 mcg Oral Q0600  . mometasone-formoterol  2 puff Inhalation BID  . nitroGLYCERIN  1 inch Topical Q6H  . pantoprazole  40 mg Oral Daily  . senna-docusate  2 tablet Oral BID  . simvastatin  20 mg Oral QHS  . sodium chloride flush  10-40 mL Intracatheter Q12H  . tamsulosin  0.4 mg Oral Daily    Assessment: Pt is a 82 year old male who is 4 days post op of amputation. Patient is complaining of chest pain, SOB.  Pt received prophylatic dose of lovenox at 1735 this evening. Pharmacy is consulted to dose lovenox for possible ACS or PE.   Goal of Therapy:   Monitor platelets by anticoagulation protocol: Yes   Plan:  Will give an additional 80mg  tonight for a total of 120mg . Then 120mg  (1mg /kg) q 12 hours.   Olene FlossMelissa D Ranetta Armacost, Pharm.D Clinical Pharmacist  02/06/2016,7:38 PM

## 2016-02-06 NOTE — Progress Notes (Signed)
Physical Therapy Treatment Patient Details Name: Jason [name] MRN: [id number] DOB: 1932-05-27 Today's Date: 02/06/2016    History of Present Illness Pt is a 82 year old male with osteomylitis of the foot here for amuptation of toe. Patient is s/p right great toe amputation with debridement on 01/28/16; He was at short term rehab facility prior to admittance where he was getting care for last few weeks. Pt was transferred to the CCU but is now back on the floor. Pt is AOx1 at time of evaluation. History felt to be unreliable    PT Comments    Pt has complaints of fatigue, weakness and nausea. Pt notes not having any appetite. Encouraged with suggestions of possible tolerable foods to all pt to take medication with something in his stomach versus empty, which may be contributing to nausea. Pt did not feel up to physically participating with PT and does not wish out of bed. It is noted that pt's chuck pad is very wet although pt was just changed. Call assistant who noted she would be there soon. Discussion/education with pt and family regarding importance of exercise/activity and how to perform bed exercises. Pt/family understands to try and perform several times throughout the day as able. Assistant had not arrived, pt's son assist therapist with rolling pt side to side to remove wet pad and replace with a clean,dry pad. Max A x 2 for rolling. Continue PT to progress strength, endurance and activity to improve functional mobility.   Follow Up Recommendations  SNF     Equipment Recommendations       Recommendations for Other Services       Precautions / Restrictions Precautions Precautions: Fall Required Braces or Orthoses: Other Brace/Splint Other Brace/Splint: Post-op shoe on RLE Restrictions Weight Bearing Restrictions: Yes RLE Weight Bearing: Weight bearing as tolerated Other Position/Activity Restrictions: Must wear post-op shoe    Mobility  Bed Mobility Overal bed mobility:  Needs Assistance Bed Mobility: Rolling Rolling: Max assist;+2 for physical assistance         General bed mobility comments: Refused sitting edge of bed  Transfers                    Ambulation/Gait                 Stairs            Wheelchair Mobility    Modified Rankin (Stroke Patients Only)       Balance                                    Cognition Arousal/Alertness: Awake/alert (Fatigued/feels nauseated/sickly) Behavior During Therapy: WFL for tasks assessed/performed Overall Cognitive Status: Within Functional Limits for tasks assessed                      Exercises Other Exercises Other Exercises: Pt/family educated on supine exercises for ankle pumps, QS, GS, hip ABD/ADD, heel slides and SAQ for tech, reps, and frequency as well as overall importance    General Comments        Pertinent Vitals/Pain Pain Assessment: No/denies pain    Home Living                      Prior Function            PT Goals (current goals can now be found in  the care plan section) Progress towards PT goals: Not progressing toward goals - comment    Frequency    7X/week      PT Plan Current plan remains appropriate    Co-evaluation             End of Session Equipment Utilized During Treatment: Oxygen Activity Tolerance: Patient limited by fatigue (nausea) Patient left: in bed;with call bell/phone within reach;with bed alarm set;with family/visitor present;with nursing/sitter in room     Time: 1351-1410 PT Time Calculation (min) (ACUTE ONLY): 19 min  Charges:  $Therapeutic Exercise: 8-22 mins                    G C[place]:      Devontae Casasola E Rhiana Morash, PTA 02/06/2016, 4:14 PM

## 2016-02-06 NOTE — Plan of Care (Signed)
Problem: Pain Management: Goal: General experience of comfort will improve Outcome: Progressing No voiced complaints at this time.  Right foot dressing remains intact.  Foot/leg warm to touch and pink in color.

## 2016-02-06 NOTE — Progress Notes (Addendum)
Pt complains of chest pain. MD notified. Order for morphine received. Performed EKG, nebulizer given. VSS, oxygen saturation is 93%.one SL nitro given.  MD notified to round on patient. I will continue to assess.

## 2016-02-06 NOTE — Progress Notes (Signed)
4 Days Post-Op  Subjective: Patient seen. Still complains of some nausea but no pain in his foot.  Objective: Vital signs in last 24 hours: Temp:  [97.5 F (36.4 C)-97.7 F (36.5 C)] 97.7 F (36.5 C) (11/20 1128) Pulse Rate:  [100-106] 106 (11/20 1250) Resp:  [16-18] 16 (11/20 1128) BP: (96-124)/(48-55) 106/48 (11/20 1250) SpO2:  [90 %-98 %] 90 % (11/20 1250) Last BM Date: 02/05/16  Intake/Output from previous day: 11/19 0701 - 11/20 0700 In: 240 [P.O.:240] Out: 400 [Urine:400] Intake/Output this shift: No intake/output data recorded.  Less bleeding on the bandage amputation site. Still some mild bleeding but no evidence of purulence. The fifth metatarsal site has minimal to no drainage. No significant progression of the necrosis along the plantar toe flap or the lateral aspect of the right foot just proximal to the debridement site. A small amount of purulence is noted from the heel ulceration. No purulence could be expressed and there is no significant cellulitis noted.  Lab Results:   Recent Labs  02/04/16 0432  WBC 8.6  HGB 9.6*  HCT 28.6*  PLT 149*   BMET  Recent Labs  02/04/16 0432  NA 132*  K 4.1  CL 94*  CO2 32  GLUCOSE 291*  BUN 41*  CREATININE 1.58*  CALCIUM 8.2*   PT/INR No results for input(s): LABPROT, INR in the last 72 hours. ABG No results for input(s): PHART, HCO3 in the last 72 hours.  Invalid input(s): PCO2, PO2  Studies/Results: Dg Chest 1 View  Result Date: 02/05/2016 CLINICAL DATA:  Shortness of Breath EXAM: CHEST 1 VIEW COMPARISON:  02/04/2016 FINDINGS: Left PICC line remains in place, unchanged. There is cardiomegaly. Diffuse bilateral airspace opacities and small effusions. No significant change since prior study. No acute bony abnormality. IMPRESSION: Bilateral airspace disease and small effusions again noted. Findings likely reflect CHF. No change. Electronically Signed   By: Charlett NoseKevin  Dover M.D.   On: 02/05/2016 10:44     Anti-infectives: Anti-infectives    Start     Dose/Rate Route Frequency Ordered Stop   02/03/16 1015  ciprofloxacin (CIPRO) tablet 500 mg     500 mg Oral 2 times daily 02/03/16 1007     02/01/16 1600  Ampicillin-Sulbactam (UNASYN) 3 g in sodium chloride 0.9 % 100 mL IVPB  Status:  Discontinued     3 g 200 mL/hr over 30 Minutes Intravenous Every 6 hours 02/01/16 1520 02/02/16 1158   02/01/16 1000  ciprofloxacin (CIPRO) IVPB 400 mg  Status:  Discontinued     400 mg 200 mL/hr over 60 Minutes Intravenous Every 12 hours 02/01/16 0926 02/03/16 1007   02/01/16 1000  ceFAZolin (ANCEF) IVPB 1 g/50 mL premix  Status:  Discontinued     1 g 100 mL/hr over 30 Minutes Intravenous Every 8 hours 02/01/16 0926 02/01/16 1520   01/29/16 1330  ciprofloxacin (CIPRO) tablet 500 mg  Status:  Discontinued     500 mg Oral 2 times daily 01/29/16 1131 02/01/16 0926   01/29/16 1330  amoxicillin-clavulanate (AUGMENTIN) 875-125 MG per tablet 1 tablet  Status:  Discontinued     1 tablet Oral Every 12 hours 01/29/16 1131 02/01/16 0926   01/29/16 0800  vancomycin (VANCOCIN) 1,250 mg in sodium chloride 0.9 % 250 mL IVPB  Status:  Discontinued     1,250 mg 166.7 mL/hr over 90 Minutes Intravenous Every 24 hours 01/29/16 0203 01/29/16 1131   01/26/16 0600  piperacillin-tazobactam (ZOSYN) IVPB 3.375 g  Status:  Discontinued  3.375 g 12.5 mL/hr over 240 Minutes Intravenous Every 8 hours 01/25/16 2009 01/29/16 1131   01/26/16 0200  vancomycin (VANCOCIN) 1,500 mg in sodium chloride 0.9 % 500 mL IVPB  Status:  Discontinued     1,500 mg 250 mL/hr over 120 Minutes Intravenous Every 24 hours 01/25/16 2008 01/29/16 0200   01/25/16 2200  piperacillin-tazobactam (ZOSYN) IVPB 3.375 g  Status:  Discontinued     3.375 g 12.5 mL/hr over 240 Minutes Intravenous Every 8 hours 01/25/16 1751 01/25/16 1753   01/25/16 1830  vancomycin (VANCOCIN) IVPB 1000 mg/200 mL premix     1,000 mg 200 mL/hr over 60 Minutes Intravenous  Once  01/25/16 1752 01/25/16 2105   01/25/16 1830  piperacillin-tazobactam (ZOSYN) IVPB 3.375 g     3.375 g 12.5 mL/hr over 240 Minutes Intravenous  Once 01/25/16 1753 01/26/16 0005   01/25/16 1800  vancomycin (VANCOCIN) IVPB 1000 mg/200 mL premix  Status:  Discontinued     1,000 mg 200 mL/hr over 60 Minutes Intravenous Every 24 hours 01/25/16 1751 01/25/16 1753      Assessment/Plan: s/p Procedure(s): INVASIVE LAB ABORTED/CANCEL CASE Assessment: Status post first ray resection and debridement right foot, stable but guarded   Plan: Sterile dressing reapplied to the right foot. At this point would recommend dressing changes every other  With dry sterile gauze bandages to the forefoot region and Santyl ointment and a wet-to-dry dressing to the heel ulceration area.stable for now but would most likely need to be off of his Plavix for about 5 days. We will plan on follow-up outpatient in the office next week f reevaluation. Should be stable for transfer as far as his foot is concerned  LOS: 12 days    Ricci Barkerodd W Silas Muff 02/06/2016

## 2016-02-06 NOTE — Progress Notes (Addendum)
[facility] [place] [facility]   PATIENT NAME: Jason [name]    MRN#:  [id number]  DATE OF BIRTH:  Feb 22, 1933  SUBJECTIVE:  Called by nursing staff patient with chest pain, shortness of breath  Patient states he has pain middle chest described only as "pain" nonradiating no worsening or relieving factors REVIEW OF SYSTEMS:  Did not obtain given medical acuity  DRUG ALLERGIES:  No Known Allergies  VITALS:  Blood pressure (!) 105/51, pulse (!) 109, temperature 97.7 F (36.5 C), temperature source Oral, resp. rate 16, height 5\' 8"  (1.727 m), weight 122 kg (268 lb 14.4 oz), SpO2 92 %.  PHYSICAL EXAMINATION:  VITAL SIGNS: Vitals:   02/06/16 1914 02/06/16 1916  BP: (!) 104/56 (!) 105/51  Pulse: (!) 109 (!) 109  Resp:    Temp:     GENERAL:82 y.o.male currently in no acute distress. Ill-appearing HEAD: Normocephalic, atraumatic.  EYES: Pupils equal, round, reactive to light. Extraocular muscles intact. No scleral icterus.  MOUTH: Moist mucosal membrane. Dentition intact. No abscess noted.  EAR, NOSE, THROAT: Clear without exudates. No external lesions.  NECK: Supple. No thyromegaly. No nodules. No JVD.  PULMONARY: Scant expiratory wheeze left base otherwise diminished breath sounds throughout No use of accessory muscles, Good respiratory effort. good air entry bilaterally CHEST: Nontender to palpation.  CARDIOVASCULAR: S1 and S2. Regular rate and rhythm. No murmurs, rubs, or gallops. No edema. Pedal pulses 2+ bilaterally.  GASTROINTESTINAL: Soft, nontender, nondistended. No masses. Positive bowel sounds. No hepatosplenomegaly.  MUSCULOSKELETAL: No swelling, clubbing, or edema. Range of motion full in all extremities.  NEUROLOGIC: Cranial nerves II through XII are intact. No gross focal neurological deficits. Sensation intact. Reflexes intact.  SKIN: No ulceration, lesions, rashes, or cyanosis. Skin warm and dry. Turgor intact.  PSYCHIATRIC: Mood, affect  flat The patient is awake, alert and oriented x 3. Insight, judgment intact.      LABORATORY PANEL:   CBC  Recent Labs Lab 02/04/16 0432  WBC 8.6  HGB 9.6*  HCT 28.6*  PLT 149*   ------------------------------------------------------------------------------------------------------------------  Chemistries   Recent Labs Lab 02/01/16 0518  02/04/16 0432  NA 132*  < > 132*  [name] 4.7  < > 4.1  CL 95*  < > 94*  CO2 25  < > 32  GLUCOSE 300*  < > 291*  BUN 30*  < > 41*  CREATININE 1.76*  < > 1.58*  CALCIUM 7.9*  < > 8.2*  MG 2.1  --   --   < > = values in this interval not displayed. ------------------------------------------------------------------------------------------------------------------  Cardiac Enzymes  Recent Labs Lab 02/04/16 0432  TROPONINI 1.10*   ------------------------------------------------------------------------------------------------------------------  RADIOLOGY:  Dg Chest 1 View  Result Date: 02/05/2016 CLINICAL DATA:  Shortness of Breath EXAM: CHEST 1 VIEW COMPARISON:  02/04/2016 FINDINGS: Left PICC line remains in place, unchanged. There is cardiomegaly. Diffuse bilateral airspace opacities and small effusions. No significant change since prior study. No acute bony abnormality. IMPRESSION: Bilateral airspace disease and small effusions again noted. Findings likely reflect CHF. No change. Electronically Signed   By: Charlett NoseKevin  Dover M.D.   On: 02/05/2016 10:44    EKG:   Orders placed or performed during the hospital encounter of 01/25/16  . EKG 12-Lead  . EKG 12-Lead  . EKG 12-Lead  . EKG 12-Lead  . EKG 12-Lead  . EKG 12-Lead  . EKG 12-Lead  . EKG 12-Lead    ASSESSMENT AND PLAN:   Jason [name] is a  82 y.o. male presenting with No chief complaint on file. . Admitted 01/25/2016 : Day #: 12 days   1. Chest pain, NSTEMI:  2. Acute respiratory failure with hypoxia   EKG checked there is some concave ST change related to repolarization  abnormality across the anterior leads otherwise no further change from prior EKGs, will check cardiac enzymes - taken off of heparin placed on prophylactic Lovenox will preemptively increased to therapeutic Lovenox while awaiting further tests Provide breathing treatment    We'll get chest x-ray now some evidence of CHF on prior exams blood pressure marginal- - chest x-ray reveals interstitial infiltrate consistent with volume overload, gentle IV diuresis as blood pressure falls  Pulmonary embolism could be a diagnosis given shortness of breath, chest pain, recent surgery, inactivity once again we'll switch to therapeutic Lovenox while awaiting further testing  Patient stabilized prior to me leaving the bedside    Critical time spent 30 minutes    chest x-ray reveals interstitial infiltrate consistent with volume overload, gentle IV diuresis as blood pressure allows   Hower,  Jason Mainlandavid [name] M.D on 02/06/2016 at 7:25 PM  Between 7am to 6pm - Pager - [phone]  After 6pm: House Pager: - [phone]  [facility]  Office  [phone]  CC: Primary care physician; Dione Housekeeperlmedo, Mario Ernesto, MD

## 2016-02-06 NOTE — Progress Notes (Signed)
[facility] [place] [facility]   PATIENT NAME: Jason SchneidersMyron [name]    MR#:  [id number]  DATE OF BIRTH:  Jun 28, 1932    Patient reported shortness of breath today a.m., improved after nebulizer treatments and Lasix 20 mg  Has some nausea, poor by mouth intake.   Came in with ongoing infection of the right foot,s/p right great toe amputation.    REVIEW OF SYSTEMS:   Review of Systems  Constitutional: Negative for chills, fever and weight loss.  HENT: Negative for ear discharge, ear pain and nosebleeds.   Eyes: Negative for blurred vision, pain and discharge.  Respiratory: Positive for shortness of breath and wheezing. Negative for sputum production and stridor.   Cardiovascular: Positive for chest pain. Negative for palpitations, orthopnea and PND.  Gastrointestinal: Negative for abdominal pain, diarrhea, nausea and vomiting.  Genitourinary: Negative for frequency and urgency.  Musculoskeletal: Positive for joint pain. Negative for back pain.  Neurological: Positive for weakness. Negative for sensory change, speech change and focal weakness.  Psychiatric/Behavioral: Negative for depression and hallucinations. The patient is not nervous/anxious.    Tolerating Diet:yes Tolerating PT: pending  DRUG ALLERGIES:  No Known Allergies  VITALS:  Blood pressure (!) 106/48, pulse (!) 106, temperature 97.7 F (36.5 C), temperature source Oral, resp. rate 16, height 5\' 8"  (1.727 m), weight 122 kg (268 lb 14.4 oz), SpO2 90 %.  PHYSICAL EXAMINATION:   Physical Exam  GENERAL:  82 y.o.-year-old patient lying in the bed with no acute distress.Denies any chest pain. EYES: Pupils equal, round, reactive to light and accommodation. No scleral icterus. Extraocular muscles intact.  HEENT: Head atraumatic, normocephalic. Oropharynx and nasopharynx clear.  NECK:  Supple, no jugular venous distention. No thyroid enlargement, no tenderness.  LUNGS: decreased at bases.Patient has  wheezing in the left lung. CARDIOVASCULAR: S1, S2 normal. Ejection systolic murmur present in aortic area., rubs, or gallops.  ABDOMEN: Soft, nontender, nondistended. Bowel sounds present. No organomegaly or mass.  EXTREMITIES: No cyanosis, clubbing or edema b/l.   Right chronic nonhealling ulcers/infection of the great toe and 5th digit s/p surgical dressing + NEUROLOGIC: Cranial nerves II through XII are intact. No focal Motor or sensory deficits b/l.   PSYCHIATRIC:  patient is alert and oriented x 3.  SKIN: No obvious rash, lesion, or ulcer.   LABORATORY PANEL:  CBC  Recent Labs Lab 02/04/16 0432  WBC 8.6  HGB 9.6*  HCT 28.6*  PLT 149*    Chemistries   Recent Labs Lab 02/01/16 0518  02/04/16 0432  NA 132*  < > 132*  K 4.7  < > 4.1  CL 95*  < > 94*  CO2 25  < > 32  GLUCOSE 300*  < > 291*  BUN 30*  < > 41*  CREATININE 1.76*  < > 1.58*  CALCIUM 7.9*  < > 8.2*  MG 2.1  --   --   < > = values in this interval not displayed. Cardiac Enzymes  Recent Labs Lab 02/04/16 0432  TROPONINI 1.10*   RADIOLOGY:  Dg Chest 1 View  Result Date: 02/05/2016 CLINICAL DATA:  Shortness of Breath EXAM: CHEST 1 VIEW COMPARISON:  02/04/2016 FINDINGS: Left PICC line remains in place, unchanged. There is cardiomegaly. Diffuse bilateral airspace opacities and small effusions. No significant change since prior study. No acute bony abnormality. IMPRESSION: Bilateral airspace disease and small effusions again noted. Findings likely reflect CHF. No change. Electronically Signed   By: Charlett NoseKevin  Dover M.D.  On: 02/05/2016 10:44   ASSESSMENT AND PLAN:  82 year old male history of insulin requiring type 2 diabetes presenting with foot ulcer.    #Chest pain, non st elevation MI: on aspirin, Plavix, discontinue the nitroglycerin, heparin drip seen by cardiology  Patient can use nitroglycerin as needed for chest pain, appreciate cardiology following on this patient.   #Acute on chronic systolic  heart failure/'stable. patient had flash pulmonary edema 3  days ago: Improved now, continue oxygen, repeat chest x-ray today, For non-ST elevation MI patient was in ICU for 24 hours started on heparin, nitro drip, planned to have cardiac catheter, but patient could not lie down flat and  cardiac catheterization  was not done, so we discontinued the nitro drip, heparin drip, patient was given aspirin, Plavix,    # Right foot osteomyelitis: podiatry consult with Dr Alberteen Spindleline continue IV antibiotics. s/p Amputation right great toe with partial first ray resection. Excisional debridement ulceration right fifth metatarsal full-thickness to joint capsule by Dr cline;dressigns changes per podiatry  Discussed with Podiatry  -MRI right foot results noted Cultures with Proteus, sensitive to ciprofloxacin- 2 weeks at d/c   # Nausea ,poor po intake; changed his antibiotic to  IVCipro  Use zofran for nausea Consult dietary   -# Type 2 diabetes insulin requiring; . Using SSI with coverage. Lantus.;   # Hypothyroidism unspecified: synthroid  # Essential hypertension:;   # DVT prophylaxis; on aspirin, Plavix.  # Hypokalemia repleted  Chronic renal failure, CK D stage III ;renal function is improved.  D/w pt and RN     Case discussed with Care Management/Social Worker. Management plans discussed with the patient, family and they are in agreement.  CODE STATUS: FULL  DVT Prophylaxis: lovenox  TOTAL CC TIME TAKING CARE OF THIS PATIENT: 35 minutes.   POSSIBLE D/C IN 1-2 DAYS, DEPENDING ON CLINICAL CONDITION.  Ramonita LabGouru, Jeston Junkins M.D on 02/06/2016 at 2:59 PM  Between 7am to 6pm - Pager - [phone]  After 6pm go to [email or web address] - password [email or web address] [facility]  [facility] [place] [facility]  Office  [phone]  CC: Primary care physician; Dione Housekeeperlmedo, Mario Ernesto, MD   Note: This dictation was prepared with Dragon dictation along with smaller phrase technology. Any transcriptional errors that result from  this process are unintentional.

## 2016-02-07 ENCOUNTER — Inpatient Hospital Stay: Payer: Medicare Other

## 2016-02-07 DIAGNOSIS — Z7189 Other specified counseling: Secondary | ICD-10-CM

## 2016-02-07 DIAGNOSIS — R110 Nausea: Secondary | ICD-10-CM

## 2016-02-07 DIAGNOSIS — Z515 Encounter for palliative care: Secondary | ICD-10-CM

## 2016-02-07 LAB — TROPONIN I
TROPONIN I: 0.83 ng/mL — AB (ref <0.03)
Troponin I: 0.88 ng/mL (ref <0.03)

## 2016-02-07 LAB — GLUCOSE, CAPILLARY
GLUCOSE-CAPILLARY: 157 mg/dL — AB (ref 65–99)
Glucose-Capillary: 213 mg/dL — ABNORMAL HIGH (ref 65–99)
Glucose-Capillary: 216 mg/dL — ABNORMAL HIGH (ref 65–99)
Glucose-Capillary: 197 mg/dL — ABNORMAL HIGH (ref 65–99)

## 2016-02-07 MED ORDER — FUROSEMIDE 10 MG/ML IJ SOLN
20.0000 mg | Freq: Two times a day (BID) | INTRAMUSCULAR | Status: DC
  Administered 2016-02-08 – 2016-02-09 (×2): 20 mg via INTRAVENOUS
  Filled 2016-02-07 (×3): qty 2

## 2016-02-07 MED ORDER — INSULIN GLARGINE 100 UNIT/ML ~~LOC~~ SOLN
45.0000 [IU] | Freq: Every day | SUBCUTANEOUS | Status: DC
  Administered 2016-02-07 – 2016-02-08 (×2): 45 [IU] via SUBCUTANEOUS
  Filled 2016-02-07 (×3): qty 0.45

## 2016-02-07 MED ORDER — MEGESTROL ACETATE 400 MG/10ML PO SUSP
200.0000 mg | Freq: Two times a day (BID) | ORAL | Status: DC
  Administered 2016-02-07 – 2016-02-14 (×12): 200 mg via ORAL
  Filled 2016-02-07 (×16): qty 10

## 2016-02-07 MED ORDER — INSULIN ASPART 100 UNIT/ML ~~LOC~~ SOLN
5.0000 [IU] | Freq: Three times a day (TID) | SUBCUTANEOUS | Status: DC
  Administered 2016-02-08 – 2016-02-09 (×4): 5 [IU] via SUBCUTANEOUS
  Administered 2016-02-09: 2.5 [IU] via SUBCUTANEOUS
  Administered 2016-02-09 – 2016-02-13 (×13): 5 [IU] via SUBCUTANEOUS
  Filled 2016-02-07 (×18): qty 5

## 2016-02-07 NOTE — Progress Notes (Signed)
[facility] - [place] [facility]   PATIENT NAME: Jason [name]    MR#:  [id number]  DATE OF BIRTH:  1932-10-03    Patient was shortness of breath last night and also had chest pain intermittently troponins are trending up., improved after nebulizer treatments and Lasix 20 mg  Has some nausea, poor by mouth intake as he has decreased appetite  Came in with ongoing infection of the right foot,s/p right great toe amputation.    REVIEW OF SYSTEMS:   Review of Systems  Constitutional: Negative for chills, fever and weight loss.  HENT: Negative for ear discharge, ear pain and nosebleeds.   Eyes: Negative for blurred vision, pain and discharge.  Respiratory: Positive for shortness of breath. Negative for sputum production, wheezing and stridor.   Cardiovascular: Positive for chest pain. Negative for palpitations, orthopnea and PND.  Gastrointestinal: Negative for abdominal pain, diarrhea, nausea and vomiting.  Genitourinary: Negative for frequency and urgency.  Musculoskeletal: Positive for joint pain. Negative for back pain.  Neurological: Positive for weakness. Negative for sensory change, speech change and focal weakness.  Psychiatric/Behavioral: Negative for depression and hallucinations. The patient is not nervous/anxious.    Tolerating Diet:yes Tolerating PT: pending  DRUG ALLERGIES:  No Known Allergies  VITALS:  Blood pressure (!) 105/57, pulse (!) 107, temperature 97.6 F (36.4 C), temperature source Oral, resp. rate 18, height 5\' 8"  (1.727 m), weight 122 kg (268 lb 14.4 oz), SpO2 100 %.  PHYSICAL EXAMINATION:   Physical Exam  GENERAL:  82 y.o.-year-old patient lying in the bed with no acute distress.Denies any chest pain. EYES: Pupils equal, round, reactive to light and accommodation. No scleral icterus. Extraocular muscles intact.  HEENT: Head atraumatic, normocephalic. Oropharynx and nasopharynx clear.  NECK:  Supple, no jugular venous  distention. No thyroid enlargement, no tenderness.  LUNGS: decreased at bases.Patient has rales but no wheezing CARDIOVASCULAR: S1, S2 normal. Ejection systolic murmur present in aortic area., rubs, or gallops.  ABDOMEN: Soft, nontender, nondistended. Bowel sounds present. No organomegaly or mass.  EXTREMITIES: No cyanosis, clubbing or edema b/l.   Right chronic nonhealling ulcers/infection of the great toe and 5th digit s/p surgical dressing + NEUROLOGIC: Cranial nerves II through XII are intact. No focal Motor or sensory deficits b/l.   PSYCHIATRIC:  patient is alert and oriented x 3.  SKIN: No obvious rash, lesion, or ulcer.   LABORATORY PANEL:  CBC  Recent Labs Lab 02/06/16 2017  WBC 9.4  HGB 10.0*  HCT 29.5*  PLT 163    Chemistries   Recent Labs Lab 02/01/16 0518  02/04/16 0432 02/06/16 2017  NA 132*  < > 132*  --   K 4.7  < > 4.1  --   CL 95*  < > 94*  --   CO2 25  < > 32  --   GLUCOSE 300*  < > 291*  --   BUN 30*  < > 41*  --   CREATININE 1.76*  < > 1.58* 1.52*  CALCIUM 7.9*  < > 8.2*  --   MG 2.1  --   --   --   < > = values in this interval not displayed. Cardiac Enzymes  Recent Labs Lab 02/07/16 0714  TROPONINI 0.88*   RADIOLOGY:  Dg Chest 1 View  Result Date: 02/05/2016 CLINICAL DATA:  Shortness of Breath EXAM: CHEST 1 VIEW COMPARISON:  02/04/2016 FINDINGS: Left PICC line remains in place, unchanged. There is cardiomegaly. Diffuse bilateral airspace  opacities and small effusions. No significant change since prior study. No acute bony abnormality. IMPRESSION: Bilateral airspace disease and small effusions again noted. Findings likely reflect CHF. No change. Electronically Signed   By: Charlett NoseKevin  Dover M.D.   On: 02/05/2016 10:44   Dg Chest Port 1 View  Result Date: 02/06/2016 CLINICAL DATA:  Shortness of breath, chest pain. EXAM: PORTABLE CHEST 1 VIEW COMPARISON:  Radiograph of February 05, 2016. FINDINGS: Stable cardiomediastinal silhouette.  Atherosclerosis of thoracic aorta is noted. Left-sided PICC line is unchanged in position. Increased bilateral perihilar and basilar densities are noted concerning for worsening pulmonary edema or pneumonia. Mild bilateral pleural effusions are noted which are increased compared to prior exam. No pneumothorax is noted. Bony thorax is unremarkable. IMPRESSION: Increased bilateral pulmonary edema or pneumonia and pleural effusions. Electronically Signed   By: Lupita RaiderJames  Green Jr, M.D.   On: 02/06/2016 19:36   ASSESSMENT AND PLAN:  82 year old male history of insulin requiring type 2 diabetes presenting with foot ulcer.    #Chest pain, non st elevation MI: on aspirin, Plavix, discontinue the nitroglycerin, heparin drip seen by cardiology  Patient can use nitroglycerin as needed for chest pain, appreciate cardiology following on this patient.   #Acute on chronic systolic heart failureWith NSTEMI Troponins are trending up Patient is started on Lovenox 1 mg/kg subcutaneous every 12 hours On call [facility] cardiology Dr. Juliann Paresallwood called and notified patient had flash pulmonary edema 3  days ago and also yesterday evening continue oxygen, repeat chest x-ray with pulmonary edema  Patient is started on Lasix 20 mg IV every 12 hours but because of the hypotension medication was held and we'll try BiPAP as needed For non-ST elevation MI patient was in ICU for 24 hours started on heparin, nitro drip, planned to have cardiac catheter, but patient could not lie down flat and  cardiac catheterization  was not done, so we discontinued the nitro drip, heparin drip, patient was given aspirin, Plavix   # Right foot osteomyelitis: podiatry consult with Dr Alberteen Spindleline continue IV antibiotics. s/p Amputation right great toe with partial first ray resection. Excisional debridement ulceration right fifth metatarsal full-thickness to joint capsule by Dr cline;dressigns changes per podiatry  Discussed with Podiatry , dressings were  changed yesterday by Dr. Alberteen Spindleline  They have  recommend dressing changes every other  With dry sterile gauze bandages to the forefoot region and Santyl ointment and a wet-to-dry dressing to the heel ulceration area .stable for now but would most likely need to be off of his Plavix for about 5 days -MRI right foot results noted Cultures with Proteus, sensitive to ciprofloxacin- 2 weeks at d/c -Outpatient follow-up in a week   # Nausea ,poor po intake; changed his antibiotic to  IVCipro  Use zofran for nausea Consult dietary Started patient on Megace   -# Type 2 diabetes insulin requiring; . Using SSI with coverage. Lantus.;   # Hypothyroidism unspecified: synthroid  # Essential hypertension:;   # DVT prophylaxis; on aspirin, Plavix.  # Hypokalemia repleted  Chronic renal failure, CK D stage III ;renal function is improved.  D/w pt and RN and Dr. Juliann Paresallwood    Case discussed with Care Management/Social Worker. Management plans discussed with the patient, called his wife Kathie RhodesBetty for the update, her voicemail is full could not leave a message  CODE STATUS: FULL, okay with palliative care consult  DVT Prophylaxis: lovenox  TOTAL CC TIME TAKING CARE OF THIS PATIENT: 35 minutes.   POSSIBLE D/C  IN 1-2 DAYS, DEPENDING ON CLINICAL CONDITION.  Ramonita LabGouru, Jonathon Tan M.D on 02/07/2016 at 10:28 AM  Between 7am to 6pm - Pager - [phone]  After 6pm go to [email or web address] - password [email or web address] [facility]  [facility]  Office  [phone]  CC: Primary care physician; Dione Housekeeperlmedo, Mario Ernesto, MD   Note: This dictation was prepared with Dragon dictation along with smaller phrase technology. Any transcriptional errors that result from this process are unintentional.

## 2016-02-07 NOTE — Progress Notes (Signed)
Chaplain visits patient during the rounds around the unit. Patient expressed feelings of losing hope of recovering. He said that he could die anytime, but he was not too concerned because he was a born again [place]. He told chaplain that he is a member of [employer]. He said that he believes in [facility] and dying would lead him to heaven. Chaplain prayed with patient.

## 2016-02-07 NOTE — Consult Note (Signed)
Consultation Note Date: 02/07/2016   Patient Name: Jason [name]  DOB: [birth year]  MRN: [id number]  Age / Sex: 82 y.o., male  PCP: Jason Castle, MD Referring Physician: Nicholes Mango, MD  Reason for Consultation: Establishing goals of care  HPI/Patient Profile: 82 y.o. male  with past medical history of stroke, hypothyroidism, hypertension, GERD, DM, COPD, BPH, neuropathy, and past smoker admitted on 01/25/2016 from podiatry clinic for evaluation of right foot ulcer with exposed bone. Two weeks prior to admission, he had revascularization of the right lower extremity for ischemia and was sent to a nursing facility for wound care. This admit, found to have right foot osteomyelitis. Amputation of right great toe on 11/11. Patient complained of chest pain on 11/15 with elevated troponins and EKG changes. Transferred to ICU for NSTEMI-started on heparin and nitro infusions. Attempted heart cath but patient could not tolerate lying flat. CXR revealed flush pulmonary edema-was given lasix and BiPAP. Patient with hypotension so lasix doses have been held. Still wearing BiPAP intermittently. Also with poor PO intake. Palliative medicine consultation for goals of care.   Clinical Assessment and Goals of Care: Initially met with patient at bedside. Introduced palliative medicine and the support we will provide him during hospitalization. Patient off BiPAP at this time and sitting up eating lunch. Patient alert, oriented, answers questions, and following commands. He tells me he has had a poor appetite because he has been very nauseous. After conversation, asked nurse to give zofran. He has a wife and two children, one son whom lives with him.  He worked at [employer] for all of his career. He tells me this past year has been rough. He spends all day and night in the recliner, unable to sleep in his bed comfortably anymore. He  wants to stay at home but understands his wife can't care for him there.   Discussed his understanding of current medical status and my concern with multiple contributing co-morbidities. Discussed advanced directives and code status. He does not have POA paperwork or living will. Asked Jason [name] if his heart would stop and he would naturally die, would he want to be resuscitated or on life support? Also spoke of my concerns with his respiratory status and requiring BiPAP use and if he should decline, he would then require intubation. Spoke of my recommendation not to be a full code due to multiple co-morbidities and this would be more harm than good to him. Patient tells me he would NOT want to be resuscitated or on life support.   He is a spiritual individual and tells me he has been saved. He is not afraid to die, if this is his time, but is worried about leaving his wife. He becomes very tearful during the conversation.   Patient's wife, Jason [name], does not drive and is difficult for her to be at the hospital. Updated via telephone on my conversation with her husband. Introduced palliative care. Discussed current medical status and his co-morbidities. Discussed code status and his wishes on no  resuscitation. She respects this decision and agrees with DNR. They have been married for 43 years. She is hopeful for recovery but also is unsure if "he is going to make it." She doesn't want him to suffer if he should decline. Her son is able to bring her to the hospital tomorrow, 11/22 at 10am. I plan to meet with her then.    Answered questions. Offered emotional and spiritual support.   NEXT OF KIN-wife Jason [name])   SUMMARY OF RECOMMENDATIONS    Discussed code status and advanced directives with patient and wife. No advanced directives. Patient would not want to be resuscitated or put on life support-wife respects this decision. Changed to DO NOT RESUSCITATE.   Continue current medical interventions including  BiPAP, but patient would not want to be intubated if respiratory status would decline.   Wife will be at hospital tomorrow, 11/22 at 10am. I plan to meet with her.   ? Disposition- rehab with palliative to follow.   Code Status/Advance Care Planning:  DNR   Symptom Management:   Per attending  Palliative Prophylaxis:   Aspiration, Delirium Protocol, Oral Care and Turn Reposition  Additional Recommendations (Limitations, Scope, Preferences):  Full Scope Treatment-except DNR/DNI  Psycho-social/Spiritual:   Desire for further Chaplaincy support:yes  Additional Recommendations: Caregiving  Support/Resources  Prognosis:   Unable to determine  Discharge Planning: To Be Determined      Primary Diagnoses: Present on Admission: . Osteomyelitis of foot, right, acute ([place])   I have reviewed the medical record, interviewed the patient and family, and examined the patient. The following aspects are pertinent.  Past Medical History:  Diagnosis Date  . BPH (benign prostatic hyperplasia)   . Constipation   . COPD (chronic obstructive pulmonary disease) ([place])   . Diabetes mellitus without complication ([place])   . GERD (gastroesophageal reflux disease)   . HTN (hypertension)   . Hypothyroidism   . Stroke Jason [name] [place])    Social History   Social History  . Marital status: Married    Spouse name: N/A  . Number of children: N/A  . Years of education: N/A   Social History Main Topics  . Smoking status: Former Research scientist (life sciences)  . Smokeless tobacco: Never Used  . Alcohol use No  . Drug use: No  . Sexual activity: Not Asked   Other Topics Concern  . None   Social History Narrative  . None   Family History  Problem Relation Age of Onset  . Diabetes Mother   . Thyroid disease Mother   . Pancreatic cancer Father    Scheduled Meds: . aspirin EC  81 mg Oral Daily  . ciprofloxacin  500 mg Oral BID  . clopidogrel  75 mg Oral Daily  . enoxaparin (LOVENOX) injection  1 mg/kg  Subcutaneous Q12H  . famotidine  20 mg Oral Daily  . feeding supplement (GLUCERNA SHAKE)  237 mL Oral TID BM  . FLUoxetine  40 mg Oral Daily  . furosemide  20 mg Intravenous BID  . insulin aspart  0-15 Units Subcutaneous TID WC  . insulin aspart  0-5 Units Subcutaneous QHS  . insulin aspart  5 Units Subcutaneous TID WC  . insulin glargine  45 Units Subcutaneous Q supper  . levothyroxine  125 mcg Oral Q0600  . megestrol  200 mg Oral BID  . mometasone-formoterol  2 puff Inhalation BID  . nitroGLYCERIN  1 inch Topical Q6H  . pantoprazole  40 mg Oral Daily  . senna-docusate  2 tablet Oral  BID  . simvastatin  20 mg Oral QHS  . sodium chloride flush  10-40 mL Intracatheter Q12H  . tamsulosin  0.4 mg Oral Daily   Continuous Infusions: PRN Meds:.acetaminophen **OR** acetaminophen, alum & mag hydroxide-simeth, ipratropium-albuterol, morphine injection, nitroGLYCERIN, ondansetron (ZOFRAN) IV, oxyCODONE, oxyCODONE-acetaminophen, sodium chloride flush Medications Prior to Admission:  Prior to Admission medications   Medication Sig Start Date End Date Taking? Authorizing Provider  albuterol (PROVENTIL HFA;VENTOLIN HFA) 108 (90 Base) MCG/ACT inhaler Inhale 2 puffs into the lungs.  11/04/15 11/03/16 Yes Historical Provider, MD  Amino Acids-Protein Hydrolys (FEEDING SUPPLEMENT, PRO-STAT SUGAR FREE 64,) LIQD Take 30 mLs by mouth 3 (three) times daily with meals.   Yes Historical Provider, MD  aspirin 81 MG tablet Take 81 mg by mouth daily.   Yes Historical Provider, MD  budesonide-formoterol (SYMBICORT) 160-4.5 MCG/ACT inhaler Inhale 2 puffs into the lungs 2 (two) times daily.   Yes Historical Provider, MD  clopidogrel (PLAVIX) 75 MG tablet Take 1 tablet (75 mg total) by mouth daily. 01/11/16  Yes Katha Cabal, MD  docusate sodium (COLACE) 100 MG capsule Take 200 mg by mouth 2 (two) times daily.   Yes Historical Provider, MD  FLUoxetine (PROZAC) 40 MG capsule Take 40 mg by mouth daily.   Yes  Historical Provider, MD  insulin lispro (HUMALOG) 100 UNIT/ML injection Inject 20 Units into the skin. 0-150=0 151-200=2unit 201-250=4units 251-300=6 units 301-350=8 units > 351= 10 units & recheck in 2 hours   Yes Historical Provider, MD  LANTUS SOLOSTAR 100 UNIT/ML Solostar Pen Inject 90 Units into the skin daily. subQ at 1630 with dinner 06/15/14  Yes Historical Provider, MD  senna (SENOKOT) 8.6 MG TABS tablet Take 2 tablets by mouth 2 (two) times daily.   Yes Historical Provider, MD  simvastatin (ZOCOR) 20 MG tablet Take 20 mg by mouth at bedtime. 07/08/14  Yes Historical Provider, MD  SYNTHROID 125 MCG tablet Take 125 mcg by mouth daily. 07/24/14  Yes Historical Provider, MD  tamsulosin (FLOMAX) 0.4 MG CAPS capsule Take 0.4 mg by mouth daily.   Yes Historical Provider, MD  traMADol (ULTRAM) 50 MG tablet Take 50 mg by mouth 4 (four) times daily as needed.   Yes Historical Provider, MD  blood glucose meter kit and supplies KIT Dispense based on patient and insurance preference. Use up to four times daily as directed. (FOR ICD-9 250.00, 250.01). 10/15/15   Lytle Butte, MD  oxyCODONE (OXY IR/ROXICODONE) 5 MG immediate release tablet Take 1 tablet (5 mg total) by mouth every 4 (four) hours as needed for moderate pain. 10/15/15   Lytle Butte, MD  sodium chloride 0.9 % infusion Inject 250 mLs into the vein as needed (for IV line care(Saline / Heparin Lock)). 02/02/16   Teodoro Spray, MD   No Known Allergies Review of Systems  Constitutional: Positive for activity change, appetite change and fatigue.  Respiratory: Positive for shortness of breath.   Cardiovascular: Positive for chest pain.  Skin:       wound  Neurological: Positive for weakness.   Physical Exam  Constitutional: He is oriented to person, place, and time. He is cooperative. He appears ill.  Cardiovascular: Regular rhythm and normal heart sounds.   Pulmonary/Chest: Accessory muscle usage present. He has decreased breath  sounds.  Dyspnea at rest  Abdominal: Soft. Bowel sounds are normal.  Musculoskeletal: He exhibits edema (generalized).  Neurological: He is alert and oriented to person, place, and time.  Skin: Skin is warm  and dry. There is pallor.  Right toe Amputation site C/D/I  Psychiatric: He has a normal mood and affect. His speech is normal and behavior is normal. Cognition and memory are normal.  Nursing note and vitals reviewed.  Vital Signs: BP (!) 120/99 (BP Location: Right Arm)   Pulse 93   Temp 97.5 F (36.4 C) (Oral)   Resp 19   Ht 5' 8"  (1.727 m)   Wt 122 kg (268 lb 14.4 oz)   SpO2 100%   BMI 40.89 kg/m  Pain Assessment: No/denies pain POSS *See Group Information*: S-Acceptable,Sleep, easy to arouse Pain Score: Asleep   SpO2: SpO2: 100 % O2 Device:SpO2: 100 % O2 Flow Rate: .O2 Flow Rate (L/min): 4 L/min  IO: Intake/output summary:   Intake/Output Summary (Last 24 hours) at 02/07/16 1712 Last data filed at 02/07/16 6962  Gross per 24 hour  Intake                0 ml  Output              200 ml  Net             -200 ml    LBM: Last BM Date: 02/05/16 Baseline Weight: Weight: 117.8 kg (259 lb 12.8 oz) Most recent weight: Weight: 122 kg (268 lb 14.4 oz)     Palliative Assessment/Data: PPS 30%   Flowsheet Rows   Flowsheet Row Most Recent Value  Intake Tab  Referral Department  Hospitalist  Unit at Time of Referral  Med/Surg Unit  Palliative Care Primary Diagnosis  Pulmonary  Date Notified  02/07/16  Palliative Care Type  New Palliative care  Reason for referral  Clarify Goals of Care  Date of Admission  01/25/16  Date first seen by Palliative Care  02/07/16  # of days Palliative referral response time  0 Day(s)  # of days IP prior to Palliative referral  13  Clinical Assessment  Palliative Performance Scale Score  30%  Psychosocial & Spiritual Assessment  Palliative Care Outcomes  Patient/Family meeting held?  Yes  Who was at the meeting?  patient at bedside.  Spoke with wife via telephone  Palliative Care Outcomes  Clarified goals of care, Provided psychosocial or spiritual support, Changed CPR status, Provided advance care planning      Time In: 1240 Time Out: 1400 Time Total: 55mn Greater than 50%  of this time was spent counseling and coordinating care related to the above assessment and plan.  Signed by:  MIhor Dow FNP-C Palliative Medicine Team  Phone: [phone]: [phone]  Please contact Palliative Medicine Team phone at [phone] questions and concerns.  For individual provider: See AShea Evans

## 2016-02-07 NOTE — Progress Notes (Signed)
Pt. Was taken off bipap and placed back on 4 L nasal cannula.

## 2016-02-07 NOTE — Progress Notes (Signed)
Initial Nutrition Assessment  DOCUMENTATION CODES:   Severe malnutrition in context of acute illness/injury  INTERVENTION:  1. Magic cup TID with meals, each supplement provides 290 kcal and 9 grams of protein 2. Kozy Shack Pudding BID 3. Glucerna Shake po TID, each supplement provides 220 kcal and 10 grams of protein 4. Megace BID per MD  NUTRITION DIAGNOSIS:   Malnutrition related to acute illness as evidenced by energy intake < or equal to 50% for > or equal to 5 days, percent weight loss. -ongonig  GOAL:   Patient will meet greater than or equal to 90% of their needs -not progressing  MONITOR:   PO intake, Supplement acceptance, I & O's, Labs, Weight trends  REASON FOR ASSESSMENT:   Malnutrition Screening Tool    ASSESSMENT:   Jason [name]  is a 82 y.o. male with a known history of type 2 diabetes, insulin requiring who is presenting from podiatry clinic for further management of right foot ulcer.   Jason [name] did not eat any of his lunch tray Meal completion documented: 0% past 6 meals Per RN he is also refusing meds Met with palliative today - does not desire to pass away, but states he is ok with it at this point Suspect patient is vying for control vs refusing meds/food to related to psychological issues or wanting to die. He was open to trying chocolate pudding, apparently he likes the orange magic cup but when I visited the one his tray had not been touched. Will monitor. Labs and medications reviewed: CBGs 216-224, Na 132 Senokot-S  Diet Order:  Diet regular Room service appropriate? Yes; Fluid consistency: Thin  Skin:  Wound (see comment) (osteomyelitis of R foot)  Last BM:  11/16  Height:   Ht Readings from Last 1 Encounters:  02/02/16 _0  (1.727 m)    Weight:   Wt Readings from Last 1 Encounters:  02/04/16 268 lb 14.4 oz (122 kg)    Ideal Body Weight:  70 kg  BMI:  Body mass index is 40.89 kg/m.  Estimated Nutritional Needs:    Kcal:  2000-2450 calories (25-30 cal/kg ABW)  Protein:  117-140 gm  Fluid:  >/= 2L  EDUCATION NEEDS:   No education needs identified at this time  Jason Anis. Jason Prill, MS, RD LDN Inpatient Clinical Dietitian Pager [phone]

## 2016-02-07 NOTE — Progress Notes (Signed)
Dr. Amado CoeGouru notified of elevated troponin and episode of chest pain this morning during shift change. MD to speak with Dr. Lady GaryFath, no new orders at this time.

## 2016-02-07 NOTE — Care Management (Signed)
Patient is requiring intermittent bipap and there is concern he may require continuous. Palliative care consult has been placed.

## 2016-02-07 NOTE — Progress Notes (Signed)
Patient has rested quietly today. No complaints. Seems to be feeling better and breathing a little easier after being on Bipap for a few hours earlier today, agreed to try to wear tonight. Seen by palliative and dietician today. Will continue to monitor.  

## 2016-02-07 NOTE — Progress Notes (Signed)
Inpatient Diabetes Program Recommendations  AACE/ADA: New Consensus Statement on Inpatient Glycemic Control (2015)  Target Ranges:  Prepandial:   less than 140 mg/dL      Peak postprandial:   less than 180 mg/dL (1-2 hours)      Critically ill patients:  140 - 180 mg/dL   Lab Results  Component Value Date   GLUCAP 213 (H) 02/07/2016   HGBA1C 6.7 (H) 01/25/2016    Review of Glycemic Control:  Results for Jason [name], Rogan L (MRN [id number]) as of 02/07/2016 09:25  Ref. Range 02/06/2016 11:29 02/06/2016 16:46 02/06/2016 21:30 02/07/2016 07:35  Glucose-Capillary Latest Ref Range: 65 - 99 mg/dL 409248 (H) 811224 (H) 914261 (H) 213 (H)   Home DM Meds: Lantus 90 units QPM Humalog 20 units TIDWC Humalog SSI  Current Insulin Orders: Lantus 40units QPM Novolog Moderate Correction Scale/ SSI (0-15 units) TID AC + HS  Recommendations:   Please consider increasing Lantus to 45 units daily and add Novolog meal coverage 5 units tid with meals.  Thanks, Beryl MeagerJenny Zidan Helget, RN, BC-ADM Inpatient Diabetes Coordinator Pager [phone] (8a-5p)

## 2016-02-07 NOTE — Progress Notes (Signed)
PT Cancellation Note  Patient Details Name: Jason [name] [place] MRN: [id number] DOB: 03-May-1932   Cancelled Treatment:    Reason Eval/Treat Not Completed: Patient declined, no reason specified;Medical issues which prohibited therapy;Fatigue/lethargy limiting ability to participate. Spoke with nursing prior to attempted treatment due to ongoing medical issues (upward trending Troponin levels, nausea/vomitting, chest pains and poor oral intake). Nursing states therapist may attempt bed level exercises. Initial attempt, pt with palliative services. Second attempt, pt refuses due to not feeling well. Re attempt tomorrow as medical issues allow.    Scot DockHeidi E Barnes, PTA 02/07/2016, 2:18 PM

## 2016-02-08 DIAGNOSIS — R079 Chest pain, unspecified: Secondary | ICD-10-CM

## 2016-02-08 DIAGNOSIS — Z7189 Other specified counseling: Secondary | ICD-10-CM

## 2016-02-08 DIAGNOSIS — Z515 Encounter for palliative care: Secondary | ICD-10-CM

## 2016-02-08 DIAGNOSIS — R110 Nausea: Secondary | ICD-10-CM

## 2016-02-08 LAB — BASIC METABOLIC PANEL WITH GFR
Anion gap: 8 (ref 5–15)
BUN: 49 mg/dL — ABNORMAL HIGH (ref 6–20)
CO2: 33 mmol/L — ABNORMAL HIGH (ref 22–32)
Calcium: 8.2 mg/dL — ABNORMAL LOW (ref 8.9–10.3)
Chloride: 96 mmol/L — ABNORMAL LOW (ref 101–111)
Creatinine, Ser: 1.61 mg/dL — ABNORMAL HIGH (ref 0.61–1.24)
GFR calc Af Amer: 45 mL/min — ABNORMAL LOW (ref >60)
GFR calc non Af Amer: 38 mL/min — ABNORMAL LOW (ref >60)
Glucose, Bld: 224 mg/dL — ABNORMAL HIGH (ref 65–99)
Potassium: 4.1 mmol/L (ref 3.5–5.1)
Sodium: 137 mmol/L (ref 135–145)

## 2016-02-08 LAB — CBC
HEMATOCRIT: 30.9 % — AB (ref 40.0–52.0)
HEMOGLOBIN: 10.2 g/dL — AB (ref 13.0–18.0)
MCH: 28.7 pg (ref 26.0–34.0)
MCHC: 33 g/dL (ref 32.0–36.0)
MCV: 87 fL (ref 80.0–100.0)
Platelets: 171 10*3/uL (ref 150–440)
RBC: 3.55 MIL/uL — AB (ref 4.40–5.90)
RDW: 15.6 % — ABNORMAL HIGH (ref 11.5–14.5)
WBC: 13.1 10*3/uL — ABNORMAL HIGH (ref 3.8–10.6)

## 2016-02-08 LAB — GLUCOSE, CAPILLARY
GLUCOSE-CAPILLARY: 214 mg/dL — AB (ref 65–99)
GLUCOSE-CAPILLARY: 140 mg/dL — AB (ref 65–99)
Glucose-Capillary: 194 mg/dL — ABNORMAL HIGH (ref 65–99)
Glucose-Capillary: 112 mg/dL — ABNORMAL HIGH (ref 65–99)

## 2016-02-08 NOTE — Therapy (Signed)
Called to patient room by nurse.  Patient requesting  BiPAP due to SOB. Patient reports improved respiratory effort and relief of SOB.

## 2016-02-08 NOTE — Progress Notes (Signed)
[facility] [place] [facility]   PATIENT NAME: Jason [name]    MRN#:  [id number]  DATE OF BIRTH:  1932/08/11  SUBJECTIVE:  Hospital Day: 14 days Jason [name] is a 82 y.o. male presenting with .Foot pain, wound  Overnight events: No acute overnight events Interval Events: Continues to complain of being tired, shortness of breath denies any pain fevers chills    REVIEW OF SYSTEMS:  CONSTITUTIONAL: No fever, fatigue or weakness.  EYES: No blurred or double vision.  EARS, NOSE, AND THROAT: No tinnitus or ear pain.  RESPIRATORY: No cough, shortness of breath, wheezing or hemoptysis.  CARDIOVASCULAR: No chest pain, orthopnea, edema.  GASTROINTESTINAL: No nausea, vomiting, diarrhea or abdominal pain.  GENITOURINARY: No dysuria, hematuria.  ENDOCRINE: No polyuria, nocturia,  HEMATOLOGY: No anemia, easy bruising or bleeding SKIN: No rash or lesion. MUSCULOSKELETAL: No joint pain or arthritis.   NEUROLOGIC: No tingling, numbness, weakness.  PSYCHIATRY: No anxiety or depression.   DRUG ALLERGIES:  No Known Allergies  VITALS:  Blood pressure (!) 126/58, pulse 98, temperature 97.8 F (36.6 C), temperature source Oral, resp. rate 18, height 5\' 8"  (1.727 m), weight 122 kg (268 lb 14.4 oz), SpO2 97 %.  PHYSICAL EXAMINATION:  VITAL SIGNS: Vitals:   02/08/16 0814 02/08/16 1136  BP: (!) 104/45 (!) 126/58  Pulse: 100 98  Resp: 20 18  Temp: 97.5 F (36.4 C) 97.8 F (36.6 C)   GENERAL:83 y.o.male currently in no acute distress.  HEAD: Normocephalic, atraumatic.  EYES: Pupils equal, round, reactive to light. Extraocular muscles intact. No scleral icterus.  MOUTH: Moist mucosal membrane. Dentition intact. No abscess noted.  EAR, NOSE, THROAT: Clear without exudates. No external lesions.  NECK: Supple. No thyromegaly. No nodules. No JVD.  PULMONARY: Clear to ascultation, without wheeze rails or rhonci. No use of accessory muscles, Good respiratory effort.  good air entry bilaterally CHEST: Nontender to palpation.  CARDIOVASCULAR: S1 and S2. Regular rate and rhythm. No murmurs, rubs, or gallops. No edema. Pedal pulses 2+ bilaterally.  GASTROINTESTINAL: Soft, nontender, nondistended. No masses. Positive bowel sounds. No hepatosplenomegaly.  MUSCULOSKELETAL: No swelling, clubbing, or edema. Range of motion full in all extremities.  NEUROLOGIC: Cranial nerves II through XII are intact. No gross focal neurological deficits. Sensation intact. Reflexes intact.  SKIN: No ulceration, lesions, rashes, or cyanosis. Skin warm and dry. Turgor intact.  PSYCHIATRIC: Mood, affect within normal limits. The patient is awake, alert and oriented x 3. Insight, judgment intact.      LABORATORY PANEL:   CBC  Recent Labs Lab 02/08/16 0511  WBC 13.1*  HGB 10.2*  HCT 30.9*  PLT 171   ------------------------------------------------------------------------------------------------------------------  Chemistries   Recent Labs Lab 02/08/16 0511  NA 137  K 4.1  CL 96*  CO2 33*  GLUCOSE 224*  BUN 49*  CREATININE 1.61*  CALCIUM 8.2*   ------------------------------------------------------------------------------------------------------------------  Cardiac Enzymes  Recent Labs Lab 02/07/16 0714  TROPONINI 0.88*   ------------------------------------------------------------------------------------------------------------------  RADIOLOGY:  Dg Chest 1 View  Result Date: 02/07/2016 CLINICAL DATA:  82 year old male with aspiration. Shortness of breath and chest pain. Initial encounter. EXAM: CHEST 1 VIEW COMPARISON:  01/2016 and earlier. FINDINGS: Portable AP upright view at 2022 hours. Confluent retrocardiac opacity is not significantly changed. Superimposed veiling lung base opacity and widespread bilateral upper lobe interstitial opacity also appears stable. No pneumothorax. Stable left PICC line. Stable cardiac size and mediastinal contours.  Calcified aortic atherosclerosis. Visualized tracheal air column is within normal limits. IMPRESSION: Stable  ventilation since yesterday: Bilateral lower lobe collapse or consolidation, superimposed widespread bilateral pulmonary interstitial opacity, and possible small pleural effusions. Electronically Signed   By: Odessa FlemingH  Hall M.D.   On: 02/07/2016 20:43   Dg Chest Port 1 View  Result Date: 02/06/2016 CLINICAL DATA:  Shortness of breath, chest pain. EXAM: PORTABLE CHEST 1 VIEW COMPARISON:  Radiograph of February 05, 2016. FINDINGS: Stable cardiomediastinal silhouette. Atherosclerosis of thoracic aorta is noted. Left-sided PICC line is unchanged in position. Increased bilateral perihilar and basilar densities are noted concerning for worsening pulmonary edema or pneumonia. Mild bilateral pleural effusions are noted which are increased compared to prior exam. No pneumothorax is noted. Bony thorax is unremarkable. IMPRESSION: Increased bilateral pulmonary edema or pneumonia and pleural effusions. Electronically Signed   By: Lupita RaiderJames  Green Jr, M.D.   On: 02/06/2016 19:36    EKG:   Orders placed or performed during the hospital encounter of 01/25/16  . EKG 12-Lead  . EKG 12-Lead  . EKG 12-Lead  . EKG 12-Lead  . EKG 12-Lead  . EKG 12-Lead  . EKG 12-Lead  . EKG 12-Lead    ASSESSMENT AND PLAN:   Jason [name] is a 82 y.o. male presenting with Foot wound.  Admitted 01/25/2016 : Day #: 14 days,  underwent surgery 11/11-amputation right great toe with excisional debridement Patient developed chest pain 11/13 Desaturations, respiratory failure followed 11/15: Recurrent chest pain started on heparin and nitroglycerin transferred to ICU-NSTEMI 11/16: Plan cardiac catheterization patient is unable to lay flat secondary to hypoxia-cardiac catheterization deferred 11/17: Transferred out of ICU, heparin drip stopped Since that time has been essentially dealing with acute on chronic diastolic congestive heart  failure but has made minimal progress   1. Acute on chronic diastolic congestive heart failure: Appreciate cardiology input continue diuresis as blood pressure and renal function tolerates. I'm rather concerned this would develop into cardiac renal syndrome if he has worsening kidney function within the next day or so consult nephrology. Patient had Nitropaste-we'll discontinue this hopefully would give us more blood pressure to continue with diuresis  2. NSTEMI: Cardiac catheterization has been deferred as above, now on setting of worsening renal function it remains  a poor decision, continue aspirin and statin therapy on Plavix, patient is still on Lovenox for up trending cardiac enzymes followed carefully, cardiology input appreciated  3. Acute kidney injury: Monitor carefully as I do suspect this will worsen 4. Type 2 diabetes: Insulin sliding-scale insulin sliding 5. Right foot osteomyelitis: Has completed course of antibiotics   All the records are reviewed and case discussed with Care Management/Social Workerr. Management plans discussed with the patient, family and they are in agreement.  CODE STATUS: dnr TOTAL TIME TAKING CARE OF THIS PATIENT: 33 minutes.   POSSIBLE D/C IN 2-3DAYS, DEPENDING ON CLINICAL CONDITION.   Careena Degraffenreid,  Mardi MainlandDavid K M.D on 02/08/2016 at 1:29 PM  Between 7am to 6pm - Pager - [phone]  After 6pm: House Pager: [phone]  [facility]  Office  [phone]  CC: Primary care physician; Dione Housekeeperlmedo, Mario Ernesto, MD

## 2016-02-08 NOTE — Progress Notes (Signed)
Pt seen by me in room for Fath . He states improved sob. I am worried about OSA and COPD as etiology of recurrent Resp failure. Demand ischemia is also possible I recommend continued diuretic therapy and continued aggressive pulmonary therapy Krystelle Prashad

## 2016-02-08 NOTE — Progress Notes (Signed)
PT Cancellation Note  Patient Details Name: Jason [name] MRN: [id number] DOB: Aug 08, 1932   Cancelled Treatment:    Reason Eval/Treat Not Completed: Patient declined, no reason specified. Treatment attempted; pt refuses stating he is maybe feeling a little better, but not able to tolerate PT. Spouse encourages pt noting MD was just in for a visit and informed pt he needs to participate with PT. Nursing assistant in to provide bath for pt; offered to re attempt in a little while as time permits.    Scot DockHeidi E Cornel Werber, PTA 02/08/2016, 12:53 PM

## 2016-02-08 NOTE — Clinical Social Work Note (Addendum)
MSW spoke to [facility] in [place] [phone] admissions, they can not accept patient till Monday due to not having any beds available.  MSW to continue to follow patient's progress throughout discharge planning.  Ervin KnackEric R. Hassan Rowannterhaus, MSW [phone]  Mon-Fri 8a-4:30p 02/08/2016 6:02 PM

## 2016-02-08 NOTE — Consult Note (Signed)
ANTICOAGULATION CONSULT NOTE - Initial Consult  Pharmacy Consult for lovenox Indication: chest pain/ACS  No Known Allergies  Patient Measurements: Height: 5\' 8"  (172.7 cm) Weight: 268 lb 14.4 oz (122 kg) IBW/kg (Calculated) : 68.4 Heparin Dosing Weight:   Vital Signs: Temp: 97.5 F (36.4 C) (11/22 0814) Temp Source: Oral (11/22 0814) BP: 104/45 (11/22 0814) Pulse Rate: 100 (11/22 0814)  Labs:  Recent Labs  02/06/16 2017 02/07/16 0233 02/07/16 0714 02/08/16 0511  HGB 10.0*  --   --  10.2*  HCT 29.5*  --   --  30.9*  PLT 163  --   --  171  CREATININE 1.52*  --   --  1.61*  TROPONINI 0.78* 0.83* 0.88*  --     Estimated Creatinine Clearance: 45.7 mL/min (by C-G formula based on SCr of 1.61 mg/dL (H)).   Medical History: Past Medical History:  Diagnosis Date  . BPH (benign prostatic hyperplasia)   . Constipation   . COPD (chronic obstructive pulmonary disease) (HCC)   . Diabetes mellitus without complication (HCC)   . GERD (gastroesophageal reflux disease)   . HTN (hypertension)   . Hypothyroidism   . Stroke [facility](HCC)     Medications:  Scheduled:  . aspirin EC  81 mg Oral Daily  . ciprofloxacin  500 mg Oral BID  . clopidogrel  75 mg Oral Daily  . enoxaparin (LOVENOX) injection  1 mg/kg Subcutaneous Q12H  . famotidine  20 mg Oral Daily  . feeding supplement (GLUCERNA SHAKE)  237 mL Oral TID BM  . FLUoxetine  40 mg Oral Daily  . furosemide  20 mg Intravenous BID  . insulin aspart  0-15 Units Subcutaneous TID WC  . insulin aspart  0-5 Units Subcutaneous QHS  . insulin aspart  5 Units Subcutaneous TID WC  . insulin glargine  45 Units Subcutaneous Q supper  . levothyroxine  125 mcg Oral Q0600  . megestrol  200 mg Oral BID  . mometasone-formoterol  2 puff Inhalation BID  . nitroGLYCERIN  1 inch Topical Q6H  . pantoprazole  40 mg Oral Daily  . senna-docusate  2 tablet Oral BID  . simvastatin  20 mg Oral QHS  . sodium chloride flush  10-40 mL Intracatheter Q12H   . tamsulosin  0.4 mg Oral Daily    Assessment: Pt is a 82 year old male who is 4 days post op of amputation. Patient is complaining of chest pain, SOB. Pt received prophylatic dose of lovenox at 1735 this evening. Pharmacy is consulted to dose lovenox for possible ACS or PE.   Goal of Therapy:   Monitor platelets by anticoagulation protocol: Yes   Plan:  Continue enoxaparin 120mg  q12h.   Delsa BernKelly m Natale Barba, PharmD 02/08/2016,10:38 AM

## 2016-02-08 NOTE — Progress Notes (Signed)
Daily Progress Note   Patient Name: Jason [name]       Date: 02/08/2016 DOB: [birth year]  Age: 82 y.o. MRN#: [id number] Attending Physician: Jason Hasteavid K Hower, MD Primary Care Physician: Jason [name], Jason Ernesto, MD Admit Date: 01/25/2016  Reason for Consultation/Follow-up: Establishing goals of care  Subjective: This NP had a family meeting with patient, wife Jason [name](Jason [name]), and son Jason [name](Jason [name]). Introduced palliative care and the support we will provide throughout hospitalization. Patient alert, oriented, answers questions, and follows commands. Denies pain or discomfort but did have some chest pain during the night. He tells me he wore the BiPAP most of the night.   Wife and son did not have a good understanding of current medical status. Discussed in detail current medical diagnoses along with chronic co-morbidities that may be contributing to his decline. Discussed many concerns, including pulmonary edema and lasix that has been held due hypotension, caution with giving lasix with worsening kidney function, need for BiPAP, and elevated troponins/continued chest pain. Son spoke that he has thought about sending his dad to another hospital because he doesn't know if we are doing everything possible here. Discussed in detail medical interventions that have been done throughout current hospitalization.    Spoke of the "what if" things don't turn around and he continues to decline? Patient tells me again he would not want to be resuscitated or on life support, and family understands his wishes. Patient becomes very tearful stating if he did not show improvements despite aggressive medical interventions, he would want to go home. Wife asks if this would be possible. Briefly discussed that he may be eligible for home  hospice services, meaning no escalation of care. Patient speaks of [place] but he is afraid of leaving his family if this is his time to go.   Answered questions and concerns. Provided emotional and spiritual support.   Length of Stay: 14  Current Medications: Scheduled Meds:  . aspirin EC  81 mg Oral Daily  . ciprofloxacin  500 mg Oral BID  . clopidogrel  75 mg Oral Daily  . enoxaparin (LOVENOX) injection  1 mg/kg Subcutaneous Q12H  . famotidine  20 mg Oral Daily  . feeding supplement (GLUCERNA SHAKE)  237 mL Oral TID BM  . FLUoxetine  40 mg Oral Daily  . furosemide  20 mg Intravenous BID  . insulin aspart  0-15 Units Subcutaneous TID WC  . insulin aspart  0-5 Units Subcutaneous QHS  . insulin aspart  5 Units Subcutaneous TID WC  . insulin glargine  45 Units Subcutaneous Q supper  . levothyroxine  125 mcg Oral Q0600  . megestrol  200 mg Oral BID  . mometasone-formoterol  2 puff Inhalation BID  . pantoprazole  40 mg Oral Daily  . senna-docusate  2 tablet Oral BID  . simvastatin  20 mg Oral QHS  . sodium chloride flush  10-40 mL Intracatheter Q12H  . tamsulosin  0.4 mg Oral Daily    Continuous Infusions:  PRN Meds: acetaminophen **OR** acetaminophen, alum & mag hydroxide-simeth, ipratropium-albuterol, morphine injection, nitroGLYCERIN, ondansetron (ZOFRAN) IV, oxyCODONE, oxyCODONE-acetaminophen, sodium chloride flush  Physical Exam  Constitutional: He is oriented to person, place, and time. He is cooperative.  Cardiovascular: Regular rhythm.  Exam reveals distant heart sounds.   Pulmonary/Chest: Accessory muscle usage present. He has decreased breath sounds.  Dyspnea at rest  Abdominal: Soft. Bowel sounds are normal. There is no tenderness.  Neurological: He is alert and oriented to person, place, and time.  Skin: Skin is warm and dry. There is pallor.  Right great toe amputation. Dressing C/D/I  Psychiatric: He has a normal mood and affect. His speech is  normal and behavior is normal. Cognition and memory are normal.  Nursing note and vitals reviewed.          Vital Signs: BP (!) 126/58 (BP Location: Right Arm)   Pulse 98   Temp 97.8 F (36.6 C) (Oral)   Resp 18   Ht 5\' 8"  (1.727 m)   Wt 122 kg (268 lb 14.4 oz)   SpO2 97%   BMI 40.89 kg/m  SpO2: SpO2: 97 % O2 Device: O2 Device: Nasal Cannula O2 Flow Rate: O2 Flow Rate (L/min): 4 L/min  Intake/output summary:   Intake/Output Summary (Last 24 hours) at 02/08/16 1318 Last data filed at 02/08/16 1006  Gross per 24 hour  Intake                0 ml  Output                0 ml  Net                0 ml   LBM: Last BM Date: 02/07/16 Baseline Weight: Weight: 117.8 kg (259 lb 12.8 oz) Most recent weight: Weight: 122 kg (268 lb 14.4 oz)    Palliative Assessment/Data: PPS 30%   Flowsheet Rows   Flowsheet Row Most Recent Value  Intake Tab  Referral Department  Hospitalist  Unit at Time of Referral  Med/Surg Unit  Palliative Care Primary Diagnosis  Pulmonary  Date Notified  02/07/16  Palliative Care Type  New Palliative care  Reason for referral  Clarify Goals of Care  Date of Admission  01/25/16  Date first seen by Palliative Care  02/07/16  # of days Palliative referral response time  0 Day(s)  # of days IP prior to Palliative referral  13  Clinical Assessment  Palliative Performance Scale Score  30%  Psychosocial & Spiritual Assessment  Palliative Care Outcomes  Patient/Family meeting held?  Yes  Who was at the meeting?  patient, wife, son Jason [name])  Palliative Care Outcomes  Clarified goals of care, Provided psychosocial or spiritual support, Provided advance care planning      Patient Active Problem List   Diagnosis  Date Noted  . Palliative care encounter   . Goals of care, counseling/discussion   . DNR (do not resuscitate) discussion   . Nausea without vomiting   . Elevated troponin I level 01/30/2016  . Osteomyelitis of foot, right, acute (HCC) 01/25/2016  .  Hyperlipidemia 01/02/2016  . PAD (peripheral artery disease) (HCC) 01/02/2016  . Skin ulcer of right foot with necrosis of muscle (HCC) 01/02/2016  . COPD (chronic obstructive pulmonary disease) (HCC) 10/13/2015  . GERD (gastroesophageal reflux disease) 10/13/2015  . Hypothyroidism 10/13/2015  . BPH (benign prostatic hyperplasia) 10/13/2015  . Diabetes (HCC) 10/13/2015  . Open toe fracture 10/13/2015  . Pressure ulcer 10/13/2015    Palliative Care Assessment & Plan   Patient Profile: 82 y.o. male  with past medical history of stroke, hypothyroidism, hypertension, GERD, DM, COPD, BPH, neuropathy, and past smoker admitted on 01/25/2016 from podiatry clinic for evaluation of right foot ulcer with exposed bone. Two weeks prior to admission, he had revascularization of the right lower extremity for ischemia and was sent to a nursing facility for wound care. This admit, found to have right foot osteomyelitis. Amputation of right great toe on 11/11. Patient complained of chest pain on 11/15 with elevated troponins and EKG changes. Transferred to ICU for NSTEMI-started on heparin and nitro infusions. Attempted heart cath but patient could not tolerate lying flat. CXR revealed flush pulmonary edema-was given lasix and BiPAP. Patient with hypotension so lasix doses have been held. Still wearing BiPAP intermittently. Also with poor PO intake. Palliative medicine consultation for goals of care.   Assessment: Right great toe ulceration s/p amputation Right food osteomyelitis Acute on chronic CHF  Pulmonary edema NSTEMI Nausea Chronic kidney disease stage III Hx of COPD Hx of DM  Recommendations/Plan:  DNR/DNI. Continue current plan of care, including BiPAP.   PMT will continue to follow patient during hospitalization, relay information to family, and offer recommendations as needed.   Goals of Care and Additional Recommendations:  Limitations on Scope of Treatment: Full Scope Treatment-except  DNR/DNI  Code Status: DNR   Code Status Orders        Start     Ordered   02/07/16 1426  Do not attempt resuscitation (DNR)  Continuous    Question Answer Comment  In the event of cardiac or respiratory ARREST Do not call a "code blue"   In the event of cardiac or respiratory ARREST Do not perform Intubation, CPR, defibrillation or ACLS   In the event of cardiac or respiratory ARREST Use medication by any route, position, wound care, and other measures to relive pain and suffering. May use oxygen, suction and manual treatment of airway obstruction as needed for comfort.      02/07/16 1425    Code Status History    Date Active Date Inactive Code Status Order ID Comments User Context   01/25/2016  5:35 PM 02/07/2016  2:25 PM Full Code [id number]  Jason Hasteavid K Hower, MD Inpatient   01/10/2016  2:23 PM 01/10/2016  7:23 PM Full Code [id number]  Renford DillsGregory G Schnier, MD Inpatient   10/13/2015  1:49 AM 10/15/2015  8:16 PM Full Code [id number]  Oralia Manisavid Willis, MD Inpatient       Prognosis:   Unable to determine  Discharge Planning:  To Be Determined  Care plan was discussed with patient, wife, son, RN, and Dr. Clint GuyHower  Thank you for allowing the Palliative Medicine Team to assist in the care of this patient.   Time In: 1000 Time  Out: 1045 Total Time Prolonged Time Billed  no       Greater than 50%  of this time was spent counseling and coordinating care related to the above assessment and plan.  Vennie Homans, FNP-C Palliative Medicine Team  Phone: [phone] Fax: [phone]  Please contact Palliative Medicine Team phone at [phone] for questions and concerns.

## 2016-02-09 ENCOUNTER — Inpatient Hospital Stay: Payer: Medicare Other

## 2016-02-09 LAB — GLUCOSE, CAPILLARY
GLUCOSE-CAPILLARY: 120 mg/dL — AB (ref 65–99)
GLUCOSE-CAPILLARY: 162 mg/dL — AB (ref 65–99)
GLUCOSE-CAPILLARY: 228 mg/dL — AB (ref 65–99)
Glucose-Capillary: 79 mg/dL (ref 65–99)

## 2016-02-09 LAB — BASIC METABOLIC PANEL
ANION GAP: 7 (ref 5–15)
BUN: 44 mg/dL — AB (ref 6–20)
CHLORIDE: 97 mmol/L — AB (ref 101–111)
CO2: 34 mmol/L — ABNORMAL HIGH (ref 22–32)
Calcium: 8.4 mg/dL — ABNORMAL LOW (ref 8.9–10.3)
Creatinine, Ser: 1.63 mg/dL — ABNORMAL HIGH (ref 0.61–1.24)
GFR calc Af Amer: 44 mL/min — ABNORMAL LOW (ref >60)
GFR, EST NON AFRICAN AMERICAN: 38 mL/min — AB (ref >60)
Glucose, Bld: 75 mg/dL (ref 65–99)
POTASSIUM: 3.6 mmol/L (ref 3.5–5.1)
SODIUM: 138 mmol/L (ref 135–145)

## 2016-02-09 LAB — CBC
HCT: 30.7 % — ABNORMAL LOW (ref 40.0–52.0)
HEMOGLOBIN: 10.3 g/dL — AB (ref 13.0–18.0)
MCH: 28.7 pg (ref 26.0–34.0)
MCHC: 33.6 g/dL (ref 32.0–36.0)
MCV: 85.4 fL (ref 80.0–100.0)
Platelets: 155 10*3/uL (ref 150–440)
RBC: 3.6 MIL/uL — AB (ref 4.40–5.90)
RDW: 15.3 % — ABNORMAL HIGH (ref 11.5–14.5)
WBC: 9.6 10*3/uL (ref 3.8–10.6)

## 2016-02-09 MED ORDER — FUROSEMIDE 10 MG/ML IJ SOLN
40.0000 mg | Freq: Two times a day (BID) | INTRAMUSCULAR | Status: DC
  Administered 2016-02-09 – 2016-02-11 (×4): 40 mg via INTRAVENOUS
  Filled 2016-02-09 (×4): qty 4

## 2016-02-09 MED ORDER — FUROSEMIDE 10 MG/ML IJ SOLN
20.0000 mg | Freq: Once | INTRAMUSCULAR | Status: AC
  Administered 2016-02-09: 20 mg via INTRAVENOUS
  Filled 2016-02-09: qty 2

## 2016-02-09 MED ORDER — INSULIN GLARGINE 100 UNIT/ML ~~LOC~~ SOLN
35.0000 [IU] | Freq: Every day | SUBCUTANEOUS | Status: DC
  Administered 2016-02-09 – 2016-02-11 (×3): 35 [IU] via SUBCUTANEOUS
  Filled 2016-02-09 (×4): qty 0.35

## 2016-02-09 MED ORDER — MIRTAZAPINE 15 MG PO TBDP
15.0000 mg | ORAL_TABLET | Freq: Every day | ORAL | Status: DC
  Administered 2016-02-09 – 2016-02-10 (×2): 15 mg via ORAL
  Filled 2016-02-09 (×3): qty 1

## 2016-02-09 MED ORDER — COLLAGENASE 250 UNIT/GM EX OINT
TOPICAL_OINTMENT | CUTANEOUS | Status: DC
  Administered 2016-02-09 – 2016-02-13 (×3): via TOPICAL
  Filled 2016-02-09: qty 30

## 2016-02-09 MED ORDER — HEPARIN SODIUM (PORCINE) 5000 UNIT/ML IJ SOLN
5000.0000 [IU] | Freq: Three times a day (TID) | INTRAMUSCULAR | Status: DC
  Administered 2016-02-09 – 2016-02-14 (×14): 5000 [IU] via SUBCUTANEOUS
  Filled 2016-02-09 (×16): qty 1

## 2016-02-09 NOTE — Progress Notes (Signed)
Skin tear on right elbow bleed most of the day.  Dressing changes and ice applied and and off the and hour.  This slowed the bleeding.  A pressure dressing was applied for an hour, which seems to have finally stopped it.  Would has a mepatel, transparent dressing and a foam on top.

## 2016-02-09 NOTE — Progress Notes (Signed)
Physical Therapy Treatment Patient Details Name: Jason HerculesMyron L [name] MRN: [id number] DOB: 10-Sep-1932 Today's Date: 02/09/2016    History of Present Illness Pt is a 82 year old male with osteomylitis of the foot here for amuptation of toe. Patient is s/p right great toe amputation with debridement on 01/28/16; He was at short term rehab facility prior to admittance where he was getting care for last few weeks. Pt was transferred to the CCU but is now back on the floor. Pt is AOx1 at time of evaluation. History felt to be unreliable    PT Comments    Patient seen for follow up treatment. He has not attempted standing or weightbearing in almost 2 weeks, offered to allow him to try standing/weightbearing which he declined. He was able to sit briefly ~3 minutes, prior to fatiguing. He showed limited stamina throughout supine exercise program. Given his overall trajectory he may be more appropriate for 2-6x/wk status as he is not progressing as hoped with weightbearing and OOB mobility.   Follow Up Recommendations  SNF     Equipment Recommendations       Recommendations for Other Services       Precautions / Restrictions Precautions Precautions: Fall Required Braces or Orthoses: Other Brace/Splint Other Brace/Splint: Post-op shoe on RLE Restrictions Weight Bearing Restrictions: Yes RLE Weight Bearing: Weight bearing as tolerated Other Position/Activity Restrictions: Must wear post-op shoe    Mobility  Bed Mobility Overal bed mobility: Needs Assistance Bed Mobility: Rolling;Supine to Sit;Sit to Supine Rolling: Max assist;+2 for physical assistance   Supine to sit: HOB elevated;Mod assist;Max assist Sit to supine: HOB elevated;Mod assist   General bed mobility comments: patient required trendelenburg and +2 assist to slide up in bed. For sitting transfer, patient required significant assistance for trunk and LEs   Transfers                 General transfer comment: Patient  declined attempt at standing.   Ambulation/Gait                 Stairs            Wheelchair Mobility    Modified Rankin (Stroke Patients Only)       Balance Overall balance assessment: Needs assistance Sitting-balance support: Bilateral upper extremity supported Sitting balance-Leahy Scale: Fair Sitting balance - Comments: Patient demonstrated no leaning, but fatigued quickly with sitting ~ 3-5 minutes.                             Cognition Arousal/Alertness: Awake/alert (Feels tired) Behavior During Therapy: WFL for tasks assessed/performed Overall Cognitive Status: Within Functional Limits for tasks assessed                      Exercises General Exercises - Lower Extremity Short Arc Quad: AROM;Left;10 reps Long Arc Quad: AROM;Both;5 reps Heel Slides: AROM;Both;10 reps Hip ABduction/ADduction: AROM;Supine;Both;10 reps Straight Leg Raises: AAROM;10 reps;Supine;Both    General Comments        Pertinent Vitals/Pain Pain Assessment:  (Patient does not indicate any pain in this session)    Home Living                      Prior Function            PT Goals (current goals can now be found in the care plan section) Acute Rehab PT Goals Patient Stated Goal: Patient reports  feeling tired and unable to try weightbearing at this time.  PT Goal Formulation: With patient Time For Goal Achievement: 02/18/16 Potential to Achieve Goals: Poor Progress towards PT goals: Progressing toward goals    Frequency    7X/week      PT Plan Current plan remains appropriate    Co-evaluation             End of Session Equipment Utilized During Treatment: Oxygen Activity Tolerance: Patient limited by fatigue;Patient limited by lethargy Patient left: in bed;with call bell/phone within reach;with nursing/sitter in room     Time: [id number] PT Time Calculation (min) (ACUTE ONLY): 15 min  Charges:  $Therapeutic Exercise: 8-22  mins                    G Codes:      Kerin RansomPatrick A McNamara, PT, DPT    02/09/2016, 11:05 AM

## 2016-02-09 NOTE — Progress Notes (Signed)
Right foot dressing changed.  Santyl and wet gauze applied to heel.  Dry gauze applied to anterior surface and around great toe.  Bleeding from under the graft over where the toe was with any pressure to the site.

## 2016-02-09 NOTE — Progress Notes (Signed)
[facility] [place] [facility]   PATIENT NAME: Jason SchneidersMyron Panameno    MRN#:  [id number]  DATE OF BIRTH:  May 09, 1932  SUBJECTIVE:  Hospital Day: 15 days Jason [name] is a 82 y.o. male presenting with .Foot pain, wound  Overnight events: Shortness of breath requiring BiPAP Interval Events: Continues to complain of being tired, shortness of breath denies any pain fevers chills    REVIEW OF SYSTEMS:  CONSTITUTIONAL: No fever, fatigue or weakness.  EYES: No blurred or double vision.  EARS, NOSE, AND THROAT: No tinnitus or ear pain.  RESPIRATORY: No cough, Positive shortness of breath, denies wheezing or hemoptysis.  CARDIOVASCULAR: No chest pain, orthopnea, edema.  GASTROINTESTINAL: No nausea, vomiting, diarrhea or abdominal pain.  GENITOURINARY: No dysuria, hematuria.  ENDOCRINE: No polyuria, nocturia,  HEMATOLOGY: No anemia, easy bruising or bleeding SKIN: No rash or lesion. MUSCULOSKELETAL: No joint pain or arthritis.   NEUROLOGIC: No tingling, numbness, weakness.  PSYCHIATRY: No anxiety or depression.   DRUG ALLERGIES:  No Known Allergies  VITALS:  Blood pressure (!) 104/47, pulse (!) 101, temperature 98.7 F (37.1 C), temperature source Oral, resp. rate 13, height 5\' 8"  (1.727 m), weight 122 kg (268 lb 14.4 oz), SpO2 99 %.  PHYSICAL EXAMINATION:  VITAL SIGNS: Vitals:   02/09/16 0808 02/09/16 0843  BP: (!) 104/47   Pulse: 92 (!) 101  Resp: 13   Temp: 98.7 F (37.1 C)    GENERAL:83 y.o.male currently in no acute distress.  HEAD: Normocephalic, atraumatic.  EYES: Pupils equal, round, reactive to light. Extraocular muscles intact. No scleral icterus.  MOUTH: Moist mucosal membrane. Dentition intact. No abscess noted.  EAR, NOSE, THROAT: Clear without exudates. No external lesions.  NECK: Supple. No thyromegaly. No nodules. No JVD.  PULMONARY: Greatly diminished breath sounds without wheeze rails or rhonci. No use of accessory muscles, Good  respiratory effort. good air entry bilaterally CHEST: Nontender to palpation.  CARDIOVASCULAR: S1 and S2. Regular rate and rhythm. No murmurs, rubs, or gallops. No edema. Pedal pulses 2+ bilaterally.  GASTROINTESTINAL: Soft, nontender, nondistended. No masses. Positive bowel sounds. No hepatosplenomegaly.  MUSCULOSKELETAL: No swelling, clubbing, or edema. Range of motion full in all extremities.  NEUROLOGIC: Cranial nerves II through XII are intact. No gross focal neurological deficits. Sensation intact. Reflexes intact.  SKIN: No ulceration, lesions, rashes, or cyanosis. Skin warm and dry. Turgor intact.  PSYCHIATRIC: Mood, affect within normal limits. The patient is awake, alert and oriented x 3. Insight, judgment intact.      LABORATORY PANEL:   CBC  Recent Labs Lab 02/09/16 0830  WBC 9.6  HGB 10.3*  HCT 30.7*  PLT 155   ------------------------------------------------------------------------------------------------------------------  Chemistries   Recent Labs Lab 02/09/16 0830  NA 138  K 3.6  CL 97*  CO2 34*  GLUCOSE 75  BUN 44*  CREATININE 1.63*  CALCIUM 8.4*   ------------------------------------------------------------------------------------------------------------------  Cardiac Enzymes  Recent Labs Lab 02/07/16 0714  TROPONINI 0.88*   ------------------------------------------------------------------------------------------------------------------  RADIOLOGY:  Dg Chest 1 View  Result Date: 02/07/2016 CLINICAL DATA:  82 year old male with aspiration. Shortness of breath and chest pain. Initial encounter. EXAM: CHEST 1 VIEW COMPARISON:  01/2016 and earlier. FINDINGS: Portable AP upright view at 2022 hours. Confluent retrocardiac opacity is not significantly changed. Superimposed veiling lung base opacity and widespread bilateral upper lobe interstitial opacity also appears stable. No pneumothorax. Stable left PICC line. Stable cardiac size and  mediastinal contours. Calcified aortic atherosclerosis. Visualized tracheal air column is within normal limits.  IMPRESSION: Stable ventilation since yesterday: Bilateral lower lobe collapse or consolidation, superimposed widespread bilateral pulmonary interstitial opacity, and possible small pleural effusions. Electronically Signed   By: Odessa FlemingH  Hall M.D.   On: 02/07/2016 20:43   Dg Chest Port 1 View  Result Date: 02/09/2016 CLINICAL DATA:  Worsening shortness of breath. Former smoker. History of hypertension, GERD, diabetes and COPD. EXAM: PORTABLE CHEST 1 VIEW COMPARISON:  02/07/2016; 02/05/2016; 02/02/2016; 01/30/2016 FINDINGS: Grossly unchanged enlarged cardiac silhouette and mediastinal contours. Atherosclerotic plaque within the thoracic aorta. Stable position of support apparatus. The pulmonary vasculature is indistinct with cephalization of flow. Unchanged small layering bilateral effusions and associated bibasilar heterogeneous/consolidative opacities, left greater than right. No pneumothorax. No acute osseus abnormalities. IMPRESSION: Similar findings of pulmonary edema, small bilateral effusions and associated bibasilar opacities, left greater than right, atelectasis versus infiltrate. Electronically Signed   By: Simonne ComeJohn  Watts M.D.   On: 02/09/2016 10:42    EKG:   Orders placed or performed during the hospital encounter of 01/25/16  . EKG 12-Lead  . EKG 12-Lead  . EKG 12-Lead  . EKG 12-Lead  . EKG 12-Lead  . EKG 12-Lead  . EKG 12-Lead  . EKG 12-Lead    ASSESSMENT AND PLAN:   Jason [name] is a 82 y.o. male presenting with Foot wound.  Admitted 01/25/2016 : Day #: 15 days,  underwent surgery 11/11-amputation right great toe with excisional debridement Patient developed chest pain 11/13 Desaturations, respiratory failure followed 11/15: Recurrent chest pain started on heparin and nitroglycerin transferred to ICU-NSTEMI 11/16: Plan cardiac catheterization patient is unable to lay flat  secondary to hypoxia-cardiac catheterization deferred 11/17: Transferred out of ICU, heparin drip stopped Since that time has been essentially dealing with acute on chronic diastolic congestive heart failure but has made minimal progress     1. Acute on chronic diastolic congestive heart failure: Appreciate cardiology input continue diuresis as blood pressure and renal function tolerates. I'm rather concerned this would develop into cardiac renal syndrome if he has worsening kidney function within the next day or so consult nephrology.  Blood pressure allowing will increase Lasix to 40 mg  2. NSTEMI: Cardiac catheterization has been deferred as above, now on setting of worsening renal function it remains  a poor decision, continue aspirin and statin therapy on Plavix, stopped therapeutic Lovenox  3. Acute kidney injury: Monitor carefully as I do suspect this will worsen 4. Type 2 diabetes: Insulin sliding-scale insulin sliding 5. Right foot osteomyelitis: Has completed course of antibiotics   All the records are reviewed and case discussed with Care Management/Social Workerr. Management plans discussed with the patient, family and they are in agreement.  CODE STATUS: dnr TOTAL TIME TAKING CARE OF THIS PATIENT: 33 minutes.   POSSIBLE D/C IN 2-3DAYS, DEPENDING ON CLINICAL CONDITION.   Kevante Lunt,  Mardi MainlandDavid K M.D on 02/09/2016 at 11:59 AM  Between 7am to 6pm - Pager - [phone]  After 6pm: House Pager: - [phone]  [facility]  Office  [phone]  CC: Primary care physician; Dione Housekeeperlmedo, Mario Ernesto, MD

## 2016-02-10 LAB — BASIC METABOLIC PANEL
Anion gap: 7 (ref 5–15)
BUN: 39 mg/dL — AB (ref 6–20)
CO2: 36 mmol/L — AB (ref 22–32)
Calcium: 8.1 mg/dL — ABNORMAL LOW (ref 8.9–10.3)
Chloride: 95 mmol/L — ABNORMAL LOW (ref 101–111)
Creatinine, Ser: 1.52 mg/dL — ABNORMAL HIGH (ref 0.61–1.24)
GFR calc Af Amer: 48 mL/min — ABNORMAL LOW (ref >60)
GFR, EST NON AFRICAN AMERICAN: 41 mL/min — AB (ref >60)
GLUCOSE: 144 mg/dL — AB (ref 65–99)
POTASSIUM: 3.5 mmol/L (ref 3.5–5.1)
Sodium: 138 mmol/L (ref 135–145)

## 2016-02-10 LAB — GLUCOSE, CAPILLARY
GLUCOSE-CAPILLARY: 234 mg/dL — AB (ref 65–99)
GLUCOSE-CAPILLARY: 235 mg/dL — AB (ref 65–99)
GLUCOSE-CAPILLARY: 251 mg/dL — AB (ref 65–99)
Glucose-Capillary: 150 mg/dL — ABNORMAL HIGH (ref 65–99)

## 2016-02-10 LAB — CBC
HCT: 28.2 % — ABNORMAL LOW (ref 40.0–52.0)
Hemoglobin: 9.3 g/dL — ABNORMAL LOW (ref 13.0–18.0)
MCH: 28.3 pg (ref 26.0–34.0)
MCHC: 33 g/dL (ref 32.0–36.0)
MCV: 85.7 fL (ref 80.0–100.0)
PLATELETS: 121 10*3/uL — AB (ref 150–440)
RBC: 3.29 MIL/uL — AB (ref 4.40–5.90)
RDW: 15.4 % — AB (ref 11.5–14.5)
WBC: 7.2 10*3/uL (ref 3.8–10.6)

## 2016-02-10 NOTE — Progress Notes (Signed)
[facility] [place] [facility]   PATIENT NAME: Jason SchneidersMyron [name]    MRN#:  [id number]  DATE OF BIRTH:  1932-11-18  SUBJECTIVE:  Hospital Day: 16 days Jason [name] is a 82 y.o. [name] presenting with .Foot pain, wound  Overnight events: Shortness of breath requiring BiPAP Interval Events: Continues to complain of being tired, shortness of breath denies any pain fevers chills    REVIEW OF SYSTEMS:  CONSTITUTIONAL: No fever, fatigue or weakness.  EYES: No blurred or double vision.  EARS, NOSE, AND THROAT: No tinnitus or ear pain.  RESPIRATORY: No cough, Positive shortness of breath, denies wheezing or hemoptysis.  CARDIOVASCULAR: No chest pain, orthopnea, edema.  GASTROINTESTINAL: No nausea, vomiting, diarrhea or abdominal pain.  GENITOURINARY: No dysuria, hematuria.  ENDOCRINE: No polyuria, nocturia,  HEMATOLOGY: No anemia, easy bruising or bleeding SKIN: No rash or lesion. MUSCULOSKELETAL: No joint pain or arthritis.   NEUROLOGIC: No tingling, numbness, weakness.  PSYCHIATRY: No anxiety or depression.   DRUG ALLERGIES:  No Known Allergies  VITALS:  Blood pressure (!) 99/36, pulse 94, temperature 97.8 F (36.6 C), temperature source Oral, resp. rate 20, height 5\' 8"  (1.727 m), weight 118.3 kg (260 lb 14.4 oz), SpO2 98 %.  PHYSICAL EXAMINATION:  VITAL SIGNS: Vitals:   02/10/16 0815 02/10/16 1120  BP: (!) 110/39 (!) 99/36  Pulse: 95 94  Resp:  20  Temp: 98.1 F (36.7 C) 97.8 F (36.6 C)   GENERAL:83 y.o.[name] currently in no acute distress.  HEAD: Normocephalic, atraumatic.  EYES: Pupils equal, round, reactive to light. Extraocular muscles intact. No scleral icterus.  MOUTH: Moist mucosal membrane. Dentition intact. No abscess noted.  EAR, NOSE, THROAT: Clear without exudates. No external lesions.  NECK: Supple. No thyromegaly. No nodules. No JVD.  PULMONARY: Greatly diminished breath sounds without wheeze rails or rhonci. No use of accessory  muscles, Good respiratory effort. good air entry bilaterally CHEST: Nontender to palpation.  CARDIOVASCULAR: S1 and S2. Regular rate and rhythm. No murmurs, rubs, or gallops. No edema. Pedal pulses 2+ bilaterally.  GASTROINTESTINAL: Soft, nontender, nondistended. No masses. Positive bowel sounds. No hepatosplenomegaly.  MUSCULOSKELETAL: No swelling, clubbing, or edema. Range of motion full in all extremities.  NEUROLOGIC: Cranial nerves II through XII are intact. No gross focal neurological deficits. Sensation intact. Reflexes intact.  SKIN: No ulceration, lesions, rashes, or cyanosis. Skin warm and dry. Turgor intact.  PSYCHIATRIC: Mood, affect within normal limits. The patient is awake, alert and oriented x 3. Insight, judgment intact.      LABORATORY PANEL:   CBC  Recent Labs Lab 02/10/16 1230  WBC 7.2  HGB 9.3*  HCT 28.2*  PLT 121*   ------------------------------------------------------------------------------------------------------------------  Chemistries   Recent Labs Lab 02/10/16 0532  NA 138  K 3.5  CL 95*  CO2 36*  GLUCOSE 144*  BUN 39*  CREATININE 1.52*  CALCIUM 8.1*   ------------------------------------------------------------------------------------------------------------------  Cardiac Enzymes  Recent Labs Lab 02/07/16 0714  TROPONINI 0.88*   ------------------------------------------------------------------------------------------------------------------  RADIOLOGY:  Dg Chest Port 1 View  Result Date: 02/09/2016 CLINICAL DATA:  Worsening shortness of breath. Former smoker. History of hypertension, GERD, diabetes and COPD. EXAM: PORTABLE CHEST 1 VIEW COMPARISON:  02/07/2016; 02/05/2016; 02/02/2016; 01/30/2016 FINDINGS: Grossly unchanged enlarged cardiac silhouette and mediastinal contours. Atherosclerotic plaque within the thoracic aorta. Stable position of support apparatus. The pulmonary vasculature is indistinct with cephalization of flow.  Unchanged small layering bilateral effusions and associated bibasilar heterogeneous/consolidative opacities, left greater than right. No pneumothorax. No acute  osseus abnormalities. IMPRESSION: Similar findings of pulmonary edema, small bilateral effusions and associated bibasilar opacities, left greater than right, atelectasis versus infiltrate. Electronically Signed   By: Simonne ComeJohn  Watts M.D.   On: 02/09/2016 10:42    EKG:   Orders placed or performed during the hospital encounter of 01/25/16  . EKG 12-Lead  . EKG 12-Lead  . EKG 12-Lead  . EKG 12-Lead  . EKG 12-Lead  . EKG 12-Lead  . EKG 12-Lead  . EKG 12-Lead    ASSESSMENT AND PLAN:   Jason [name] is a 82 y.o. [name] presenting with Foot wound.  Admitted 01/25/2016 : Day #: 16 days,  underwent surgery 11/11-amputation right great toe with excisional debridement Patient developed chest pain 11/13 Desaturations, respiratory failure followed 11/15: Recurrent chest pain started on heparin and nitroglycerin transferred to ICU-NSTEMI 11/16: Plan cardiac catheterization patient is unable to lay flat secondary to hypoxia-cardiac catheterization deferred 11/17: Transferred out of ICU, heparin drip stopped Since that time has been essentially dealing with acute on chronic diastolic congestive heart failure but has made minimal progress     1. Acute on chronic diastolic congestive heart failure:Tolerated Lasix 40 mg twice a day continue, also continue oxygen and breathing treatments as required BiPAP at nighttime if needed  2. NSTEMI: Cardiac catheterization has been deferred as above, now on setting of worsening renal function it remains  a poor decision, continue aspirin and statin therapy on Plavix, stopped therapeutic Lovenox  3. Acute kidney injury: Monitor carefully as I do suspect this will worsen 4. Type 2 diabetes: Insulin sliding-scale insulin sliding 5. Right foot osteomyelitis: Has completed course of antibiotics   All the records  are reviewed and case discussed with Care Management/Social Workerr. Management plans discussed with the patient, family and they are in agreement.  CODE STATUS: dnr TOTAL TIME TAKING CARE OF THIS PATIENT: 28 minutes.   POSSIBLE D/C IN 2-3DAYS, DEPENDING ON CLINICAL CONDITION.   Ivet Guerrieri,  Mardi MainlandDavid K M.D on 02/10/2016 at 2:09 PM  Between 7am to 6pm - Pager - [phone]  After 6pm: House Pager: - [phone]  [facility]  Office  [phone]  CC: Primary care physician; Dione Housekeeperlmedo, Mario Ernesto, MD

## 2016-02-10 NOTE — Progress Notes (Signed)
PT Cancellation Note  Patient Details Name: Jason [name] MRN: [id number] DOB: 02-28-1933   Cancelled Treatment:    Reason Eval/Treat Not Completed: Patient declined, no reason specified.  Pt refused PT d/t being nauseas (nursing notified).  Based on prior therapy notes and PT note yesterday ("Given his overall trajectory he may be more appropriate for 2-6x/wk status as he is not progressing as hoped with weightbearing and OOB mobility"), will update PT frequency to 2-6x/week.  Will re-attempt PT treatment at a later date/time.    Irving Burtonmily Oz Gammel 02/10/2016, 8:55 AM Hendricks LimesEmily Laquinn Shippy, PT [phone]

## 2016-02-10 NOTE — Care Management Important Message (Signed)
Important Message  Patient Details  Name: Almon HerculesMyron L Cicalese MRN: [id number] Date of Birth: March 20, 1932   Medicare Important Message Given:  Yes    Lonia Roane A, RN 02/10/2016, 7:55 AM

## 2016-02-10 NOTE — Progress Notes (Signed)
Daily Progress Note   Patient Name: Jason [name]       Date: 02/13/2016 DOB: January 04, 1933  Age: 82 y.o. MRN#: [id number] Attending Physician: Enedina FinnerSona Patel, MD Primary Care Physician: Dione Housekeeperlmedo, Mario Ernesto, MD Admit Date: 01/25/2016  Reason for Consultation/Follow-up: Establishing goals of care  Subjective: Patient sleeping. Updated wife via telephone. Patient still requiring intermittent BiPAP. Still with poor PO intake. Physical therapy has attempted to work with him many times and he has declined. Wife tearful during conversation. Answered questions and offered emotional support.   Length of Stay: 19  Current Medications: Scheduled Meds:  . aspirin EC  81 mg Oral Daily  . clopidogrel  75 mg Oral Daily  . collagenase   Topical 2 days  . famotidine  20 mg Oral Daily  . feeding supplement (GLUCERNA SHAKE)  237 mL Oral TID BM  . FLUoxetine  40 mg Oral Daily  . furosemide  20 mg Intravenous BID  . heparin subcutaneous  5,000 Units Subcutaneous Q8H  . insulin aspart  0-15 Units Subcutaneous TID WC  . insulin aspart  0-5 Units Subcutaneous QHS  . insulin aspart  5 Units Subcutaneous TID WC  . insulin glargine  40 Units Subcutaneous Q supper  . levothyroxine  125 mcg Oral Q0600  . megestrol  200 mg Oral BID  . midodrine  5 mg Oral TID WC  . mometasone-formoterol  2 puff Inhalation BID  . pantoprazole  40 mg Oral Daily  . senna-docusate  2 tablet Oral BID  . simvastatin  20 mg Oral QHS  . sodium chloride flush  10-40 mL Intracatheter Q12H  . tamsulosin  0.4 mg Oral Daily    Continuous Infusions:  PRN Meds: acetaminophen **OR** acetaminophen, alum & mag hydroxide-simeth, ipratropium-albuterol, nitroGLYCERIN, ondansetron (ZOFRAN) IV, oxyCODONE, oxyCODONE-acetaminophen, sodium chloride  flush  Physical Exam  Constitutional: He is oriented to person, place, and time. He is cooperative.  Cardiovascular: Regular rhythm.  Exam reveals distant heart sounds.   Pulmonary/Chest: Accessory muscle usage present. He has decreased breath sounds.  Dyspnea at rest  Abdominal: Soft. Bowel sounds are normal. There is no tenderness.  Neurological: He is alert and oriented to person, place, and time.  Skin: Skin is warm and dry. There is pallor.  Right great toe amputation. Dressing C/D/I  Psychiatric: He has a  normal mood and affect. His speech is normal and behavior is normal. Cognition and memory are normal.  Nursing note and vitals reviewed.          Vital Signs: BP (!) 104/35 (BP Location: Right Arm)   Pulse 92   Temp 97.7 F (36.5 C) (Oral)   Resp 18   Ht 5\' 8"  (1.727 m)   Wt 117.3 kg (258 lb 11.2 oz)   SpO2 99%   BMI 39.34 kg/m  SpO2: SpO2: 99 % O2 Device: O2 Device: Nasal Cannula O2 Flow Rate: O2 Flow Rate (L/min): 4 L/min  Intake/output summary:   Intake/Output Summary (Last 24 hours) at 02/13/16 0806 Last data filed at 02/13/16 0442  Gross per 24 hour  Intake                0 ml  Output              250 ml  Net             -250 ml   LBM: Last BM Date: 02/10/16 Baseline Weight: Weight: 117.8 kg (259 lb 12.8 oz) Most recent weight: Weight: 117.3 kg (258 lb 11.2 oz)    Palliative Assessment/Data: PPS 30%   Flowsheet Rows   Flowsheet Row Most Recent Value  Intake Tab  Referral Department  Hospitalist  Unit at Time of Referral  Med/Surg Unit  Palliative Care Primary Diagnosis  Pulmonary  Date Notified  02/07/16  Palliative Care Type  New Palliative care  Reason for referral  Clarify Goals of Care  Date of Admission  01/25/16  Date first seen by Palliative Care  02/07/16  # of days Palliative referral response time  0 Day(s)  # of days IP prior to Palliative referral  13  Clinical Assessment  Palliative Performance Scale Score  30%  Psychosocial &  Spiritual Assessment  Palliative Care Outcomes  Patient/Family meeting held?  Yes  Who was at the meeting?  updated wife via telephone  Palliative Care Outcomes  Provided psychosocial or spiritual support, Clarified goals of care      Patient Active Problem List   Diagnosis Date Noted  . Palliative care encounter   . Goals of care, counseling/discussion   . Encounter for hospice care discussion   . Nausea without vomiting   . Chest pain   . Elevated troponin I level 01/30/2016  . Osteomyelitis of foot, right, acute (HCC) 01/25/2016  . Hyperlipidemia 01/02/2016  . PAD (peripheral artery disease) (HCC) 01/02/2016  . Skin ulcer of right foot with necrosis of muscle (HCC) 01/02/2016  . COPD (chronic obstructive pulmonary disease) (HCC) 10/13/2015  . GERD (gastroesophageal reflux disease) 10/13/2015  . Hypothyroidism 10/13/2015  . BPH (benign prostatic hyperplasia) 10/13/2015  . Diabetes (HCC) 10/13/2015  . Open toe fracture 10/13/2015  . Pressure ulcer 10/13/2015    Palliative Care Assessment & Plan   Patient Profile: 82 y.o. male  with past medical history of stroke, hypothyroidism, hypertension, GERD, DM, COPD, BPH, neuropathy, and past smoker admitted on 01/25/2016 from podiatry clinic for evaluation of right foot ulcer with exposed bone. Two weeks prior to admission, he had revascularization of the right lower extremity for ischemia and was sent to a nursing facility for wound care. This admit, found to have right foot osteomyelitis. Amputation of right great toe on 11/11. Patient complained of chest pain on 11/15 with elevated troponins and EKG changes. Transferred to ICU for NSTEMI-started on heparin and nitro infusions. Attempted heart cath but  patient could not tolerate lying flat. CXR revealed flush pulmonary edema-was given lasix and BiPAP. Patient with hypotension so lasix doses have been held. Still wearing BiPAP intermittently. Also with poor PO intake. Palliative medicine  consultation for goals of care.   Assessment: Right great toe ulceration s/p amputation Right food osteomyelitis Acute on chronic CHF  Pulmonary edema NSTEMI Nausea Chronic kidney disease stage III Hx of COPD Hx of DM  Recommendations/Plan:  DNR/DNI. Continue current plan of care, including BiPAP.   PMT will continue to follow patient during hospitalization, relay information to family, and offer recommendations as needed.   Goals of Care and Additional Recommendations:  Limitations on Scope of Treatment: Full Scope Treatment-except DNR/DNI  Code Status: DNR   Code Status Orders        Start     Ordered   02/07/16 1426  Do not attempt resuscitation (DNR)  Continuous    Question Answer Comment  In the event of cardiac or respiratory ARREST Do not call a "code blue"   In the event of cardiac or respiratory ARREST Do not perform Intubation, CPR, defibrillation or ACLS   In the event of cardiac or respiratory ARREST Use medication by any route, position, wound care, and other measures to relive pain and suffering. May use oxygen, suction and manual treatment of airway obstruction as needed for comfort.      02/07/16 1425    Code Status History    Date Active Date Inactive Code Status Order ID Comments User Context   01/25/2016  5:35 PM 02/07/2016  2:25 PM Full Code [id number]  Wyatt Hasteavid K Hower, MD Inpatient   01/10/2016  2:23 PM 01/10/2016  7:23 PM Full Code [id number]  Renford DillsGregory G Schnier, MD Inpatient   10/13/2015  1:49 AM 10/15/2015  8:16 PM Full Code [id number]  Oralia Manisavid Willis, MD Inpatient       Prognosis:   Unable to determine  Discharge Planning:  To Be Determined  Care plan was discussed with patient, wife, RN, and Dr. Clint GuyHower  Thank you for allowing the Palliative Medicine Team to assist in the care of this patient.   Time In: 1420 Time Out: 1445 Total Time 25min Prolonged Time Billed  no       Greater than 50%  of this time was spent counseling and coordinating  care related to the above assessment and plan.  Vennie HomansMegan Crystin Lechtenberg, FNP-C Palliative Medicine Team  Phone: [phone] Fax: [phone]  Please contact Palliative Medicine Team phone at [phone] for questions and concerns.

## 2016-02-10 NOTE — Progress Notes (Signed)
Patient observed with bleeding from right elbow skin tear this morning,  Vaseline gauze, covered with gauze and wrapped with Kerlix, dressing applied. No further bleeding noted. Will continue to monitor. Attending made aware of low diastolic b/p reading, no new order will continue to monitor. Patient stable at this time.

## 2016-02-11 LAB — BASIC METABOLIC PANEL
ANION GAP: 6 (ref 5–15)
BUN: 43 mg/dL — ABNORMAL HIGH (ref 6–20)
CHLORIDE: 95 mmol/L — AB (ref 101–111)
CO2: 36 mmol/L — ABNORMAL HIGH (ref 22–32)
Calcium: 8 mg/dL — ABNORMAL LOW (ref 8.9–10.3)
Creatinine, Ser: 1.62 mg/dL — ABNORMAL HIGH (ref 0.61–1.24)
GFR calc non Af Amer: 38 mL/min — ABNORMAL LOW (ref >60)
GFR, EST AFRICAN AMERICAN: 44 mL/min — AB (ref >60)
Glucose, Bld: 282 mg/dL — ABNORMAL HIGH (ref 65–99)
POTASSIUM: 3.4 mmol/L — AB (ref 3.5–5.1)
SODIUM: 137 mmol/L (ref 135–145)

## 2016-02-11 LAB — GLUCOSE, CAPILLARY
GLUCOSE-CAPILLARY: 301 mg/dL — AB (ref 65–99)
GLUCOSE-CAPILLARY: 279 mg/dL — AB (ref 65–99)
GLUCOSE-CAPILLARY: 299 mg/dL — AB (ref 65–99)
Glucose-Capillary: 286 mg/dL — ABNORMAL HIGH (ref 65–99)

## 2016-02-11 MED ORDER — FUROSEMIDE 10 MG/ML IJ SOLN
20.0000 mg | Freq: Two times a day (BID) | INTRAMUSCULAR | Status: DC
  Administered 2016-02-12 – 2016-02-13 (×3): 20 mg via INTRAVENOUS
  Filled 2016-02-11 (×5): qty 2

## 2016-02-11 NOTE — Progress Notes (Signed)
MD aware of patient blood pressure of 89/40, lasix decreased to 20mg . Patient asymptomatic, will continue to monitor.

## 2016-02-11 NOTE — Progress Notes (Signed)
Pox on 4l 97%

## 2016-02-11 NOTE — Progress Notes (Signed)
[facility] [place] [facility]   PATIENT NAME: Jason [name]    MRN#:  [id number]  DATE OF BIRTH:  April 06, 1932  SUBJECTIVE:  Hospital Day: 17 days Jason [name] is a 82 y.o. male presenting with .Foot pain, wound  Overnight events: no overnight events Interval Events: no complaints, 4L [place]    REVIEW OF SYSTEMS:  CONSTITUTIONAL: No fever,positive fatigue or weakness.  EYES: No blurred or double vision.  EARS, NOSE, AND THROAT: No tinnitus or ear pain.  RESPIRATORY: No cough, Positive shortness of breath, denies wheezing or hemoptysis.  CARDIOVASCULAR: No chest pain, orthopnea, edema.  GASTROINTESTINAL: No nausea, vomiting, diarrhea or abdominal pain.  GENITOURINARY: No dysuria, hematuria.  ENDOCRINE: No polyuria, nocturia,  HEMATOLOGY: No anemia, easy bruising or bleeding SKIN: No rash or lesion. MUSCULOSKELETAL: No joint pain or arthritis.   NEUROLOGIC: No tingling, numbness, weakness.  PSYCHIATRY: No anxiety or depression.   DRUG ALLERGIES:  No Known Allergies  VITALS:  Blood pressure (!) 105/44, pulse 98, temperature 98.2 F (36.8 C), temperature source Oral, resp. rate 16, height 5\' 8"  (1.727 m), weight 118.3 kg (260 lb 14.4 oz), SpO2 97 %.  PHYSICAL EXAMINATION:  VITAL SIGNS: Vitals:   02/11/16 0328 02/11/16 0741  BP: (!) 104/43 (!) 105/44  Pulse: (!) 101 98  Resp: 16   Temp:  98.2 F (36.8 C)   GENERAL:83 y.o.male currently in no acute distress. Appears more lethargic today HEAD: Normocephalic, atraumatic.  EYES: Pupils equal, round, reactive to light. Extraocular muscles intact. No scleral icterus.  MOUTH: Moist mucosal membrane. Dentition intact. No abscess noted.  EAR, NOSE, THROAT: Clear without exudates. No external lesions.  NECK: Supple. No thyromegaly. No nodules. No JVD.  PULMONARY: Greatly diminished breath sounds without wheeze rails or rhonci. No use of accessory muscles, Good respiratory effort. good air entry  bilaterally CHEST: Nontender to palpation.  CARDIOVASCULAR: S1 and S2. Regular rate and rhythm. No murmurs, rubs, or gallops. No edema. Pedal pulses 2+ bilaterally.  GASTROINTESTINAL: Soft, nontender, nondistended. No masses. Positive bowel sounds. No hepatosplenomegaly.  MUSCULOSKELETAL: No swelling, clubbing, or edema. Range of motion full in all extremities.  NEUROLOGIC: Cranial nerves II through XII are intact. No gross focal neurological deficits. Sensation intact. Reflexes intact.  SKIN: No ulceration, lesions, rashes, or cyanosis. Skin warm and dry. Turgor intact.  PSYCHIATRIC: Mood, affect within normal limits. The patient is awake, alert and oriented x 3. Insight, judgment intact.      LABORATORY PANEL:   CBC  Recent Labs Lab 02/10/16 1230  WBC 7.2  HGB 9.3*  HCT 28.2*  PLT 121*   ------------------------------------------------------------------------------------------------------------------  Chemistries   Recent Labs Lab 02/11/16 0830  NA 137  [name] 3.4*  CL 95*  CO2 36*  GLUCOSE 282*  BUN 43*  CREATININE 1.62*  CALCIUM 8.0*   ------------------------------------------------------------------------------------------------------------------  Cardiac Enzymes  Recent Labs Lab 02/07/16 0714  TROPONINI 0.88*   ------------------------------------------------------------------------------------------------------------------  RADIOLOGY:  No results found.  EKG:   Orders placed or performed during the hospital encounter of 01/25/16  . EKG 12-Lead  . EKG 12-Lead  . EKG 12-Lead  . EKG 12-Lead  . EKG 12-Lead  . EKG 12-Lead  . EKG 12-Lead  . EKG 12-Lead    ASSESSMENT AND PLAN:   Jason [name] is a 82 y.o. male presenting with Foot wound.  Admitted 01/25/2016 : Day #: 17 days,  underwent surgery 11/11-amputation right great toe with excisional debridement Patient developed chest pain 11/13 Desaturations, respiratory failure followed  11/15: Recurrent  chest pain started on heparin and nitroglycerin transferred to ICU-NSTEMI 11/16: Plan cardiac catheterization patient is unable to lay flat secondary to hypoxia-cardiac catheterization deferred 11/17: Transferred out of ICU, heparin drip stopped Since that time has been essentially dealing with acute on chronic diastolic congestive heart failure but has made minimal progress     1. Acute on chronic diastolic congestive heart failure:Tolerated Lasix 40 mg twice a day continue, also continue oxygen and breathing treatments as required BiPAP at nighttime if needed  2. NSTEMI: Cardiac catheterization has been deferred as above, now on setting of worsening renal function it remains  a poor decision, continue aspirin and statin therapy on Plavix, stopped therapeutic Lovenox  3. Acute kidney injury: slight worsening will consult nephrology 4. Type 2 diabetes: Insulin sliding-scale insulin sliding 5. Right foot osteomyelitis: Has completed course of antibiotics   Overall poor prognosis, not really progressing, not working with PT, palliative care involved Will dc remeron for mental statis  All the records are reviewed and case discussed with Care Management/Social Workerr. Management plans discussed with the patient, family and they are in agreement.  CODE STATUS: dnr TOTAL TIME TAKING CARE OF THIS PATIENT: 33 minutes.   POSSIBLE D/C IN 2-3DAYS, DEPENDING ON CLINICAL CONDITION.   Hower,  Jason [name] M.D on 02/11/2016 at 12:18 PM  Between 7am to 6pm - Pager - [phone]  After 6pm: House Pager: - [phone]  [facility]  Office  [phone]  CC: Primary care physician; Dione Housekeeperlmedo, Jason Ernesto, MD

## 2016-02-11 NOTE — Progress Notes (Signed)
Attending aware of blood pressure of 90/51, new order to hold lasix. Not given.

## 2016-02-11 NOTE — Progress Notes (Signed)
Patient c/o difficulty breathing, oxygen tubing was out on arrival. Patient oxygen tubing  was placed back in his nostrils, stated that he feels better. Prn  Duo neb tx given, will continue to monitor.

## 2016-02-11 NOTE — Progress Notes (Signed)
Wrong entry for CBC add on lab on patient.

## 2016-02-11 NOTE — Progress Notes (Signed)
No bleeding/hematoma or any new bruises noted this shift. Patient denied any acute pain. Resting quietly with his eyes closed, respirations even and unlabored. Will continue to monitor.

## 2016-02-12 LAB — GLUCOSE, CAPILLARY
GLUCOSE-CAPILLARY: 382 mg/dL — AB (ref 65–99)
Glucose-Capillary: 236 mg/dL — ABNORMAL HIGH (ref 65–99)
Glucose-Capillary: 242 mg/dL — ABNORMAL HIGH (ref 65–99)
Glucose-Capillary: 322 mg/dL — ABNORMAL HIGH (ref 65–99)

## 2016-02-12 LAB — BASIC METABOLIC PANEL
ANION GAP: 7 (ref 5–15)
BUN: 44 mg/dL — ABNORMAL HIGH (ref 6–20)
CHLORIDE: 95 mmol/L — AB (ref 101–111)
CO2: 35 mmol/L — ABNORMAL HIGH (ref 22–32)
CREATININE: 1.55 mg/dL — AB (ref 0.61–1.24)
Calcium: 7.9 mg/dL — ABNORMAL LOW (ref 8.9–10.3)
GFR calc non Af Amer: 40 mL/min — ABNORMAL LOW (ref >60)
GFR, EST AFRICAN AMERICAN: 47 mL/min — AB (ref >60)
Glucose, Bld: 244 mg/dL — ABNORMAL HIGH (ref 65–99)
POTASSIUM: 3.5 mmol/L (ref 3.5–5.1)
SODIUM: 137 mmol/L (ref 135–145)

## 2016-02-12 MED ORDER — INSULIN GLARGINE 100 UNIT/ML ~~LOC~~ SOLN
40.0000 [IU] | Freq: Every day | SUBCUTANEOUS | Status: DC
  Administered 2016-02-12 – 2016-02-13 (×2): 40 [IU] via SUBCUTANEOUS
  Filled 2016-02-12 (×2): qty 0.4

## 2016-02-12 MED ORDER — MIDODRINE HCL 5 MG PO TABS
5.0000 mg | ORAL_TABLET | Freq: Three times a day (TID) | ORAL | Status: DC
  Administered 2016-02-12 – 2016-02-14 (×7): 5 mg via ORAL
  Filled 2016-02-12 (×7): qty 1

## 2016-02-12 NOTE — Progress Notes (Signed)
[facility] - [place] [facility]   PATIENT NAME: Jason [name]    MRN#:  [id number]  DATE OF BIRTH:  1932-07-12  SUBJECTIVE:  Hospital Day: 18 days Jason [name] is a 82 y.o. male presenting with .Foot pain, wound  Overnight events: no overnight events Interval Events: no complaints, 4L NCFamily at bedside    REVIEW OF SYSTEMS:  CONSTITUTIONAL: No fever,positive fatigue or weakness.  EYES: No blurred or double vision.  EARS, NOSE, AND THROAT: No tinnitus or ear pain.  RESPIRATORY: No cough, Positive shortness of breath, denies wheezing or hemoptysis.  CARDIOVASCULAR: No chest pain, orthopnea, edema.  GASTROINTESTINAL: No nausea, vomiting, diarrhea or abdominal pain.  GENITOURINARY: No dysuria, hematuria.  ENDOCRINE: No polyuria, nocturia,  HEMATOLOGY: No anemia, easy bruising or bleeding SKIN: No rash or lesion. MUSCULOSKELETAL: No joint pain or arthritis.   NEUROLOGIC: No tingling, numbness, weakness.  PSYCHIATRY: No anxiety or depression.   DRUG ALLERGIES:  No Known Allergies  VITALS:  Blood pressure (!) 101/46, pulse 98, temperature 97.9 F (36.6 C), temperature source Oral, resp. rate 18, height 5\' 8"  (1.727 m), weight 118.2 kg (260 lb 9.6 oz), SpO2 99 %.  PHYSICAL EXAMINATION:  VITAL SIGNS: Vitals:   02/12/16 1129 02/12/16 1131  BP: (!) 103/17 (!) 101/46  Pulse: 97 98  Resp:    Temp: 97.9 F (36.6 C)    GENERAL:83 y.o.male currently in no acute distress. Appears more lethargic today HEAD: Normocephalic, atraumatic.  EYES: Pupils equal, round, reactive to light. Extraocular muscles intact. No scleral icterus.  MOUTH: Moist mucosal membrane. Dentition intact. No abscess noted.  EAR, NOSE, THROAT: Clear without exudates. No external lesions.  NECK: Supple. No thyromegaly. No nodules. No JVD.  PULMONARY: Greatly diminished breath sounds without wheeze rails or rhonci. No use of accessory muscles, Good respiratory effort. good air entry  bilaterally CHEST: Nontender to palpation.  CARDIOVASCULAR: S1 and S2. Regular rate and rhythm. No murmurs, rubs, or gallops. No edema. Pedal pulses 2+ bilaterally.  GASTROINTESTINAL: Soft, nontender, nondistended. No masses. Positive bowel sounds. No hepatosplenomegaly.  MUSCULOSKELETAL: No swelling, clubbing, or edema. Range of motion full in all extremities.  NEUROLOGIC: Cranial nerves II through XII are intact. No gross focal neurological deficits. Sensation intact. Reflexes intact.  SKIN: No ulceration, lesions, rashes, or cyanosis. Skin warm and dry. Turgor intact.  PSYCHIATRIC: Mood, affect within normal limits. The patient is awake, alert and oriented x 3. Insight, judgment intact.      LABORATORY PANEL:   CBC  Recent Labs Lab 02/10/16 1230  WBC 7.2  HGB 9.3*  HCT 28.2*  PLT 121*   ------------------------------------------------------------------------------------------------------------------  Chemistries   Recent Labs Lab 02/12/16 0610  NA 137  K 3.5  CL 95*  CO2 35*  GLUCOSE 244*  BUN 44*  CREATININE 1.55*  CALCIUM 7.9*   ------------------------------------------------------------------------------------------------------------------  Cardiac Enzymes  Recent Labs Lab 02/07/16 0714  TROPONINI 0.88*   ------------------------------------------------------------------------------------------------------------------  RADIOLOGY:  No results found.  EKG:   Orders placed or performed during the hospital encounter of 01/25/16  . EKG 12-Lead  . EKG 12-Lead  . EKG 12-Lead  . EKG 12-Lead  . EKG 12-Lead  . EKG 12-Lead  . EKG 12-Lead  . EKG 12-Lead    ASSESSMENT AND PLAN:   Jason [name] is a 82 y.o. male presenting with Foot wound.  Admitted 01/25/2016 : Day #: 18 days,  underwent surgery 11/11-amputation right great toe with excisional debridement Patient developed chest pain 11/13 Desaturations, respiratory failure  followed 11/15: Recurrent  chest pain started on heparin and nitroglycerin transferred to ICU-NSTEMI 11/16: Plan cardiac catheterization patient is unable to lay flat secondary to hypoxia-cardiac catheterization deferred 11/17: Transferred out of ICU, heparin drip stopped Since that time has been essentially dealing with acute on chronic diastolic congestive heart failure but has made minimal progress     1. Acute on chronic diastolic congestive heart failure: Continue oxygen as required, and Lasix decreased to 20 mg twice a day 2. NSTEMI: Cardiac catheterization has been deferred as above, now on setting of worsening renal function it remains  a poor decision, continue aspirin and statin therapy on Plavix, stopped therapeutic Lovenox  3. Acute kidney injury: Appreciate nephrology input. We will carefully add Midrin 4. Type 2 diabetes: Poorly controlled increase basal insulin and continue sliding scale coverage 5. Right foot osteomyelitis: Has completed course of antibiotics   Overall poor prognosis, not really progressing, not working with PT, palliative care involved   All the records are reviewed and case discussed with Care Management/Social Workerr. Management plans discussed with the patient, family and they are in agreement.  CODE STATUS: dnr TOTAL TIME TAKING CARE OF THIS PATIENT: 33 minutes.   POSSIBLE D/C IN 2-3DAYS, DEPENDING ON CLINICAL CONDITION.   Hower,  Mardi MainlandDavid K M.D on 02/12/2016 at 1:05 PM  Between 7am to 6pm - Pager - [phone]  After 6pm: House Pager: - [phone]  [facility]  Office  [phone]  CC: Primary care physician; Dione Housekeeperlmedo, Mario Ernesto, MD

## 2016-02-12 NOTE — Clinical Social Work Note (Signed)
CSW received consult for nursing home placement. According to chart notes from 02/08/2016, CSW is already following and has already assessed. CSW will con't to follow for dc planning.  Argentina PonderKaren Martha Phu Record, MSW, LCSW-A [phone]

## 2016-02-12 NOTE — Consult Note (Signed)
Date: 02/12/2016                  Patient Name:  Jason [name]  MRN: [id number]  DOB: Jan 23, 1933  Age / Sex: 82 y.o., male         PCP: Valera Castle, MD                 Service Requesting Consult: Internal medicine                 Reason for Consult: CKD, volume overload            History of Present Illness: Patient is a 82 y.o. male with medical problems of DM-2, insulin dependent, peripheral vascular disease, who was admitted to [facility] on 01/25/2016 for evaluation of diabetic foot ulcer. It is being treated with vascular angioplasty (10/24), and wound dbridement.  Patient appears to have Baseline Creatinine of 1.15 (09/2015) It has worsened relatively recently. Noted to have increased from 1.22 on 11/11 to 1.71 on 11/12 which is right around the time of his non-STEMI during this hospitalization. At present, is serum creatinine seems to be stabilizing between 1.5-1.6 with corresponding GFR of 40. Nephrology consult has been requested to evaluate renal failure and volume status   Medications: Outpatient medications: Prescriptions Prior to Admission  Medication Sig Dispense Refill Last Dose  . albuterol (PROVENTIL HFA;VENTOLIN HFA) 108 (90 Base) MCG/ACT inhaler Inhale 2 puffs into the lungs.    prn at prn  . Amino Acids-Protein Hydrolys (FEEDING SUPPLEMENT, PRO-STAT SUGAR FREE 64,) LIQD Take 30 mLs by mouth 3 (three) times daily with meals.   01/24/2016 at 2000  . aspirin 81 MG tablet Take 81 mg by mouth daily.   01/24/2016 at 0800  . budesonide-formoterol (SYMBICORT) 160-4.5 MCG/ACT inhaler Inhale 2 puffs into the lungs 2 (two) times daily.   01/24/2016 at 2000  . clopidogrel (PLAVIX) 75 MG tablet Take 1 tablet (75 mg total) by mouth daily. 30 tablet 5 01/24/2016 at 0900  . docusate sodium (COLACE) 100 MG capsule Take 200 mg by mouth 2 (two) times daily.   01/24/2016 at Unknown time  . FLUoxetine (PROZAC) 40 MG capsule Take 40 mg by mouth daily.   01/24/2016 at 0800  . insulin  lispro (HUMALOG) 100 UNIT/ML injection Inject 20 Units into the skin. 0-150=0 151-200=2unit 201-250=4units 251-300=6 units 301-350=8 units > 351= 10 units & recheck in 2 hours   01/24/2016 at Unknown time  . LANTUS SOLOSTAR 100 UNIT/ML Solostar Pen Inject 90 Units into the skin daily. subQ at 1630 with dinner   01/24/2016 at 1630  . senna (SENOKOT) 8.6 MG TABS tablet Take 2 tablets by mouth 2 (two) times daily.   01/24/2016 at 2000  . simvastatin (ZOCOR) 20 MG tablet Take 20 mg by mouth at bedtime.   01/24/2016 at 2000  . SYNTHROID 125 MCG tablet Take 125 mcg by mouth daily.   01/25/2016 at 0600  . tamsulosin (FLOMAX) 0.4 MG CAPS capsule Take 0.4 mg by mouth daily.   01/24/2016 at 0800  . traMADol (ULTRAM) 50 MG tablet Take 50 mg by mouth 4 (four) times daily as needed.   01/24/2016 at 2000  . blood glucose meter kit and supplies KIT Dispense based on patient and insurance preference. Use up to four times daily as directed. (FOR ICD-9 250.00, 250.01). 1 each 0 Past Month at Unknown time  . oxyCODONE (OXY IR/ROXICODONE) 5 MG immediate release tablet Take 1 tablet (5 mg total)  by mouth every 4 (four) hours as needed for moderate pain. 30 tablet 0 Past Week at Unknown time    Current medications: Current Facility-Administered Medications  Medication Dose Route Frequency Provider Last Rate Last Dose  . acetaminophen (TYLENOL) tablet 650 mg  650 mg Oral Q6H PRN Lytle Butte, MD   650 mg at 01/28/16 2116   Or  . acetaminophen (TYLENOL) suppository 650 mg  650 mg Rectal Q6H PRN Lytle Butte, MD      . alum & mag hydroxide-simeth (MAALOX/MYLANTA) 200-200-20 MG/5ML suspension 15 mL  15 mL Oral Q8H PRN Hillary Bow, MD   15 mL at 01/30/16 1647  . aspirin EC tablet 81 mg  81 mg Oral Daily Lytle Butte, MD   81 mg at 02/12/16 1002  . clopidogrel (PLAVIX) tablet 75 mg  75 mg Oral Daily Lytle Butte, MD   75 mg at 02/12/16 1002  . collagenase (SANTYL) ointment   Topical 2 days Lytle Butte, MD      .  famotidine (PEPCID) tablet 20 mg  20 mg Oral Daily Epifanio Lesches, MD   20 mg at 02/12/16 1002  . feeding supplement (GLUCERNA SHAKE) (GLUCERNA SHAKE) liquid 237 mL  237 mL Oral TID BM Lytle Butte, MD   237 mL at 02/12/16 1003  . FLUoxetine (PROZAC) capsule 40 mg  40 mg Oral Daily Lytle Butte, MD   40 mg at 02/12/16 1002  . furosemide (LASIX) injection 20 mg  20 mg Intravenous BID Lytle Butte, MD   20 mg at 02/12/16 1002  . heparin injection 5,000 Units  5,000 Units Subcutaneous Q8H Lytle Butte, MD   5,000 Units at 02/12/16 0612  . insulin aspart (novoLOG) injection 0-15 Units  0-15 Units Subcutaneous TID WC Lytle Butte, MD   5 Units at 02/12/16 [phone]  . insulin aspart (novoLOG) injection 0-5 Units  0-5 Units Subcutaneous QHS Lytle Butte, MD   3 Units at 02/11/16 2200  . insulin aspart (novoLOG) injection 5 Units  5 Units Subcutaneous TID WC Nicholes Mango, MD   5 Units at 02/12/16 0827  . insulin glargine (LANTUS) injection 35 Units  35 Units Subcutaneous Q supper Lytle Butte, MD   35 Units at 02/11/16 1730  . ipratropium-albuterol (DUONEB) 0.5-2.5 (3) MG/3ML nebulizer solution 3 mL  3 mL Nebulization Q6H PRN Epifanio Lesches, MD   3 mL at 02/11/16 1901  . levothyroxine (SYNTHROID, LEVOTHROID) tablet 125 mcg  125 mcg Oral Q0600 Lytle Butte, MD   125 mcg at 02/12/16 0612  . megestrol (MEGACE) 400 MG/10ML suspension 200 mg  200 mg Oral BID Nicholes Mango, MD   200 mg at 02/12/16 1003  . midodrine (PROAMATINE) tablet 5 mg  5 mg Oral TID WC Takeesha Isley, MD      . mometasone-formoterol (DULERA) 200-5 MCG/ACT inhaler 2 puff  2 puff Inhalation BID Lytle Butte, MD   2 puff at 02/12/16 1002  . nitroGLYCERIN (NITROSTAT) SL tablet 0.4 mg  0.4 mg Sublingual Q5 min PRN Epifanio Lesches, MD   0.4 mg at 02/06/16 1903  . ondansetron (ZOFRAN) injection 4 mg  4 mg Intravenous Q6H PRN Fritzi Mandes, MD   4 mg at 02/10/16 0857  . oxyCODONE (Oxy IR/ROXICODONE) immediate release tablet 5 mg  5 mg  Oral Q4H PRN Lytle Butte, MD   5 mg at 02/10/16 0931  . oxyCODONE-acetaminophen (PERCOCET/ROXICET) 5-325 MG per tablet 1-2 tablet  1-2 tablet Oral Q4H PRN Sharlotte Alamo, DPM   2 tablet at 01/29/16 1503  . pantoprazole (PROTONIX) EC tablet 40 mg  40 mg Oral Daily Lytle Butte, MD   40 mg at 02/12/16 1002  . senna-docusate (Senokot-S) tablet 2 tablet  2 tablet Oral BID Fritzi Mandes, MD   2 tablet at 02/12/16 1002  . simvastatin (ZOCOR) tablet 20 mg  20 mg Oral QHS Lytle Butte, MD   20 mg at 02/11/16 2159  . sodium chloride flush (NS) 0.9 % injection 10-40 mL  10-40 mL Intracatheter Q12H Epifanio Lesches, MD   10 mL at 02/12/16 1003  . sodium chloride flush (NS) 0.9 % injection 10-40 mL  10-40 mL Intracatheter PRN Epifanio Lesches, MD   10 mL at 02/09/16 0853  . tamsulosin (FLOMAX) capsule 0.4 mg  0.4 mg Oral Daily Lytle Butte, MD   0.4 mg at 02/12/16 1001      Allergies: No Known Allergies    Past Medical History: Past Medical History:  Diagnosis Date  . BPH (benign prostatic hyperplasia)   . Constipation   . COPD (chronic obstructive pulmonary disease) ([place])   . Diabetes mellitus without complication ([place])   . GERD (gastroesophageal reflux disease)   . HTN (hypertension)   . Hypothyroidism   . Stroke [facility])      Past Surgical History: Past Surgical History:  Procedure Laterality Date  . AMPUTATION TOE Right 01/28/2016   Procedure: AMPUTATION TOE;  Surgeon: Sharlotte Alamo, DPM;  Location: [facility] ORS;  Service: Podiatry;  Laterality: Right;  . IRRIGATION AND DEBRIDEMENT FOOT Right 01/28/2016   Procedure: IRRIGATION AND DEBRIDEMENT FOOT;  Surgeon: Sharlotte Alamo, DPM;  Location: [facility] ORS;  Service: Podiatry;  Laterality: Right;  . PERIPHERAL VASCULAR CATHETERIZATION Right 01/10/2016   Procedure: Lower Extremity Angiography;  Surgeon: Katha Cabal, MD;  Location: [place] CV LAB;  Service: Cardiovascular;  Laterality: Right;  . PERIPHERAL VASCULAR CATHETERIZATION N/A  01/10/2016   Procedure: Abdominal Aortogram w/Lower Extremity;  Surgeon: Katha Cabal, MD;  Location: [place] CV LAB;  Service: Cardiovascular;  Laterality: N/A;  . PERIPHERAL VASCULAR CATHETERIZATION  01/10/2016   Procedure: Lower Extremity Intervention;  Surgeon: Katha Cabal, MD;  Location: [place] [facility];  Service: Cardiovascular;;  . ROTATOR CUFF REPAIR Left   . THYROID SURGERY       Family History: Family History  Problem Relation Age of Onset  . Diabetes Mother   . Thyroid disease Mother   . Pancreatic cancer Father      Social History: Social History   Social History  . Marital status: Married    Spouse name: N/A  . Number of children: N/A  . Years of education: N/A   Occupational History  . Not on file.   Social History Main Topics  . Smoking status: Former Research scientist (life sciences)  . Smokeless tobacco: Never Used  . Alcohol use No  . Drug use: No  . Sexual activity: Not on file   Other Topics Concern  . Not on file   Social History Narrative  . No narrative on file     Review of Systems: Gen: no fevers or chills HEENT: no vision or hearing problems CV: hospital stay complicated by non-STEMI.  Aortic stenosis by echo.  Generalized edema Resp: some difficulty breathing.  No cough or sputum at present and requiring nasal cannula oxygen ZW:CHENIDPO is poor.  Family is bringing food from outside to help him eat some. GU :  has history of BPH.  No problems with voiding reported at this time. MS: was ambulatory with a walker until a few weeks ago.  Now, is not able to walk due to foot wound Derm:  no complaints Psych:no complaints Heme: no complaints Neuro: diabetic neuropathy Endocrine.  Long-standing diabetes  Vital Signs: Blood pressure (!) 101/46, pulse 98, temperature 97.9 F (36.6 C), temperature source Oral, resp. rate 18, height 5' 8" (1.727 m), weight 118.2 kg (260 lb 9.6 oz), SpO2 99 %.  No intake or output data in the 24 hours ending  02/12/16 1150  Weight trends: Filed Weights   02/10/16 0500 02/11/16 0416 02/12/16 0525  Weight: 118.3 kg (260 lb 14.4 oz) 118.3 kg (260 lb 14.4 oz) 118.2 kg (260 lb 9.6 oz)    Physical Exam: General:  elderly, frail gentleman, laying in the bed  HEENT Anicteric, moist oral mucous membranes  Neck: supple, no masses  Lungs: Mild diffuse basilar crackles, and nasal cannula oxygen supplementation  Heart:: 2 /6 crescendo systolic murmur,regular  Abdomen: Soft, nontender, nondistended  Extremities:  1+ dependent edema over extremities, thighs, lower abdomen  Neurologic: Alert, oriented  Skin: Warm, dry             Lab results: Basic Metabolic Panel:  Recent Labs Lab 02/10/16 0532 02/11/16 0830 02/12/16 0610  NA 138 137 137  K 3.5 3.4* 3.5  CL 95* 95* 95*  CO2 36* 36* 35*  GLUCOSE 144* 282* 244*  BUN 39* 43* 44*  CREATININE 1.52* 1.62* 1.55*  CALCIUM 8.1* 8.0* 7.9*    Liver Function Tests: No results for input(s): AST, ALT, ALKPHOS, BILITOT, PROT, ALBUMIN in the last 168 hours. No results for input(s): LIPASE, AMYLASE in the last 168 hours. No results for input(s): AMMONIA in the last 168 hours.  CBC:  Recent Labs Lab 02/09/16 0830 02/10/16 1230  WBC 9.6 7.2  HGB 10.3* 9.3*  HCT 30.7* 28.2*  MCV 85.4 85.7  PLT 155 121*    Cardiac Enzymes:  Recent Labs Lab 02/07/16 0714  TROPONINI 0.88*    BNP: Invalid input(s): POCBNP  CBG:  Recent Labs Lab 02/11/16 0746 02/11/16 1239 02/11/16 1700 02/11/16 2113 02/12/16 0732  GLUCAP 286* 301* 279* 299* 236*    Microbiology: Recent Results (from the past 720 hour(s))  MRSA PCR Screening     Status: None   Collection Time: 01/25/16  8:01 PM  Result Value Ref Range Status   MRSA by PCR NEGATIVE NEGATIVE Final    Comment:        The GeneXpert MRSA Assay (FDA approved for NASAL specimens only), is one component of a comprehensive MRSA colonization surveillance program. It is not intended to diagnose  MRSA infection nor to guide or monitor treatment for MRSA infections.   Aerobic/Anaerobic Culture (surgical/deep wound)     Status: None   Collection Time: 01/28/16  8:25 AM  Result Value Ref Range Status   Specimen Description WOUND RIGHT TOE  Final   Special Requests RIGHT GREAT TOE   Final   Gram Stain   Final    ABUNDANT WBC PRESENT, PREDOMINANTLY PMN NO ORGANISMS SEEN    Culture   Final    FEW PROTEUS VULGARIS NO ANAEROBES ISOLATED Performed at [facility]    Report Status 02/02/2016 FINAL  Final   Organism ID, Bacteria PROTEUS VULGARIS  Final      Susceptibility   Proteus vulgaris - MIC*    AMPICILLIN >=32 RESISTANT Resistant  CEFAZOLIN >=64 RESISTANT Resistant     CEFEPIME <=1 SENSITIVE Sensitive     CEFTAZIDIME <=1 SENSITIVE Sensitive     CEFTRIAXONE <=1 SENSITIVE Sensitive     CIPROFLOXACIN <=0.25 SENSITIVE Sensitive     GENTAMICIN <=1 SENSITIVE Sensitive     IMIPENEM 2 SENSITIVE Sensitive     TRIMETH/SULFA <=20 SENSITIVE Sensitive     AMPICILLIN/SULBACTAM 16 INTERMEDIATE Intermediate     PIP/TAZO <=4 SENSITIVE Sensitive     * FEW PROTEUS VULGARIS     Coagulation Studies: No results for input(s): LABPROT, INR in the last 72 hours.  Urinalysis: No results for input(s): COLORURINE, LABSPEC, PHURINE, GLUCOSEU, HGBUR, BILIRUBINUR, KETONESUR, PROTEINUR, UROBILINOGEN, NITRITE, LEUKOCYTESUR in the last 72 hours.  Invalid input(s): APPERANCEUR      Imaging:  No results found.   Assessment & Plan: Pt is a 82 y.o. Caucasian male with coronary disease, aortic stenosis, diabetes with neuropathy , was admitted on 01/25/2016 for management of right foot diabetic ulcer. Hospital stay was complicated by non-STEMI, acute renal failure  1.  Acute renal failure on chronic kidney disease stage III Baseline creatinine 1.15 from July 2017.  Creatinine increased from 1.22 to 1.71 on November 11-12. Creatinine now stabilizing at 1.5-1.6 Multiple contributing  factors including IV contrast exposure October 24, non-STEMI during this hospitalization, inflammation from foot ulcer.  At present, serum creatinine is stabilizing.  Avoid hypotension.  Continue to maintain optimal volume status. Blood pressure is low.  Patient cannot tolerate ACE inhibitor or ARB at present Obtain urinalysis, urine protein/creatinine ratio  2.  Generalized edema Multifactorial but likely exacerbated by low albumin will confirm latest albumin level. Also obtain abdominal ultrasound for renal and liver imaging. Patient is currently getting low-dose Lasix. Dose increase is limited due to low blood pressure.  We have added midodrine 5 mg 3 times a day today cautiously, keeping in mind recent non-STEMI. We're hoping to bring his blood pressure some follow for adequate renal perfusion and hopefully increased diuresis.

## 2016-02-13 ENCOUNTER — Inpatient Hospital Stay: Payer: Medicare Other

## 2016-02-13 LAB — GLUCOSE, CAPILLARY
GLUCOSE-CAPILLARY: 260 mg/dL — AB (ref 65–99)
GLUCOSE-CAPILLARY: 286 mg/dL — AB (ref 65–99)
GLUCOSE-CAPILLARY: 251 mg/dL — AB (ref 65–99)
Glucose-Capillary: 255 mg/dL — ABNORMAL HIGH (ref 65–99)

## 2016-02-13 LAB — URINALYSIS COMPLETE WITH MICROSCOPIC (ARMC ONLY)
BILIRUBIN URINE: NEGATIVE
Bacteria, UA: NONE SEEN
Glucose, UA: 150 mg/dL — AB
Hgb urine dipstick: NEGATIVE
KETONES UR: NEGATIVE mg/dL
Leukocytes, UA: NEGATIVE
NITRITE: NEGATIVE
PROTEIN: NEGATIVE mg/dL
SPECIFIC GRAVITY, URINE: 1.014 (ref 1.005–1.030)
Squamous Epithelial / LPF: NONE SEEN
pH: 5 (ref 5.0–8.0)

## 2016-02-13 LAB — CBC
HCT: 28.6 % — ABNORMAL LOW (ref 40.0–52.0)
HEMOGLOBIN: 9.6 g/dL — AB (ref 13.0–18.0)
MCH: 28.3 pg (ref 26.0–34.0)
MCHC: 33.5 g/dL (ref 32.0–36.0)
MCV: 84.5 fL (ref 80.0–100.0)
PLATELETS: 110 10*3/uL — AB (ref 150–440)
RBC: 3.38 MIL/uL — AB (ref 4.40–5.90)
RDW: 14.8 % — ABNORMAL HIGH (ref 11.5–14.5)
WBC: 9.1 10*3/uL (ref 3.8–10.6)

## 2016-02-13 LAB — RENAL FUNCTION PANEL
ALBUMIN: 2.4 g/dL — AB (ref 3.5–5.0)
ANION GAP: 5 (ref 5–15)
BUN: 39 mg/dL — ABNORMAL HIGH (ref 6–20)
CALCIUM: 7.8 mg/dL — AB (ref 8.9–10.3)
CO2: 36 mmol/L — AB (ref 22–32)
CREATININE: 1.46 mg/dL — AB (ref 0.61–1.24)
Chloride: 95 mmol/L — ABNORMAL LOW (ref 101–111)
GFR, EST AFRICAN AMERICAN: 50 mL/min — AB (ref >60)
GFR, EST NON AFRICAN AMERICAN: 43 mL/min — AB (ref >60)
Glucose, Bld: 303 mg/dL — ABNORMAL HIGH (ref 65–99)
PHOSPHORUS: 2.8 mg/dL (ref 2.5–4.6)
Potassium: 3.7 mmol/L (ref 3.5–5.1)
SODIUM: 136 mmol/L (ref 135–145)

## 2016-02-13 LAB — PROTEIN / CREATININE RATIO, URINE
Creatinine, Urine: 116 mg/dL
PROTEIN CREATININE RATIO: 0.09 mg/mg{creat} (ref 0.00–0.15)
Total Protein, Urine: 10 mg/dL

## 2016-02-13 NOTE — Progress Notes (Signed)
[facility]  ROUNDING NOTE   Subjective:  Patient resting comfortably in bed. Creatinine down to 1.46 today. BUN also down to 39. Case discussed with Dr. Allena KatzPatel.   Objective:  Vital signs in last 24 hours:  Temp:  [97.7 F (36.5 C)-97.8 F (36.6 C)] 97.8 F (36.6 C) (11/27 1127) Pulse Rate:  [88-107] 96 (11/27 1127) Resp:  [12-18] 12 (11/27 1127) BP: (93-111)/(31-52) 111/52 (11/27 1127) SpO2:  [90 %-100 %] 91 % (11/27 1402) Weight:  [117.3 kg (258 lb 11.2 oz)] 117.3 kg (258 lb 11.2 oz) (11/27 0414)  Weight change: -0.862 kg (-1 lb 14.4 oz) Filed Weights   02/11/16 0416 02/12/16 0525 02/13/16 0414  Weight: 118.3 kg (260 lb 14.4 oz) 118.2 kg (260 lb 9.6 oz) 117.3 kg (258 lb 11.2 oz)    Intake/Output: I/O last 3 completed shifts: In: -  Out: 250 [Urine:250]   Intake/Output this shift:  No intake/output data recorded.  Physical Exam: General: No acute distress, laying in bed  Head: Normocephalic, atraumatic. Moist oral mucosal membranes  Eyes: Anicteric  Neck: Supple, trachea midline  Lungs:  Scattered rhonchi, normal effort  Heart: S1S2 no rubs  Abdomen:  Soft, nontender, bowel sounds present  Extremities: 1+ peripheral edema.  Neurologic: Nonfocal, moving all four extremities  Skin: Hyperpigmentation of skin overlying both shins       Basic Metabolic Panel:  Recent Labs Lab 02/09/16 0830 02/10/16 0532 02/11/16 0830 02/12/16 0610 02/13/16 0434  NA 138 138 137 137 136  K 3.6 3.5 3.4* 3.5 3.7  CL 97* 95* 95* 95* 95*  CO2 34* 36* 36* 35* 36*  GLUCOSE 75 144* 282* 244* 303*  BUN 44* 39* 43* 44* 39*  CREATININE 1.63* 1.52* 1.62* 1.55* 1.46*  CALCIUM 8.4* 8.1* 8.0* 7.9* 7.8*  PHOS  --   --   --   --  2.8    Liver Function Tests:  Recent Labs Lab 02/13/16 0434  ALBUMIN 2.4*   No results for input(s): LIPASE, AMYLASE in the last 168 hours. No results for input(s): AMMONIA in the last 168 hours.  CBC:  Recent Labs Lab 02/06/16 2017  02/08/16 0511 02/09/16 0830 02/10/16 1230 02/13/16 0614  WBC 9.4 13.1* 9.6 7.2 9.1  HGB 10.0* 10.2* 10.3* 9.3* 9.6*  HCT 29.5* 30.9* 30.7* 28.2* 28.6*  MCV 87.1 87.0 85.4 85.7 84.5  PLT 163 171 155 121* 110*    Cardiac Enzymes:  Recent Labs Lab 02/06/16 2017 02/07/16 0233 02/07/16 0714  TROPONINI 0.78* 0.83* 0.88*    BNP: Invalid input(s): POCBNP  CBG:  Recent Labs Lab 02/12/16 1125 02/12/16 1641 02/12/16 2114 02/13/16 0843 02/13/16 1127  GLUCAP 242* 322* 382* 260* 286*    Microbiology: Results for orders placed or performed during the hospital encounter of 01/25/16  MRSA PCR Screening     Status: None   Collection Time: 01/25/16  8:01 PM  Result Value Ref Range Status   MRSA by PCR NEGATIVE NEGATIVE Final    Comment:        The GeneXpert MRSA Assay (FDA approved for NASAL specimens only), is one component of a comprehensive MRSA colonization surveillance program. It is not intended to diagnose MRSA infection nor to guide or monitor treatment for MRSA infections.   Aerobic/Anaerobic Culture (surgical/deep wound)     Status: None   Collection Time: 01/28/16  8:25 AM  Result Value Ref Range Status   Specimen Description WOUND RIGHT TOE  Final   Special Requests RIGHT GREAT  TOE   Final   Gram Stain   Final    ABUNDANT WBC PRESENT, PREDOMINANTLY PMN NO ORGANISMS SEEN    Culture   Final    FEW PROTEUS VULGARIS NO ANAEROBES ISOLATED [facility]    Report Status 02/02/2016 FINAL  Final   Organism ID, Bacteria PROTEUS VULGARIS  Final      Susceptibility   Proteus vulgaris - MIC*    AMPICILLIN >=32 RESISTANT Resistant     CEFAZOLIN >=64 RESISTANT Resistant     CEFEPIME <=1 SENSITIVE Sensitive     CEFTAZIDIME <=1 SENSITIVE Sensitive     CEFTRIAXONE <=1 SENSITIVE Sensitive     CIPROFLOXACIN <=0.25 SENSITIVE Sensitive     GENTAMICIN <=1 SENSITIVE Sensitive     IMIPENEM 2 SENSITIVE Sensitive     TRIMETH/SULFA <=20 SENSITIVE  Sensitive     AMPICILLIN/SULBACTAM 16 INTERMEDIATE Intermediate     PIP/TAZO <=4 SENSITIVE Sensitive     * FEW PROTEUS VULGARIS    Coagulation Studies: No results for input(s): LABPROT, INR in the last 72 hours.  Urinalysis:  Recent Labs  02/13/16 0434  COLORURINE YELLOW*  LABSPEC 1.014  PHURINE 5.0  GLUCOSEU 150*  HGBUR NEGATIVE  BILIRUBINUR NEGATIVE  KETONESUR NEGATIVE  PROTEINUR NEGATIVE  NITRITE NEGATIVE  LEUKOCYTESUR NEGATIVE      Imaging: [place] Abdomen Complete  Result Date: 02/13/2016 CLINICAL DATA:  Acute renal injury, possible cirrhosis. EXAM: ABDOMEN ULTRASOUND COMPLETE COMPARISON:  None in PACs FINDINGS: Gallbladder: The gallbladder is adequately distended. The gallbladder wall is irregularly thickened measuring up to 3.5 mm. There is echogenic sludge versus tiny stones. There is no pericholecystic fluid or positive sonographic Murphy's sign. Common bile duct: Diameter: 4.4 mm Liver: The hepatic echotexture is heterogeneous and increased. The surface contour of the liver remains smooth. There is no focal mass nor ductal dilation. IVC: No abnormality visualized. Pancreas: The pancreatic duct is mildly prominent at 3.8 mm. The observed portions of the pancreatic parenchyma appear normal however. Spleen: The spleen is mildly increased measuring 12 x 13 x 5 cm corresponding to a volume of 440 cc Right Kidney: Length: 11 cm. Echogenicity within normal limits. No mass or hydronephrosis visualized. Left Kidney: Length: 13.2 cm. Echogenicity within normal limits. No mass or hydronephrosis visualized. Abdominal aorta: Bowel gas obscured the distal abdominal aorta. The proximal and mid aorta exhibit normal caliber. Other findings: No ascites is observed. IMPRESSION: 1. Gallbladder sludge versus tiny stones with minimal gallbladder wall thickening. The findings may reflect chronic cholecystitis. A nuclear medicine hepatobiliary scan with gallbladder ejection fraction may be useful. 2.  Heterogeneous hepatic echotexture without evidence of parenchymal masses or ductal dilation. Very mild splenomegaly. No ascites. 3. No hydronephrosis nor evidence of significant renal atrophy nor cortical echotexture changes. Electronically Signed   By: David  SwazilandJordan M.D.   On: 02/13/2016 08:35     Medications:    . aspirin EC  81 mg Oral Daily  . clopidogrel  75 mg Oral Daily  . collagenase   Topical 2 days  . famotidine  20 mg Oral Daily  . feeding supplement (GLUCERNA SHAKE)  237 mL Oral TID BM  . FLUoxetine  40 mg Oral Daily  . furosemide  20 mg Intravenous BID  . heparin subcutaneous  5,000 Units Subcutaneous Q8H  . insulin aspart  0-15 Units Subcutaneous TID WC  . insulin aspart  0-5 Units Subcutaneous QHS  . insulin aspart  5 Units Subcutaneous TID WC  . insulin glargine  40  Units Subcutaneous Q supper  . levothyroxine  125 mcg Oral Q0600  . megestrol  200 mg Oral BID  . midodrine  5 mg Oral TID WC  . mometasone-formoterol  2 puff Inhalation BID  . pantoprazole  40 mg Oral Daily  . senna-docusate  2 tablet Oral BID  . simvastatin  20 mg Oral QHS  . sodium chloride flush  10-40 mL Intracatheter Q12H  . tamsulosin  0.4 mg Oral Daily   acetaminophen **OR** acetaminophen, alum & mag hydroxide-simeth, ipratropium-albuterol, nitroGLYCERIN, ondansetron (ZOFRAN) IV, oxyCODONE, oxyCODONE-acetaminophen, sodium chloride flush  Assessment/ Plan:  82 y.o.  Caucasian male with coronary disease, aortic stenosis, diabetes with neuropathy , was admitted on 01/25/2016 for management of right foot diabetic ulcer. Hospital stay was complicated by non-STEMI, acute renal failure  1.  Acute renal failure on chronic kidney disease stage III Baseline creatinine 1.15 from July 2017.  Creatinine increased from 1.22 to 1.71 on November 11-12. Creatinine now stabilizing at 1.5-1.6 Multiple contributing factors including IV contrast exposure October 24, non-STEMI during this hospitalization,  inflammation from foot ulcer.  -  Renal function appears to be improving.  Creatinine down to 1.4.  Baseline creatinine 1.15. Renal imaging was negative for hydronephrosis. He will likely need ongoing monitoring as an outpatient.  2.  Generalized edema:  Blood pressure currently 111/52.  Patient was started on midodrine 5 mg by mouth 3 times a day.   LOS: 19 Damarco Keysor 11/27/20173:04 PM

## 2016-02-13 NOTE — Progress Notes (Signed)
Physical Therapy Treatment Patient Details Name: Jason [name] MRN: [id number] DOB: 08-23-1932 Today's Date: 02/13/2016    History of Present Illness Pt is an 82 year old male with osteomyelitis of the foot here for amputation of toe. Patient is s/p right great toe amputation with debridement on 01/28/16; He was at short term rehab facility prior to admittance where he was getting care for last few weeks. Pt was transferred to the CCU but is now back on the floor. Hospital course complicated d/t NSTEMI and acute on chronic diastolic CHF.    PT Comments    Pt agreeable to bed ex's and sitting on edge of bed but declined attempting to stand d/t weakness.  Time sitting on edge of bed limited d/t fatigue and pt c/o soreness in bottom (pt repositioned in bed to increase comfort laying down in bed and decrease pressure on pt's bottom).  Will continue to progress pt with strengthening, increasing activity tolerance, and increasing functional mobility with decreased assist levels per pt tolerance.   Follow Up Recommendations  SNF     Equipment Recommendations   (TBD in post acute setting)    Recommendations for Other Services       Precautions / Restrictions Precautions Precautions: Fall Required Braces or Orthoses: Other Brace/Splint Other Brace/Splint: Post-op shoe on RLE Restrictions Weight Bearing Restrictions: Yes RLE Weight Bearing: Partial weight bearing Other Position/Activity Restrictions: Per orders:  Patient may weight-bear on the right foot using a surgical shoe with pressure flap on the foot and only limited amounts, mostly for transfer or bathroom privileges. Use 4. walker for assistance    Mobility  Bed Mobility Overal bed mobility: Needs Assistance Bed Mobility: Supine to Sit;Sit to Supine;Rolling Rolling: Max assist (logroll to R side to place pillows for pressure relief)   Supine to sit: Max assist;HOB elevated Sit to supine: Max assist;HOB elevated   General bed  mobility comments: assist for trunk and B LE's; vc's for technique to assist; 2 assist to boost pt up in bed  Transfers                 General transfer comment: Patient declined attempt at standing.   Ambulation/Gait                 Stairs            Wheelchair Mobility    Modified Rankin (Stroke Patients Only)       Balance Overall balance assessment: Needs assistance Sitting-balance support: Bilateral upper extremity supported;Feet supported Sitting balance-Leahy Scale: Fair Sitting balance - Comments: Fatigued with sitting about 10 minutes                            Cognition Arousal/Alertness: Awake/alert Behavior During Therapy: WFL for tasks assessed/performed Overall Cognitive Status: Within Functional Limits for tasks assessed                      Exercises Total Joint Exercises Ankle Circles/Pumps: AROM;Strengthening;Both;10 reps;Supine Quad Sets: AROM;Strengthening;Both;10 reps;Supine Short Arc Quad: AROM;Strengthening;Both;10 reps;Supine Heel Slides: AAROM;Strengthening;Both;10 reps;Supine Hip ABduction/ADduction: AAROM;Strengthening;Both;10 reps;Supine  Vc's required for technique.    General Comments   Nursing cleared pt for participation in physical therapy.  Pt agreeable to PT session.      Pertinent Vitals/Pain Pain Assessment: 0-10 Pain Score: 5  Pain Location: Pt's "bottom" d/t position in bed Pain Descriptors / Indicators: Sore Pain Intervention(s): Limited activity within patient's tolerance;Monitored during  session;Repositioned  Vitals (HR and O2 on 4 L/min O2 via nasal cannula) stable and WFL throughout treatment session.     Home Living                      Prior Function            PT Goals (current goals can now be found in the care plan section) Acute Rehab PT Goals Patient Stated Goal: to get stronger PT Goal Formulation: With patient Time For Goal Achievement: 02/27/16 Potential  to Achieve Goals: Fair Progress towards PT goals: Progressing toward goals    Frequency    Min 2X/week      PT Plan Current plan remains appropriate    Co-evaluation             End of Session Equipment Utilized During Treatment: Oxygen Activity Tolerance: Patient limited by fatigue;Patient limited by pain (Pt reporting soreness in bottom sitting edge of bed and needing to lay back down) Patient left: in bed;with call bell/phone within reach;with bed alarm set (R LE in prevalon boot)     Time: [id number] PT Time Calculation (min) (ACUTE ONLY): 25 min  Charges:  $Therapeutic Exercise: 8-22 mins $Therapeutic Activity: 8-22 mins                    G CodesHendricks [name]:      Jason [name] 02/13/2016, 9:46 AM Jason [name], PT [phone]

## 2016-02-13 NOTE — Progress Notes (Signed)
In and out cath done per Dr. Mosetta PuttPyreddy's order. Urine  sent to the lab and awaiting results. Patient has been NPO since midnight for the abdominal US. No acute distress noted. Patient was medicated with oxycodone 5 mg at 1942 for generalized pain. Pain med was effective. Patient has been resting well with his eyes closed, respirations even and unlabored. Will continue to monitor.

## 2016-02-13 NOTE — Progress Notes (Signed)
11 Days Post-Op  Subjective: Patient seen. Still denies any pain with his right foot.  Objective: Vital signs in last 24 hours: Temp:  [97.7 F (36.5 C)-97.8 F (36.6 C)] 97.8 F (36.6 C) (11/27 1127) Pulse Rate:  [92-107] 96 (11/27 1127) Resp:  [12-18] 12 (11/27 1127) BP: (100-111)/(31-52) 111/52 (11/27 1127) SpO2:  [90 %-100 %] 91 % (11/27 1402) Weight:  [117.3 kg (258 lb 11.2 oz)] 117.3 kg (258 lb 11.2 oz) (11/27 0414) Last BM Date: 02/10/16  Intake/Output from previous day: 11/26 0701 - 11/27 0700 In: -  Out: 250 [Urine:250] Intake/Output this shift: No intake/output data recorded.  Still some mild to moderate bleeding from the right first ray resection site. No signs of any purulence or cellulitis. Debridement site at the fifth metatarsal is dry with some mild proximal necrosis but no significant change since last week. Remaining toes all still appear viable. Still intact eschar on the right heel with some surrounding ulceration and minimal purulence drainage on the bandaging. Again no significant cellulitis.  Lab Results:   Recent Labs  02/13/16 0614  WBC 9.1  HGB 9.6*  HCT 28.6*  PLT 110*   BMET  Recent Labs  02/12/16 0610 02/13/16 0434  NA 137 136  K 3.5 3.7  CL 95* 95*  CO2 35* 36*  GLUCOSE 244* 303*  BUN 44* 39*  CREATININE 1.55* 1.46*  CALCIUM 7.9* 7.8*   PT/INR No results for input(s): LABPROT, INR in the last 72 hours. ABG No results for input(s): PHART, HCO3 in the last 72 hours.  Invalid input(s): PCO2, PO2  Studies/Results: Koreas Abdomen Complete  Result Date: 02/13/2016 CLINICAL DATA:  Acute renal injury, possible cirrhosis. EXAM: ABDOMEN ULTRASOUND COMPLETE COMPARISON:  None in PACs FINDINGS: Gallbladder: The gallbladder is adequately distended. The gallbladder wall is irregularly thickened measuring up to 3.5 mm. There is echogenic sludge versus tiny stones. There is no pericholecystic fluid or positive sonographic Murphy's sign. Common  bile duct: Diameter: 4.4 mm Liver: The hepatic echotexture is heterogeneous and increased. The surface contour of the liver remains smooth. There is no focal mass nor ductal dilation. IVC: No abnormality visualized. Pancreas: The pancreatic duct is mildly prominent at 3.8 mm. The observed portions of the pancreatic parenchyma appear normal however. Spleen: The spleen is mildly increased measuring 12 x 13 x 5 cm corresponding to a volume of 440 cc Right Kidney: Length: 11 cm. Echogenicity within normal limits. No mass or hydronephrosis visualized. Left Kidney: Length: 13.2 cm. Echogenicity within normal limits. No mass or hydronephrosis visualized. Abdominal aorta: Bowel gas obscured the distal abdominal aorta. The proximal and mid aorta exhibit normal caliber. Other findings: No ascites is observed. IMPRESSION: 1. Gallbladder sludge versus tiny stones with minimal gallbladder wall thickening. The findings may reflect chronic cholecystitis. A nuclear medicine hepatobiliary scan with gallbladder ejection fraction may be useful. 2. Heterogeneous hepatic echotexture without evidence of parenchymal masses or ductal dilation. Very mild splenomegaly. No ascites. 3. No hydronephrosis nor evidence of significant renal atrophy nor cortical echotexture changes. Electronically Signed   By: David  SwazilandJordan M.D.   On: 02/13/2016 08:35    Anti-infectives: Anti-infectives    Start     Dose/Rate Route Frequency Ordered Stop   02/03/16 1015  ciprofloxacin (CIPRO) tablet 500 mg  Status:  Discontinued     500 mg Oral 2 times daily 02/03/16 1007 02/08/16 1413   02/01/16 1600  Ampicillin-Sulbactam (UNASYN) 3 g in sodium chloride 0.9 % 100 mL IVPB  Status:  Discontinued     3 g 200 mL/hr over 30 Minutes Intravenous Every 6 hours 02/01/16 1520 02/02/16 1158   02/01/16 1000  ciprofloxacin (CIPRO) IVPB 400 mg  Status:  Discontinued     400 mg 200 mL/hr over 60 Minutes Intravenous Every 12 hours 02/01/16 0926 02/03/16 1007    02/01/16 1000  ceFAZolin (ANCEF) IVPB 1 g/50 mL premix  Status:  Discontinued     1 g 100 mL/hr over 30 Minutes Intravenous Every 8 hours 02/01/16 0926 02/01/16 1520   01/29/16 1330  ciprofloxacin (CIPRO) tablet 500 mg  Status:  Discontinued     500 mg Oral 2 times daily 01/29/16 1131 02/01/16 0926   01/29/16 1330  amoxicillin-clavulanate (AUGMENTIN) 875-125 MG per tablet 1 tablet  Status:  Discontinued     1 tablet Oral Every 12 hours 01/29/16 1131 02/01/16 0926   01/29/16 0800  vancomycin (VANCOCIN) 1,250 mg in sodium chloride 0.9 % 250 mL IVPB  Status:  Discontinued     1,250 mg 166.7 mL/hr over 90 Minutes Intravenous Every 24 hours 01/29/16 0203 01/29/16 1131   01/26/16 0600  piperacillin-tazobactam (ZOSYN) IVPB 3.375 g  Status:  Discontinued     3.375 g 12.5 mL/hr over 240 Minutes Intravenous Every 8 hours 01/25/16 2009 01/29/16 1131   01/26/16 0200  vancomycin (VANCOCIN) 1,500 mg in sodium chloride 0.9 % 500 mL IVPB  Status:  Discontinued     1,500 mg 250 mL/hr over 120 Minutes Intravenous Every 24 hours 01/25/16 2008 01/29/16 0200   01/25/16 2200  piperacillin-tazobactam (ZOSYN) IVPB 3.375 g  Status:  Discontinued     3.375 g 12.5 mL/hr over 240 Minutes Intravenous Every 8 hours 01/25/16 1751 01/25/16 1753   01/25/16 1830  vancomycin (VANCOCIN) IVPB 1000 mg/200 mL premix     1,000 mg 200 mL/hr over 60 Minutes Intravenous  Once 01/25/16 1752 01/25/16 2105   01/25/16 1830  piperacillin-tazobactam (ZOSYN) IVPB 3.375 g     3.375 g 12.5 mL/hr over 240 Minutes Intravenous  Once 01/25/16 1753 01/26/16 0005   01/25/16 1800  vancomycin (VANCOCIN) IVPB 1000 mg/200 mL premix  Status:  Discontinued     1,000 mg 200 mL/hr over 60 Minutes Intravenous Every 24 hours 01/25/16 1751 01/25/16 1753      Assessment/Plan: s/p Procedure(s): INVASIVE LAB ABORTED/CANCEL CASE Assessment: Osteomyelitis and ulceration status post debridement  Plan:Santyl ointment and a wet-to-dry dressing applied to  the right heel ulceration followed by sterile forefoot. We will continue every other day dressing changes once he is discharged  from the hospital. Discussed with the patient that at this point his foot appears stable but that if anything takes a turn for the worse we do not have any good options for reoperating since he was unable to have his heart catheterization and anesthesia would not willing to put him to sleep. Plan for follow-up in 1-2 weeks outpatient  LOS: 19 days    Ricci Barkerodd W Hipolito Martinezlopez 02/13/2016

## 2016-02-13 NOTE — Clinical Social Work Note (Signed)
MD has informed CSW that patient will be ready for discharge tomorrow. CSW has called the admission's coordinator: Lorre Munroeracy Stevens and had to leave a message for her to call me regarding patient's impending discharge for tomorrow and to ensure patient still had a bed there.  York SpanielMonica Merryn Thaker MSW,LCSW [phone]

## 2016-02-13 NOTE — Progress Notes (Signed)
Wean to room air but pt reports feeling SOB. NO pain. Takes meds ok. NSR. Pt needs assistance with feeding. Incontinent. [facility]. Pt has no further concerns at this time.

## 2016-02-13 NOTE — Progress Notes (Signed)
Chaplain went to follow up with the  Patient. Patient said that his health continues to decline, but his faith in God was still strong. Patient said, "the Shaune PollackLord is my Master." Chaplain asked him what he meant and patient said that God never left him despite the pain he is enduring.

## 2016-02-13 NOTE — Progress Notes (Signed)
Nutrition Follow-up  DOCUMENTATION CODES:   Severe malnutrition in context of acute illness/injury  INTERVENTION:  Continue Magic Cup TID, Kozy shack BID, Glucerna Shake TID Megace BID per MD  NUTRITION DIAGNOSIS:   Malnutrition related to acute illness as evidenced by energy intake < or equal to 50% for > or equal to 5 days, percent weight loss. -ongoing GOAL:   Patient will meet greater than or equal to 90% of their needs -not meeting MONITOR:   PO intake, Supplement acceptance, I & O's, Labs, Weight trends  REASON FOR ASSESSMENT:   Malnutrition Screening Tool    ASSESSMENT:   Jason [name]  is a 82 y.o. male with a known history of type 2 diabetes, insulin requiring who is presenting from podiatry clinic for further management of right foot ulcer.   Jason [name] continues to consume little to no food.  No desire to eat. Did work with PT today.  Overall pt's chances of getting better while here seem to be low. Labs and medications reviewed  Diet Order:  Diet heart healthy/carb modified Room service appropriate? Yes; Fluid consistency: Thin  Skin:  Wound (see comment) (osteomyelitis of R foot)  Last BM:  11/16  Height:   Ht Readings from Last 1 Encounters:  02/02/16 5\' 8"  (1.727 m)    Weight:   Wt Readings from Last 1 Encounters:  02/13/16 258 lb 11.2 oz (117.3 kg)    Ideal Body Weight:  70 kg  BMI:  Body mass index is 39.34 kg/m.  Estimated Nutritional Needs:   Kcal:  2000-2450 calories (25-30 cal/kg ABW)  Protein:  117-140 gm  Fluid:  >/= 2L  EDUCATION NEEDS:   No education needs identified at this time  Dionne AnoWilliam M. Dezarae Mcclaran, MS, RD LDN Inpatient Clinical Dietitian Pager [phone]

## 2016-02-13 NOTE — Progress Notes (Signed)
[facility] [place] [facility]   PATIENT NAME: Jason [name] Califano    MRN#:  [id number]  DATE OF BIRTH:  24-Apr-1932  SUBJECTIVE:  Hospital Day: 19 days Jason [name] Benedicto is a 82 y.o. male presenting with .Foot pain, and chronic wound  Overnight events: no overnight events Interval Events: no complaints, 4L [place] sats 100% No Family at bedside    REVIEW OF SYSTEMS:  CONSTITUTIONAL: No fever,positive fatigue or weakness.  EYES: No blurred or double vision.  EARS, NOSE, AND THROAT: No tinnitus or ear pain.  RESPIRATORY: No cough, Positive shortness of breath, denies wheezing or hemoptysis.  CARDIOVASCULAR: No chest pain, orthopnea, edema.  GASTROINTESTINAL: No nausea, vomiting, diarrhea or abdominal pain.  GENITOURINARY: No dysuria, hematuria.  ENDOCRINE: No polyuria, nocturia,  HEMATOLOGY: No anemia, easy bruising or bleeding SKIN: No rash or lesion. MUSCULOSKELETAL: No joint pain or arthritis.   NEUROLOGIC: No tingling, numbness, weakness.  PSYCHIATRY: No anxiety or depression.   DRUG ALLERGIES:  No Known Allergies  VITALS:  Blood pressure (!) 111/52, pulse 96, temperature 97.8 F (36.6 C), temperature source Oral, resp. rate 12, height 5\' 8"  (1.727 m), weight 117.3 kg (258 lb 11.2 oz), SpO2 91 %.  PHYSICAL EXAMINATION:  VITAL SIGNS: Vitals:   02/13/16 0414 02/13/16 1127  BP: (!) 104/35 (!) 111/52  Pulse: 92 96  Resp: 18 12  Temp: 97.7 F (36.5 C) 97.8 F (36.6 C)   GENERAL:83 y.o.male currently in no acute distress. Appears more lethargic today HEAD: Normocephalic, atraumatic.  EYES: Pupils equal, round, reactive to light. Extraocular muscles intact. No scleral icterus.  MOUTH: Moist mucosal membrane. Dentition intact. No abscess noted.  EAR, NOSE, THROAT: Clear without exudates. No external lesions.  NECK: Supple. No thyromegaly. No nodules. No JVD.  PULMONARY: Greatly diminished breath sounds without wheeze rails or rhonci. No use of accessory  muscles, Good respiratory effort. good air entry bilaterally CHEST: Nontender to palpation.  CARDIOVASCULAR: S1 and S2. Regular rate and rhythm. No murmurs, rubs, or gallops. No edema. Pedal pulses 2+ bilaterally.  GASTROINTESTINAL: Soft, nontender, nondistended. No masses. Positive bowel sounds. No hepatosplenomegaly.  MUSCULOSKELETAL: No swelling, clubbing, or edema. Range of motion full in all extremities.  NEUROLOGIC: Cranial nerves II through XII are intact. No gross focal neurological deficits. Sensation intact. Reflexes intact.  SKIN: No ulceration, lesions, rashes, or cyanosis. Skin warm and dry. Turgor intact.  PSYCHIATRIC: Mood, affect within normal limits. The patient is awake, alert and oriented x 3. Insight, judgment intact.      LABORATORY PANEL:   CBC  Recent Labs Lab 02/13/16 0614  WBC 9.1  HGB 9.6*  HCT 28.6*  PLT 110*   ------------------------------------------------------------------------------------------------------------------  Chemistries   Recent Labs Lab 02/13/16 0434  NA 136  K 3.7  CL 95*  CO2 36*  GLUCOSE 303*  BUN 39*  CREATININE 1.46*  CALCIUM 7.8*   ------------------------------------------------------------------------------------------------------------------  Cardiac Enzymes  Recent Labs Lab 02/07/16 0714  TROPONINI 0.88*   ------------------------------------------------------------------------------------------------------------------  RADIOLOGY:  [place] Abdomen Complete  Result Date: 02/13/2016 CLINICAL DATA:  Acute renal injury, possible cirrhosis. EXAM: ABDOMEN ULTRASOUND COMPLETE COMPARISON:  None in PACs FINDINGS: Gallbladder: The gallbladder is adequately distended. The gallbladder wall is irregularly thickened measuring up to 3.5 mm. There is echogenic sludge versus tiny stones. There is no pericholecystic fluid or positive sonographic Murphy's sign. Common bile duct: Diameter: 4.4 mm Liver: The hepatic echotexture is  heterogeneous and increased. The surface contour of the liver remains smooth. There is no  focal mass nor ductal dilation. IVC: No abnormality visualized. Pancreas: The pancreatic duct is mildly prominent at 3.8 mm. The observed portions of the pancreatic parenchyma appear normal however. Spleen: The spleen is mildly increased measuring 12 x 13 x 5 cm corresponding to a volume of 440 cc Right Kidney: Length: 11 cm. Echogenicity within normal limits. No mass or hydronephrosis visualized. Left Kidney: Length: 13.2 cm. Echogenicity within normal limits. No mass or hydronephrosis visualized. Abdominal aorta: Bowel gas obscured the distal abdominal aorta. The proximal and mid aorta exhibit normal caliber. Other findings: No ascites is observed. IMPRESSION: 1. Gallbladder sludge versus tiny stones with minimal gallbladder wall thickening. The findings may reflect chronic cholecystitis. A nuclear medicine hepatobiliary scan with gallbladder ejection fraction may be useful. 2. Heterogeneous hepatic echotexture without evidence of parenchymal masses or ductal dilation. Very mild splenomegaly. No ascites. 3. No hydronephrosis nor evidence of significant renal atrophy nor cortical echotexture changes. Electronically Signed   By: David  SwazilandJordan M.D.   On: 02/13/2016 08:35    ASSESSMENT AND PLAN:   Jason [name] Sturdivant is a 82 y.o. male presenting with Foot wound.  Admitted 01/25/2016 : Day #: 19 days,  underwent surgery 11/11-amputation right great toe with excisional debridement Patient developed chest pain 11/13 Desaturations, respiratory failure followed 11/15: Recurrent chest pain started on heparin and nitroglycerin transferred to ICU-NSTEMI 11/16: Plan cardiac catheterization patient is unable to lay flat secondary to hypoxia-cardiac catheterization deferred 11/17: Transferred out of ICU, heparin drip stopped Since that time has been essentially dealing with acute on chronic diastolic congestive heart failure but has  made minimal progress  1. Acute on chronic diastolic congestive heart failure: Continue oxygen as required, and Lasix decreased to 20 mg twice a day 2. NSTEMI: Cardiac catheterization has been deferred as above, now on setting of worsening renal function it remains  a poor decision, continue aspirin and statin therapy on Plavix, stopped therapeutic Lovenox  3. Acute kidney injury: Appreciate nephrology input. We will carefully add Midrin 4. Type 2 diabetes: Poorly controlled increase basal insulin and continue sliding scale coverage 5. Right foot osteomyelitis: Has completed course of antibiotics Will d/c PICC in am  PT recommends SNF D/c to snf Assess for need of oxygen at d/c   Overall poor prognosis, not really progressing, not working with PT, palliative care involved   All the records are reviewed and case discussed with Care Management/Social Workerr. Management plans discussed with the patient, family and they are in agreement.  CODE STATUS: dnr TOTAL TIME TAKING CARE OF THIS PATIENT: 33 minutes.   POSSIBLE D/C IN  1DAYS, DEPENDING ON CLINICAL CONDITION.   Arbie Reisz M.D on 02/13/2016 at 5:49 PM  Between 7am to 6pm - Pager - [phone] After 6pm: House Pager: - [phone]  [facility]  Office  [phone]  CC: Primary care physician; Dione Housekeeperlmedo, Mario Ernesto, MD

## 2016-02-13 NOTE — Clinical Social Work Note (Signed)
[facility]. York SpanielMonica Sanjith Siwek MSW,LCSW [phone]

## 2016-02-13 NOTE — Progress Notes (Addendum)
Inpatient Diabetes Program Recommendations  AACE/ADA: New Consensus Statement on Inpatient Glycemic Control (2015)  Target Ranges:  Prepandial:   less than 140 mg/dL      Peak postprandial:   less than 180 mg/dL (1-2 hours)      Critically ill patients:  140 - 180 mg/dL   Results for Jason [name], Jason [name] (MRN [id number]) as of 02/13/2016 11:21  Ref. Range 02/12/2016 07:32 02/12/2016 11:25 02/12/2016 16:41 02/12/2016 21:14  Glucose-Capillary Latest Ref Range: 65 - 99 mg/dL 409236 (H) 811242 (H) 914322 (H) 382 (H)   Results for Jason [name], Jason [name] (MRN [id number]) as of 02/13/2016 11:21  Ref. Range 02/13/2016 08:43  Glucose-Capillary Latest Ref Range: 65 - 99 mg/dL 086260 (H)    Home DM Meds: Lantus 90 units QPM       Humalog 20 units TID        Humalog SSI  Current Insulin Orders: Lantus 40 units QPM      Novolog Moderate Correction Scale/ SSI (0-15 units) TID AC + HS      Novolog 5 units TIDWC      MD- Please consider the following in-hospital insulin adjustments:  1. Increase Lantus to 65 units QPM (~75% home dose of Lantus)  2. Increase Novolog Meal Coverage to: Novolog 10 units TIDWC (hold if pt eats <50% of meal)      --Will follow patient during hospitalization--  Ambrose FinlandJeannine Johnston Avira Tillison RN, MSN, CDE Diabetes Coordinator Inpatient Glycemic Control Team Team Pager: [phone] (8a-5p)

## 2016-02-14 LAB — GLUCOSE, CAPILLARY
GLUCOSE-CAPILLARY: 215 mg/dL — AB (ref 65–99)
GLUCOSE-CAPILLARY: 266 mg/dL — AB (ref 65–99)

## 2016-02-14 MED ORDER — GLUCERNA SHAKE PO LIQD: 237.0000 mL | Freq: Three times a day (TID) | ORAL | 0 refills | Status: DC

## 2016-02-14 MED ORDER — INSULIN ASPART 100 UNIT/ML ~~LOC~~ SOLN
10.0000 [IU] | Freq: Three times a day (TID) | SUBCUTANEOUS | Status: DC
  Administered 2016-02-14 (×2): 10 [IU] via SUBCUTANEOUS
  Filled 2016-02-14 (×2): qty 10

## 2016-02-14 MED ORDER — OMEPRAZOLE 20 MG PO CPDR: 20.0000 mg | DELAYED_RELEASE_CAPSULE | Freq: Two times a day (BID) | ORAL | 0 refills | Status: DC

## 2016-02-14 MED ORDER — MIDODRINE HCL 5 MG PO TABS: 5.0000 mg | ORAL_TABLET | Freq: Three times a day (TID) | ORAL | 0 refills | Status: DC

## 2016-02-14 MED ORDER — FUROSEMIDE 20 MG PO TABS
20.0000 mg | ORAL_TABLET | Freq: Two times a day (BID) | ORAL | Status: DC
  Administered 2016-02-14: 20 mg via ORAL
  Filled 2016-02-14: qty 1

## 2016-02-14 MED ORDER — INSULIN GLARGINE 100 UNIT/ML ~~LOC~~ SOLN
45.0000 [IU] | Freq: Every day | SUBCUTANEOUS | Status: DC
  Filled 2016-02-14: qty 0.45

## 2016-02-14 MED ORDER — INSULIN ASPART 100 UNIT/ML ~~LOC~~ SOLN: 10.0000 [IU] | Freq: Three times a day (TID) | SUBCUTANEOUS | 11 refills | Status: DC

## 2016-02-14 MED ORDER — NITROGLYCERIN 0.4 MG SL SUBL: 0.4000 mg | SUBLINGUAL_TABLET | SUBLINGUAL | 1 refills | Status: DC | PRN

## 2016-02-14 MED ORDER — COLLAGENASE 250 UNIT/GM EX OINT: TOPICAL_OINTMENT | CUTANEOUS | 0 refills | Status: DC

## 2016-02-14 MED ORDER — LANTUS SOLOSTAR 100 UNIT/ML ~~LOC~~ SOPN: 50.0000 [IU] | PEN_INJECTOR | Freq: Every day | SUBCUTANEOUS | 11 refills | Status: AC

## 2016-02-14 MED ORDER — FUROSEMIDE 20 MG PO TABS
20.0000 mg | ORAL_TABLET | Freq: Two times a day (BID) | ORAL | 1 refills | Status: DC
Start: 2016-02-14 — End: 2016-03-07

## 2016-02-14 NOTE — Clinical Social Work Placement (Signed)
   CLINICAL SOCIAL WORK PLACEMENT  NOTE  Date:  02/14/2016  Patient Details  Name: Almon HerculesMyron L Swailes MRN: [id number] Date of Birth: [birth year]  Clinical Social Work is seeking post-discharge placement for this patient at the Skilled  Nursing Facility level of care (*CSW will initial, date and re-position this form in  chart as items are completed):  Yes   Patient/family provided with [facility] Clinical Social Work Department's list of facilities offering this level of care within the geographic area requested by the patient (or if unable, by the patient's family).  Yes   Patient/family informed of their freedom to choose among providers that offer the needed level of care, that participate in Medicare, Medicaid or managed care program needed by the patient, have an available bed and are willing to accept the patient.  Yes   Patient/family informed of [facility]'s ownership interest in [facility] and [facility], as well as of the fact that they are under no obligation to receive care at these facilities.  PASRR submitted to EDS on       PASRR number received on       Existing PASRR number confirmed on 02/14/16     FL2 transmitted to all facilities in geographic area requested by pt/family on 02/13/16     FL2 transmitted to all facilities within larger geographic area on       Patient informed that his/her managed care company has contracts with or will negotiate with certain facilities, including the following:        Yes   Patient/family informed of bed offers received.  Patient chooses bed at  [facility] )     Physician recommends and patient chooses bed at      Patient to be transferred to  [facility] ) on 02/14/16.  Patient to be transferred to facility by  [facility] )     Patient family notified on 02/14/16 of transfer.  Name of family member notified:   (Patient's wife Kathie RhodesBetty is aware of D/C today. )     PHYSICIAN       Additional Comment:     _______________________________________________ Bushra Denman, Darleen CrockerBailey M, LCSW 02/14/2016, 10:07 AM

## 2016-02-14 NOTE — Progress Notes (Signed)
Notified EMS regarding non-emergent [facility]; [facility].

## 2016-02-14 NOTE — Progress Notes (Signed)
[facility] to [facility].Spoke to wife earlier and she is aware of his discharge plans.

## 2016-02-14 NOTE — Discharge Summary (Addendum)
[facility] [place] NAME: Jason [name]    MR#:  [id number]  DATE OF BIRTH:  1932/07/23  DATE OF ADMISSION:  01/25/2016 ADMITTING PHYSICIAN: Lytle Butte, MD  DATE OF DISCHARGE: 02/14/16  PRIMARY CARE PHYSICIAN: Valera Castle, MD    ADMISSION DIAGNOSIS:  Osteomyelitis  N/A nstemi  DISCHARGE DIAGNOSIS:  Acute NSTEMI-medical management Right great to diabetic foot infection s/p debridement -CKD-III Uncontrolled Dm-2 Acute on chronic CHF,diastolic---now on oxygen Morbid obesity  SECONDARY DIAGNOSIS:   Past Medical History:  Diagnosis Date  . BPH (benign prostatic hyperplasia)   . Constipation   . COPD (chronic obstructive pulmonary disease) ([place])   . Diabetes mellitus without complication ([place])   . GERD (gastroesophageal reflux disease)   . HTN (hypertension)   . Hypothyroidism   . Stroke [facility])     HOSPITAL COURSE:   Jason [name] is a 82 y.o. male presenting with Foot wound.  Admitted 01/25/2016 : Day #: 19 days underwent surgery 11/11-amputation right great toe with excisional debridement Patient developed chest pain 11/13 Desaturations, respiratory failure followed 11/15: Recurrent chest pain started on heparin and nitroglycerin transferred to ICU-NSTEMI 11/16: Plan cardiac catheterization patient is unable to lay flat secondary to hypoxia-cardiac catheterization deferred 11/17: Transferred out of ICU, heparin drip stopped Since that time has been essentially dealing with acute on chronic diastolic congestive heart failure but has made minimal progress  1. Acute on chronic diastolic congestive heart failure: Continue oxygen as required, and Lasix decreased to 20 mg twice a day  2. NSTEMI: Cardiac catheterization has been deferred as above, now on setting of worsening renal function it remains  a poor decision, continue aspirin and statin therapy on Plavix, stopped therapeutic Lovenox   3. Acute on  chronic kidney injury-CKD-III: Appreciate nephrology input. We will carefully add Midrin Pt will f/u as oupt with nephrology  4. Type 2 diabetes: Poorly controlled increase basal insulin and continue sliding scale coverage -Lantus+aspart+SSI  5. Right foot osteomyelitis: Has completed course of antibiotics  d/c PICC today  PT recommends SNF D/c to snf today Pt will need oxygen prn to keep sats >92%. He likely has underlying sleep apnea as well  PALLIATIVE CARE to follow up at SNF CONSULTS OBTAINED:  Treatment Team:  Lytle Butte, MD Sharlotte Alamo, DPM Teodoro Spray, MD Leonel Ramsay, MD Murlean Iba, MD  DRUG ALLERGIES:  No Known Allergies  DISCHARGE MEDICATIONS:   Current Discharge Medication List    START taking these medications   Details  collagenase (SANTYL) ointment Apply topically 2 days. Qty: 15 g, Refills: 0    feeding supplement, GLUCERNA SHAKE, (GLUCERNA SHAKE) LIQD Take 237 mLs by mouth 3 (three) times daily between meals. Qty: 30 Can, Refills: 0    furosemide (LASIX) 20 MG tablet Take 1 tablet (20 mg total) by mouth 2 (two) times daily. Qty: 60 tablet, Refills: 1    insulin aspart (NOVOLOG) 100 UNIT/ML injection Inject 10 Units into the skin 3 (three) times daily with meals. Qty: 10 mL, Refills: 11    midodrine (PROAMATINE) 5 MG tablet Take 1 tablet (5 mg total) by mouth 3 (three) times daily with meals. Qty: 90 tablet, Refills: 0    nitroGLYCERIN (NITROSTAT) 0.4 MG SL tablet Place 1 tablet (0.4 mg total) under the tongue every 5 (five) minutes as needed for chest pain. Qty: 20 tablet, Refills: 1    sodium chloride 0.9 % infusion Inject 250 mLs into  the vein as needed (for IV line care(Saline / Heparin Lock)). Qty: 10 mL, Refills: 0      CONTINUE these medications which have CHANGED   Details  LANTUS SOLOSTAR 100 UNIT/ML Solostar Pen Inject 50 Units into the skin daily. subQ at 1630 with dinner Qty: 15 mL, Refills: 11    omeprazole  (PRILOSEC) 20 MG capsule Take 1 capsule (20 mg total) by mouth 2 (two) times daily. Qty: 30 capsule, Refills: 0      CONTINUE these medications which have NOT CHANGED   Details  albuterol (PROVENTIL HFA;VENTOLIN HFA) 108 (90 Base) MCG/ACT inhaler Inhale 2 puffs into the lungs.     Amino Acids-Protein Hydrolys (FEEDING SUPPLEMENT, PRO-STAT SUGAR FREE 64,) LIQD Take 30 mLs by mouth 3 (three) times daily with meals.    aspirin 81 MG tablet Take 81 mg by mouth daily.    budesonide-formoterol (SYMBICORT) 160-4.5 MCG/ACT inhaler Inhale 2 puffs into the lungs 2 (two) times daily.    clopidogrel (PLAVIX) 75 MG tablet Take 1 tablet (75 mg total) by mouth daily. Qty: 30 tablet, Refills: 5    docusate sodium (COLACE) 100 MG capsule Take 200 mg by mouth 2 (two) times daily.    FLUoxetine (PROZAC) 40 MG capsule Take 40 mg by mouth daily.    insulin lispro (HUMALOG) 100 UNIT/ML injection Inject 20 Units into the skin. 0-150=0 151-200=2unit 201-250=4units 251-300=6 units 301-350=8 units > 351= 10 units & recheck in 2 hours    senna (SENOKOT) 8.6 MG TABS tablet Take 2 tablets by mouth 2 (two) times daily.    simvastatin (ZOCOR) 20 MG tablet Take 20 mg by mouth at bedtime.    SYNTHROID 125 MCG tablet Take 125 mcg by mouth daily.    tamsulosin (FLOMAX) 0.4 MG CAPS capsule Take 0.4 mg by mouth daily.    traMADol (ULTRAM) 50 MG tablet Take 50 mg by mouth 4 (four) times daily as needed.    blood glucose meter kit and supplies KIT Dispense based on patient and insurance preference. Use up to four times daily as directed. (FOR ICD-9 250.00, 250.01). Qty: 1 each, Refills: 0    oxyCODONE (OXY IR/ROXICODONE) 5 MG immediate release tablet Take 1 tablet (5 mg total) by mouth every 4 (four) hours as needed for moderate pain. Qty: 30 tablet, Refills: 0      STOP taking these medications     ADVAIR DISKUS 250-50 MCG/DOSE AEPB         If you experience worsening of your admission symptoms,  develop shortness of breath, life threatening emergency, suicidal or homicidal thoughts you must seek medical attention immediately by calling 911 or calling your MD immediately  if symptoms less severe.  You Must read complete instructions/literature along with all the possible adverse reactions/side effects for all the Medicines you take and that have been prescribed to you. Take any new Medicines after you have completely understood and accept all the possible adverse reactions/side effects.   Please note  You were cared for by a hospitalist during your hospital stay. If you have any questions about your discharge medications or the care you received while you were in the hospital after you are discharged, you can call the unit and asked to speak with the hospitalist on call if the hospitalist that took care of you is not available. Once you are discharged, your primary care physician will handle any further medical issues. Please note that NO REFILLS for any discharge medications will be authorized once  you are discharged, as it is imperative that you return to your primary care physician (or establish a relationship with a primary care physician if you do not have one) for your aftercare needs so that they can reassess your need for medications and monitor your lab values. Today   SUBJECTIVE   No new events per RN or complaints  VITAL SIGNS:  Blood pressure (!) 118/36, pulse 93, temperature 97.6 F (36.4 C), temperature source Oral, resp. rate 18, height 5' 8" (1.727 m), weight 117.6 kg (259 lb 3.2 oz), SpO2 94 %.  I/O:    Intake/Output Summary (Last 24 hours) at 02/14/16 0951 Last data filed at 02/14/16 0520  Gross per 24 hour  Intake                0 ml  Output                0 ml  Net                0 ml    PHYSICAL EXAMINATION:  GENERAL:  82 y.o.-year-old patient lying in the bed with no acute distress. obese EYES: Pupils equal, round, reactive to light and accommodation. No  scleral icterus. Extraocular muscles intact.  HEENT: Head atraumatic, normocephalic. Oropharynx and nasopharynx clear.  NECK:  Supple, no jugular venous distention. No thyroid enlargement, no tenderness.  LUNGS: Normal breath sounds bilaterally, no wheezing, rales,rhonchi or crepitation. No use of accessory muscles of respiration. Decreased bs in the bases CARDIOVASCULAR: S1, S2 normal. No murmurs, rubs, or gallops.  ABDOMEN: Soft, non-tender, non-distended. Bowel sounds present. No organomegaly or mass.  EXTREMITIES:+ pedal edema, no cyanosis, or clubbing. Post surgical right foot dressing+ NEUROLOGIC: Cranial nerves II through XII are intact. Muscle strength 4/5 in all extremities. Sensation intact. Gait not checked. Focal weakness PSYCHIATRIC: The patient is alert and oriented x 3.  SKIN: No obvious rash, lesion, or ulcer.   DATA REVIEW:   CBC   Recent Labs Lab 02/13/16 0614  WBC 9.1  HGB 9.6*  HCT 28.6*  PLT 110*    Chemistries   Recent Labs Lab 02/13/16 0434  NA 136  K 3.7  CL 95*  CO2 36*  GLUCOSE 303*  BUN 39*  CREATININE 1.46*  CALCIUM 7.8*    Microbiology Results   No results found for this or any previous visit (from the past 240 hour(s)).  RADIOLOGY:  US Abdomen Complete  Result Date: 02/13/2016 CLINICAL DATA:  Acute renal injury, possible cirrhosis. EXAM: ABDOMEN ULTRASOUND COMPLETE COMPARISON:  None in PACs FINDINGS: Gallbladder: The gallbladder is adequately distended. The gallbladder wall is irregularly thickened measuring up to 3.5 mm. There is echogenic sludge versus tiny stones. There is no pericholecystic fluid or positive sonographic Murphy's sign. Common bile duct: Diameter: 4.4 mm Liver: The hepatic echotexture is heterogeneous and increased. The surface contour of the liver remains smooth. There is no focal mass nor ductal dilation. IVC: No abnormality visualized. Pancreas: The pancreatic duct is mildly prominent at 3.8 mm. The observed portions  of the pancreatic parenchyma appear normal however. Spleen: The spleen is mildly increased measuring 12 x 13 x 5 cm corresponding to a volume of 440 cc Right Kidney: Length: 11 cm. Echogenicity within normal limits. No mass or hydronephrosis visualized. Left Kidney: Length: 13.2 cm. Echogenicity within normal limits. No mass or hydronephrosis visualized. Abdominal aorta: Bowel gas obscured the distal abdominal aorta. The proximal and mid aorta exhibit normal caliber. Other findings: No ascites is  observed. IMPRESSION: 1. Gallbladder sludge versus tiny stones with minimal gallbladder wall thickening. The findings may reflect chronic cholecystitis. A nuclear medicine hepatobiliary scan with gallbladder ejection fraction may be useful. 2. Heterogeneous hepatic echotexture without evidence of parenchymal masses or ductal dilation. Very mild splenomegaly. No ascites. 3. No hydronephrosis nor evidence of significant renal atrophy nor cortical echotexture changes. Electronically Signed   By: David  Martinique M.D.   On: 02/13/2016 08:35     Management plans discussed with the patient, family and they are in agreement.  CODE STATUS:     Code Status Orders        Start     Ordered   02/07/16 1426  Do not attempt resuscitation (DNR)  Continuous    Question Answer Comment  In the event of cardiac or respiratory ARREST Do not call a "code blue"   In the event of cardiac or respiratory ARREST Do not perform Intubation, CPR, defibrillation or ACLS   In the event of cardiac or respiratory ARREST Use medication by any route, position, wound care, and other measures to relive pain and suffering. May use oxygen, suction and manual treatment of airway obstruction as needed for comfort.      02/07/16 1425    Code Status History    Date Active Date Inactive Code Status Order ID Comments User Context   01/25/2016  5:35 PM 02/07/2016  2:25 PM Full Code [id number]  Lytle Butte, MD Inpatient   01/10/2016  2:23 PM  01/10/2016  7:23 PM Full Code [id number]  Katha Cabal, MD Inpatient   10/13/2015  1:49 AM 10/15/2015  8:16 PM Full Code [id number]  Lance Coon, MD Inpatient      TOTAL TIME TAKING CARE OF THIS PATIENT: 40 minutes.    , M.D on 02/14/2016 at 9:51 AM  Between 7am to 6pm - Pager - [phone] After 6pm go to [email or web address] - password [email or web address] [facility]  Office  [phone]  CC: Primary care physician; Valera Castle, MD

## 2016-02-14 NOTE — Discharge Instructions (Signed)
Keep oxygen prn to keep sats >92% 2-2.5 liters/min PT Foot dressing per instructions  [facility]ALLIAT[facility] C[facility]

## 2016-02-14 NOTE — Progress Notes (Signed)
Patient discharged via EMS [facility]. Patient belongings and discharge information packet given to transporter.  PICC line removed per MD order. Notified receiving facility that patient was in route

## 2016-02-14 NOTE — [place] FL2 (Signed)
[place] MEDICAID FL2 LEVEL OF CARE SCREENING TOOL     IDENTIFICATION  Patient Name: Jason [name] Birthdate: [birth year] Sex: male Admission Date (Current Location): 01/25/2016  [place] and [place] Number:  Chiropodist and Address:  [facility], [address], [place], [place] [address]      Provider Number: [id number]  Attending Physician Name and Address:  Enedina Finner, MD  Relative Name and Phone Number:       Current Level of Care: Hospital Recommended Level of Care: Skilled Nursing Facility Prior Approval Number:    Date Approved/Denied:   PASRR Number:  [id number])  Discharge Plan: SNF    Current Diagnoses: Patient Active Problem List   Diagnosis Date Noted  . Palliative care encounter   . Goals of care, counseling/discussion   . Encounter for hospice care discussion   . Nausea without vomiting   . Chest pain   . Elevated troponin I level 01/30/2016  . Osteomyelitis of foot, right, acute (HCC) 01/25/2016  . Hyperlipidemia 01/02/2016  . PAD (peripheral artery disease) (HCC) 01/02/2016  . Skin ulcer of right foot with necrosis of muscle (HCC) 01/02/2016  . COPD (chronic obstructive pulmonary disease) (HCC) 10/13/2015  . GERD (gastroesophageal reflux disease) 10/13/2015  . Hypothyroidism 10/13/2015  . BPH (benign prostatic hyperplasia) 10/13/2015  . Diabetes (HCC) 10/13/2015  . Open toe fracture 10/13/2015  . Pressure ulcer 10/13/2015    Orientation RESPIRATION BLADDER Height & Weight     Self, Time, Situation, Place  Normal Incontinent Weight: 259 lb 3.2 oz (117.6 kg) Height:  5\' 8"  (172.7 cm)  BEHAVIORAL SYMPTOMS/MOOD NEUROLOGICAL BOWEL NUTRITION STATUS   (None.)  (None.) Continent Diet (Diet: Carb Modified)  AMBULATORY STATUS COMMUNICATION OF NEEDS Skin   Extensive Assist Verbally PU Stage and Appropriate Care (Right Diabetic Ulcer )                       Personal Care Assistance Level of Assistance   Bathing, Feeding, Dressing Bathing Assistance: Limited assistance Feeding assistance: Independent Dressing Assistance: Limited assistance     Functional Limitations Info  Sight, Hearing, Speech Sight Info: Adequate Hearing Info: Adequate Speech Info: Impaired ([place])    SPECIAL CARE FACTORS FREQUENCY  PT (By licensed PT), OT (By licensed OT)     PT Frequency:  (5) OT Frequency:  (5)            Contractures      Additional Factors Info  Code Status, Allergies, Insulin Sliding Scale Code Status Info:  (Full Code) Allergies Info:  (No Known Allergies)   Insulin Sliding Scale Info:  (NovoLog, Lantus)       Current Medications (02/14/2016):  This is the current hospital active medication list Current Facility-Administered Medications  Medication Dose Route Frequency Provider Last Rate Last Dose  . acetaminophen (TYLENOL) tablet 650 mg  650 mg Oral Q6H PRN Wyatt Haste, MD   650 mg at 01/28/16 2116   Or  . acetaminophen (TYLENOL) suppository 650 mg  650 mg Rectal Q6H PRN Wyatt Haste, MD      . alum & mag hydroxide-simeth (MAALOX/MYLANTA) 200-200-20 MG/5ML suspension 15 mL  15 mL Oral Q8H PRN Milagros Loll, MD   15 mL at 01/30/16 1647  . aspirin EC tablet 81 mg  81 mg Oral Daily Wyatt Haste, MD   81 mg at 02/13/16 0908  . clopidogrel (PLAVIX) tablet 75 mg  75 mg Oral Daily Cletis Athens  Hower, MD   75 mg at 02/13/16 0908  . collagenase (SANTYL) ointment   Topical 2 days Wyatt Hasteavid K Hower, MD      . famotidine (PEPCID) tablet 20 mg  20 mg Oral Daily Katha HammingSnehalatha Konidena, MD   20 mg at 02/13/16 0908  . feeding supplement (GLUCERNA SHAKE) (GLUCERNA SHAKE) liquid 237 mL  237 mL Oral TID BM Wyatt Hasteavid K Hower, MD   237 mL at 02/13/16 1400  . FLUoxetine (PROZAC) capsule 40 mg  40 mg Oral Daily Wyatt Hasteavid K Hower, MD   40 mg at 02/13/16 0908  . furosemide (LASIX) tablet 20 mg  20 mg Oral BID Enedina FinnerSona Patel, MD      . heparin injection 5,000 Units  5,000 Units Subcutaneous Q8H Wyatt Hasteavid K Hower,  MD   5,000 Units at 02/14/16 0455  . insulin aspart (novoLOG) injection 0-15 Units  0-15 Units Subcutaneous TID WC Wyatt Hasteavid K Hower, MD   8 Units at 02/13/16 1733  . insulin aspart (novoLOG) injection 0-5 Units  0-5 Units Subcutaneous QHS Wyatt Hasteavid K Hower, MD   3 Units at 02/13/16 2207  . insulin aspart (novoLOG) injection 10 Units  10 Units Subcutaneous TID WC Enedina FinnerSona Patel, MD      . insulin glargine (LANTUS) injection 45 Units  45 Units Subcutaneous Q supper Enedina FinnerSona Patel, MD      . ipratropium-albuterol (DUONEB) 0.5-2.5 (3) MG/3ML nebulizer solution 3 mL  3 mL Nebulization Q6H PRN Katha HammingSnehalatha Konidena, MD   3 mL at 02/12/16 1942  . levothyroxine (SYNTHROID, LEVOTHROID) tablet 125 mcg  125 mcg Oral Q0600 Wyatt Hasteavid K Hower, MD   125 mcg at 02/14/16 0458  . megestrol (MEGACE) 400 MG/10ML suspension 200 mg  200 mg Oral BID Ramonita LabAruna Gouru, MD   200 mg at 02/13/16 2208  . midodrine (PROAMATINE) tablet 5 mg  5 mg Oral TID WC Mosetta PigeonHarmeet Singh, MD   5 mg at 02/13/16 1732  . mometasone-formoterol (DULERA) 200-5 MCG/ACT inhaler 2 puff  2 puff Inhalation BID Wyatt Hasteavid K Hower, MD   2 puff at 02/13/16 2207  . nitroGLYCERIN (NITROSTAT) SL tablet 0.4 mg  0.4 mg Sublingual Q5 min PRN Katha HammingSnehalatha Konidena, MD   0.4 mg at 02/06/16 1903  . ondansetron (ZOFRAN) injection 4 mg  4 mg Intravenous Q6H PRN Enedina FinnerSona Patel, MD   4 mg at 02/10/16 0857  . oxyCODONE-acetaminophen (PERCOCET/ROXICET) 5-325 MG per tablet 1-2 tablet  1-2 tablet Oral Q4H PRN Linus Galasodd Cline, DPM   2 tablet at 01/29/16 1503  . pantoprazole (PROTONIX) EC tablet 40 mg  40 mg Oral Daily Wyatt Hasteavid K Hower, MD   40 mg at 02/13/16 0908  . senna-docusate (Senokot-S) tablet 2 tablet  2 tablet Oral BID Enedina FinnerSona Patel, MD   2 tablet at 02/13/16 2209  . simvastatin (ZOCOR) tablet 20 mg  20 mg Oral QHS Wyatt Hasteavid K Hower, MD   20 mg at 02/13/16 2209  . sodium chloride flush (NS) 0.9 % injection 10-40 mL  10-40 mL Intracatheter Q12H Katha HammingSnehalatha Konidena, MD   10 mL at 02/13/16 2210  . sodium chloride flush  (NS) 0.9 % injection 10-40 mL  10-40 mL Intracatheter PRN Katha HammingSnehalatha Konidena, MD   10 mL at 02/09/16 0853  . tamsulosin (FLOMAX) capsule 0.4 mg  0.4 mg Oral Daily Wyatt Hasteavid K Hower, MD   0.4 mg at 02/13/16 [id number]     Discharge Medications: Please see discharge summary for a list of discharge medications.  Relevant Imaging Results:  Relevant Lab Results:  Additional Information  (SSN: [id number])  Iverna Hammac, Darleen CrockerBailey M, LCSW

## 2016-02-14 NOTE — Care Management (Signed)
For discharge to skilled nursing.  Notified attending that palliative care is to follow at the facility

## 2016-02-14 NOTE — Progress Notes (Signed)
New referral for Pall[facility] received from [facility]. Plan is for discharge today. [facility] referral made aware. Thank you. Dayna BarkerKaren Robertson RN, BSN, [facility], [facility] Liaison [phone]

## 2016-02-14 NOTE — Progress Notes (Addendum)
Patient is medically stable for discharge today to [facility]. Per, [facility] admissions coordinator at [facility], patient can come to room 321. Patient will be followed by palliative at facility. Santiago Glad, palliative liaison is aware of above.  Social work Theatre manager send discharge orders in [place]. Social work Theatre manager met with patient at bedside and explained that patient will discharge today to [place]. Patient verbally agreed he understood. Social work Theatre manager spoke with patient's spouse and made her aware of patient's discharge today. Social work Theatre manager also spoke with patient's son making him aware of patient's discharge. Patient's wife requested that RN call her when EMS is here to pick patient up. RN is aware of above. RN will call report and arrange EMS for transport. Please re-consult if future social work needs arise. Social work Architectural technologist off.   Danie Chandler, Social Work Intern  [phone]

## 2016-02-14 NOTE — Care Management Important Message (Signed)
Important Message  Patient Details  Name: Jason [name] MRN: [id number] Date of Birth: 1932/07/07   Medicare Important Message Given:  Yes    Eber HongGreene, Corianne Buccellato R, RN 02/14/2016, 12:04 PM

## 2016-03-07 ENCOUNTER — Other Ambulatory Visit (INDEPENDENT_AMBULATORY_CARE_PROVIDER_SITE_OTHER): Payer: Self-pay | Admitting: Vascular Surgery

## 2016-03-07 ENCOUNTER — Ambulatory Visit (INDEPENDENT_AMBULATORY_CARE_PROVIDER_SITE_OTHER): Payer: Medicare Other | Admitting: Vascular Surgery

## 2016-03-07 ENCOUNTER — Encounter (INDEPENDENT_AMBULATORY_CARE_PROVIDER_SITE_OTHER): Payer: Self-pay | Admitting: Vascular Surgery

## 2016-03-07 ENCOUNTER — Encounter (INDEPENDENT_AMBULATORY_CARE_PROVIDER_SITE_OTHER): Payer: Self-pay

## 2016-03-07 VITALS — BP 114/53 | HR 94 | Resp 16

## 2016-03-07 DIAGNOSIS — I739 Peripheral vascular disease, unspecified: Secondary | ICD-10-CM | POA: Diagnosis not present

## 2016-03-07 DIAGNOSIS — E785 Hyperlipidemia, unspecified: Secondary | ICD-10-CM

## 2016-03-07 DIAGNOSIS — L97513 Non-pressure chronic ulcer of other part of right foot with necrosis of muscle: Secondary | ICD-10-CM

## 2016-03-07 DIAGNOSIS — E118 Type 2 diabetes mellitus with unspecified complications: Secondary | ICD-10-CM

## 2016-03-07 NOTE — Progress Notes (Signed)
Subjective:    Patient ID: Jason [name], male    DOB: Jan 24, 1933, 82 y.o.   MRN: [id number] Chief Complaint  Patient presents with  . Wound Check   Patient presents at request of Dr. Vickki Muff for a right lower extremity non-healing ulceration (foot). Patient underwent partial first ray amputation in early November 2017 for exposed bone. Since then he has had worsening wound / ulcer formation on the skin flap covering the first metatarsal. He also has necrosis of the fifth metatarsal and right heel. These areas are not showing any signs of healing. He currently resides in a nursing facility. Last endovascular intervention was on 01/10/16 (RLE angiogram). Patient did not follow up after intervention. He denies any fever, nausea or vomiting. No cellulitis to foot. No drainage noted.    Review of Systems  Constitutional: Negative.   HENT: Negative.   Eyes: Negative.   Respiratory: Negative.   Cardiovascular: Negative.   Gastrointestinal: Negative.   Endocrine: Negative.   Genitourinary: Negative.   Musculoskeletal: Negative.   Skin:       Right Foot Wounds x3  Allergic/Immunologic: Negative.   Neurological: Negative.   Hematological: Negative.   Psychiatric/Behavioral: Negative.       Objective:   Physical Exam  Constitutional: He is oriented to person, place, and time.  Debilitated. In wheelchair.   HENT:  Head: Normocephalic and atraumatic.  Right Ear: External ear normal.  Left Ear: External ear normal.  Eyes: Conjunctivae and EOM are normal. Pupils are equal, round, and reactive to light.  Neck: Normal range of motion.  Cardiovascular: Normal rate, regular rhythm and normal heart sounds.   Pulses:      Radial pulses are 2+ on the right side, and 2+ on the left side.       Dorsalis pedis pulses are 0 on the right side, and 1+ on the left side.       Posterior tibial pulses are 0 on the right side, and 1+ on the left side.  Pulmonary/Chest: Effort normal and breath sounds  normal.  Abdominal: Soft. Bowel sounds are normal.  Musculoskeletal: Normal range of motion. He exhibits edema (Right Foot).  Neurological: He is alert and oriented to person, place, and time.  Skin:  Amputation site, heel and fifth ray with ulceration. Non-infected. No cellulitis. No drainage.   Psychiatric: He has a normal mood and affect. His behavior is normal. Judgment and thought content normal.   BP (!) 114/53   Pulse 94   Resp 16   Past Medical History:  Diagnosis Date  . BPH (benign prostatic hyperplasia)   . Constipation   . COPD (chronic obstructive pulmonary disease) ([place])   . Diabetes mellitus without complication ([place])   . GERD (gastroesophageal reflux disease)   . HTN (hypertension)   . Hypothyroidism   . Stroke [facility])    Social History   Social History  . Marital status: Married    Spouse name: N/A  . Number of children: N/A  . Years of education: N/A   Occupational History  . Not on file.   Social History Main Topics  . Smoking status: Former Research scientist (life sciences)  . Smokeless tobacco: Never Used  . Alcohol use No  . Drug use: No  . Sexual activity: Not on file   Other Topics Concern  . Not on file   Social History Narrative  . No narrative on file   Past Surgical History:  Procedure Laterality Date  . AMPUTATION TOE Right 01/28/2016  Procedure: AMPUTATION TOE;  Surgeon: Sharlotte Alamo, DPM;  Location: [facility] ORS;  Service: Podiatry;  Laterality: Right;  . IRRIGATION AND DEBRIDEMENT FOOT Right 01/28/2016   Procedure: IRRIGATION AND DEBRIDEMENT FOOT;  Surgeon: Sharlotte Alamo, DPM;  Location: [facility] OR[facility];  Service: Podiatry;  Laterality: Right;  . PERIPHERAL VASCULAR CATHETERIZATION Right 01/10/2016   Procedure: Lower Extremity Angiography;  Surgeon: Katha Cabal, MD;  Location: [place] [facility];  Service: Cardiovascular;  Laterality: Right;  . PERIPHERAL VASCULAR CATHETERIZATION N/A 01/10/2016   Procedure: Abdominal Aortogram w/Lower Extremity;  Surgeon: Katha Cabal, MD;  Location: [place] [facility];  Service: Cardiovascular;  Laterality: N/A;  . PERIPHERAL VASCULAR CATHETERIZATION  01/10/2016   Procedure: Lower Extremity Intervention;  Surgeon: Katha Cabal, MD;  Location: [place] [facility];  Service: Cardiovascular;;  . ROTATOR CUFF REPAIR Left   . THYROID SURGERY     Family History  Problem Relation Age of Onset  . Diabetes Mother   . Thyroid disease Mother   . Pancreatic cancer Father    No Known Allergies     Assessment & Plan:  Patient presents at request of Dr. Vickki Muff for a right lower extremity non-healing ulceration (foot). Patient underwent partial first ray amputation in early November 2017 for exposed bone. Since then he has had worsening wound / ulcer formation on the skin flap covering the first metatarsal. He also has necrosis of the fifth metatarsal and right heel. These areas are not showing any signs of healing. He currently resides in a nursing facility. Last endovascular intervention was on 01/10/16 (RLE angiogram). Patient did not follow up after intervention. He denies any fever, nausea or vomiting. No cellulitis to foot. No drainage noted.   1. 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 Multiple non-healing ulcerations on right foot despite wound care from podiatry. High risk for amputation. Recommend RLE angiogram with intervention to revascularize the extremity. Procedure, risk and benefits explained to patient. All questions answered. Patient wishes to proceed.   2. PAD (peripheral artery disease) ([facility]) - Worsening Continue ASA and statin for medical optimization.   3. Type 2 diabetes mellitus with complication, unspecified long term insulin use status ([place]) - Stable Encouraged good control as its slows the progression of atherosclerotic disease   4. Hyperlipidemia, unspecified hyperlipidemia type - Stable Encouraged good control  as its slows the progression of atherosclerotic disease  Current Outpatient Prescriptions on File Prior to Visit  Medication Sig Dispense Refill  . albuterol (PROVENTIL HFA;VENTOLIN HFA) 108 (90 Base) MCG/ACT inhaler Inhale 2 puffs into the lungs.     . Amino Acids-Protein Hydrolys (FEEDING SUPPLEMENT, PRO-STAT SUGAR FREE 64,) LIQD Take 30 mLs by mouth 3 (three) times daily with meals.    Marland Kitchen aspirin 81 MG tablet Take 81 mg by mouth daily.    . blood glucose meter kit and supplies KIT Dispense based on patient and insurance preference. Use up to four times daily as directed. (FOR ICD-9 250.00, 250.01). 1 each 0  . budesonide-formoterol (SYMBICORT) 160-4.5 MCG/ACT inhaler Inhale 2 puffs into the lungs 2 (two) times daily.    . clopidogrel (PLAVIX) 75 MG tablet Take 1 tablet (75 mg total) by mouth daily. 30 tablet 5  . collagenase (SANTYL) ointment Apply topically 2 days. 15 g 0  . docusate sodium (COLACE) 100 MG capsule Take 200 mg by mouth 2 (two) times daily.    . feeding  supplement, GLUCERNA SHAKE, (GLUCERNA SHAKE) LIQD Take 237 mLs by mouth 3 (three) times daily between meals. 30 Can 0  . FLUoxetine (PROZAC) 40 MG capsule Take 40 mg by mouth daily.    . furosemide (LASIX) 20 MG tablet Take 1 tablet (20 mg total) by mouth 2 (two) times daily. 60 tablet 1  . insulin aspart (NOVOLOG) 100 UNIT/ML injection Inject 10 Units into the skin 3 (three) times daily with meals. 10 mL 11  . insulin lispro (HUMALOG) 100 UNIT/ML injection Inject 20 Units into the skin. 0-150=0 151-200=2unit 201-250=4units 251-300=6 units 301-350=8 units > 351= 10 units & recheck in 2 hours    . LANTUS SOLOSTAR 100 UNIT/ML Solostar Pen Inject 50 Units into the skin daily. subQ at 1630 with dinner 15 mL 11  . midodrine (PROAMATINE) 5 MG tablet Take 1 tablet (5 mg total) by mouth 3 (three) times daily with meals. 90 tablet 0  . nitroGLYCERIN (NITROSTAT) 0.4 MG SL tablet Place 1 tablet (0.4 mg total) under the tongue every 5  (five) minutes as needed for chest pain. 20 tablet 1  . omeprazole (PRILOSEC) 20 MG capsule Take 1 capsule (20 mg total) by mouth 2 (two) times daily. 30 capsule 0  . senna (SENOKOT) 8.6 MG TABS tablet Take 2 tablets by mouth 2 (two) times daily.    . simvastatin (ZOCOR) 20 MG tablet Take 20 mg by mouth at bedtime.    . sodium chloride 0.9 % infusion Inject 250 mLs into the vein as needed (for IV line care(Saline / Heparin Lock)). 10 mL 0  . SYNTHROID 125 MCG tablet Take 125 mcg by mouth daily.    . tamsulosin (FLOMAX) 0.4 MG CAPS capsule Take 0.4 mg by mouth daily.    . traMADol (ULTRAM) 50 MG tablet Take 50 mg by mouth 4 (four) times daily as needed.    Marland Kitchen oxyCODONE (OXY IR/ROXICODONE) 5 MG immediate release tablet Take 1 tablet (5 mg total) by mouth every 4 (four) hours as needed for moderate pain. (Patient not taking: Reported on 03/07/2016) 30 tablet 0   No current facility-administered medications on file prior to visit.     There are no Patient Instructions on file for this visit. No Follow-up on file.   Vadim Centola A Kathyann Spaugh, PA-C

## 2016-03-08 ENCOUNTER — Encounter
Admission: RE | Admit: 2016-03-08 | Discharge: 2016-03-08 | Disposition: A | Discharge status: Home / Self Care | Payer: Medicare Other | Source: Ambulatory Visit | Attending: Vascular Surgery | Admitting: Vascular Surgery

## 2016-03-08 DIAGNOSIS — J449 Chronic obstructive pulmonary disease, unspecified: Secondary | ICD-10-CM | POA: Diagnosis not present

## 2016-03-08 DIAGNOSIS — I70238 Atherosclerosis of native arteries of right leg with ulceration of other part of lower right leg: Secondary | ICD-10-CM | POA: Diagnosis not present

## 2016-03-08 DIAGNOSIS — E039 Hypothyroidism, unspecified: Secondary | ICD-10-CM | POA: Diagnosis not present

## 2016-03-08 DIAGNOSIS — Z89421 Acquired absence of other right toe(s): Secondary | ICD-10-CM | POA: Diagnosis not present

## 2016-03-08 DIAGNOSIS — I10 Essential (primary) hypertension: Secondary | ICD-10-CM | POA: Diagnosis not present

## 2016-03-08 DIAGNOSIS — Z833 Family history of diabetes mellitus: Secondary | ICD-10-CM | POA: Diagnosis not present

## 2016-03-08 DIAGNOSIS — L97919 Non-pressure chronic ulcer of unspecified part of right lower leg with unspecified severity: Secondary | ICD-10-CM | POA: Diagnosis present

## 2016-03-08 DIAGNOSIS — I70249 Atherosclerosis of native arteries of left leg with ulceration of unspecified site: Secondary | ICD-10-CM | POA: Diagnosis not present

## 2016-03-08 DIAGNOSIS — L97819 Non-pressure chronic ulcer of other part of right lower leg with unspecified severity: Secondary | ICD-10-CM | POA: Diagnosis not present

## 2016-03-08 DIAGNOSIS — K219 Gastro-esophageal reflux disease without esophagitis: Secondary | ICD-10-CM | POA: Diagnosis not present

## 2016-03-08 DIAGNOSIS — N400 Enlarged prostate without lower urinary tract symptoms: Secondary | ICD-10-CM | POA: Diagnosis not present

## 2016-03-08 DIAGNOSIS — Z8673 Personal history of transient ischemic attack (TIA), and cerebral infarction without residual deficits: Secondary | ICD-10-CM | POA: Diagnosis not present

## 2016-03-08 DIAGNOSIS — L97929 Non-pressure chronic ulcer of unspecified part of left lower leg with unspecified severity: Secondary | ICD-10-CM | POA: Diagnosis not present

## 2016-03-08 HISTORY — DX: Parkinson's disease without dyskinesia, without mention of fluctuations: G20.A1

## 2016-03-08 HISTORY — DX: Major depressive disorder, single episode, unspecified: F32.9

## 2016-03-08 HISTORY — DX: Parkinson's disease: G20

## 2016-03-08 HISTORY — DX: Peripheral vascular disease, unspecified: I73.9

## 2016-03-08 HISTORY — DX: Chronic kidney disease, unspecified: N18.9

## 2016-03-08 HISTORY — DX: Dependence on supplemental oxygen: Z99.81

## 2016-03-08 HISTORY — DX: Anxiety disorder, unspecified: F41.9

## 2016-03-08 HISTORY — DX: Dizziness and giddiness: R42

## 2016-03-08 HISTORY — DX: Depression, unspecified: F32.A

## 2016-03-08 HISTORY — DX: Unspecified osteoarthritis, unspecified site: M19.90

## 2016-03-08 HISTORY — DX: Osteomyelitis, unspecified: M86.9

## 2016-03-08 HISTORY — DX: Heart failure, unspecified: I50.9

## 2016-03-08 HISTORY — DX: Acute myocardial infarction, unspecified: I21.9

## 2016-03-08 LAB — BUN: BUN: 32 mg/dL — ABNORMAL HIGH (ref 6–20)

## 2016-03-08 LAB — CREATININE, SERUM
Creatinine, Ser: 1.18 mg/dL (ref 0.61–1.24)
GFR calc Af Amer: >60 mL/min (ref >60)
GFR calc non Af Amer: 56 mL/min — ABNORMAL LOW (ref >60)

## 2016-03-09 ENCOUNTER — Encounter
Admission: RE | Disposition: A | Discharge status: Home / Self Care | Payer: Self-pay | Source: Ambulatory Visit | Attending: Vascular Surgery

## 2016-03-09 ENCOUNTER — Ambulatory Visit
Admission: RE | Admit: 2016-03-09 | Discharge: 2016-03-09 | Disposition: A | Discharge status: Home / Self Care | Payer: Medicare Other | Source: Ambulatory Visit | Attending: Vascular Surgery | Admitting: Vascular Surgery

## 2016-03-09 DIAGNOSIS — I70238 Atherosclerosis of native arteries of right leg with ulceration of other part of lower right leg: Secondary | ICD-10-CM | POA: Diagnosis not present

## 2016-03-09 DIAGNOSIS — N400 Enlarged prostate without lower urinary tract symptoms: Secondary | ICD-10-CM | POA: Insufficient documentation

## 2016-03-09 DIAGNOSIS — L97819 Non-pressure chronic ulcer of other part of right lower leg with unspecified severity: Secondary | ICD-10-CM | POA: Insufficient documentation

## 2016-03-09 DIAGNOSIS — I10 Essential (primary) hypertension: Secondary | ICD-10-CM | POA: Insufficient documentation

## 2016-03-09 DIAGNOSIS — I70249 Atherosclerosis of native arteries of left leg with ulceration of unspecified site: Secondary | ICD-10-CM | POA: Diagnosis not present

## 2016-03-09 DIAGNOSIS — Z8673 Personal history of transient ischemic attack (TIA), and cerebral infarction without residual deficits: Secondary | ICD-10-CM | POA: Insufficient documentation

## 2016-03-09 DIAGNOSIS — Z89421 Acquired absence of other right toe(s): Secondary | ICD-10-CM | POA: Insufficient documentation

## 2016-03-09 DIAGNOSIS — J449 Chronic obstructive pulmonary disease, unspecified: Secondary | ICD-10-CM | POA: Insufficient documentation

## 2016-03-09 DIAGNOSIS — K219 Gastro-esophageal reflux disease without esophagitis: Secondary | ICD-10-CM | POA: Insufficient documentation

## 2016-03-09 DIAGNOSIS — L97929 Non-pressure chronic ulcer of unspecified part of left lower leg with unspecified severity: Secondary | ICD-10-CM | POA: Insufficient documentation

## 2016-03-09 DIAGNOSIS — Z833 Family history of diabetes mellitus: Secondary | ICD-10-CM | POA: Insufficient documentation

## 2016-03-09 DIAGNOSIS — E039 Hypothyroidism, unspecified: Secondary | ICD-10-CM | POA: Insufficient documentation

## 2016-03-09 HISTORY — PX: PERIPHERAL VASCULAR CATHETERIZATION: SHX172C

## 2016-03-09 SURGERY — LOWER EXTREMITY ANGIOGRAPHY
Anesthesia: Moderate Sedation | Laterality: Right

## 2016-03-09 MED ORDER — FAMOTIDINE 20 MG PO TABS: 40.0000 mg | ORAL_TABLET | ORAL | Status: DC | PRN

## 2016-03-09 MED ORDER — OXYCODONE HCL 5 MG PO TABS: 5.0000 mg | ORAL_TABLET | ORAL | Status: DC | PRN

## 2016-03-09 MED ORDER — MIDAZOLAM HCL 2 MG/2ML IJ SOLN
INTRAMUSCULAR | Status: DC | PRN
  Administered 2016-03-09 (×2): 0.5 mg via INTRAVENOUS
  Administered 2016-03-09: 2 mg via INTRAVENOUS
  Administered 2016-03-09: 0.5 mg via INTRAVENOUS

## 2016-03-09 MED ORDER — HEPARIN SODIUM (PORCINE) 1000 UNIT/ML IJ SOLN
INTRAMUSCULAR | Status: DC | PRN
  Administered 2016-03-09: 4000 [IU] via INTRAVENOUS

## 2016-03-09 MED ORDER — CEFAZOLIN IN D5W 1 GM/50ML IV SOLN
1.0000 g | Freq: Once | INTRAVENOUS | Status: AC
  Administered 2016-03-09: 1 g via INTRAVENOUS

## 2016-03-09 MED ORDER — HYDROMORPHONE HCL 1 MG/ML IJ SOLN: 1.0000 mg | Freq: Once | INTRAMUSCULAR | Status: DC

## 2016-03-09 MED ORDER — MIDAZOLAM HCL 5 MG/5ML IJ SOLN
INTRAMUSCULAR | Status: AC
  Filled 2016-03-09: qty 5

## 2016-03-09 MED ORDER — ACETAMINOPHEN 325 MG PO TABS: 325.0000 mg | ORAL_TABLET | ORAL | Status: DC | PRN

## 2016-03-09 MED ORDER — DOCUSATE SODIUM 100 MG PO CAPS: 100.0000 mg | ORAL_CAPSULE | Freq: Every day | ORAL | Status: DC

## 2016-03-09 MED ORDER — LIDOCAINE HCL (PF) 1 % IJ SOLN
INTRAMUSCULAR | Status: AC
  Filled 2016-03-09: qty 30

## 2016-03-09 MED ORDER — PANTOPRAZOLE SODIUM 40 MG PO TBEC: 40.0000 mg | DELAYED_RELEASE_TABLET | Freq: Every day | ORAL | Status: DC

## 2016-03-09 MED ORDER — HEPARIN SODIUM (PORCINE) 1000 UNIT/ML IJ SOLN
INTRAMUSCULAR | Status: AC
  Filled 2016-03-09: qty 1

## 2016-03-09 MED ORDER — HYDRALAZINE HCL 20 MG/ML IJ SOLN: 5.0000 mg | INTRAMUSCULAR | Status: DC | PRN

## 2016-03-09 MED ORDER — SODIUM CHLORIDE 0.9 % IV SOLN: INTRAVENOUS | Status: DC

## 2016-03-09 MED ORDER — ACETAMINOPHEN 325 MG RE SUPP
325.0000 mg | RECTAL | Status: DC | PRN
  Filled 2016-03-09: qty 2

## 2016-03-09 MED ORDER — IOPAMIDOL (ISOVUE-300) INJECTION 61%
INTRAVENOUS | Status: DC | PRN
  Administered 2016-03-09: 75 mL via INTRAVENOUS

## 2016-03-09 MED ORDER — METHYLPREDNISOLONE SODIUM SUCC 125 MG IJ SOLR: 125.0000 mg | INTRAMUSCULAR | Status: DC | PRN

## 2016-03-09 MED ORDER — HEPARIN (PORCINE) IN NACL 2-0.9 UNIT/ML-% IJ SOLN
INTRAMUSCULAR | Status: AC
  Filled 2016-03-09: qty 1000

## 2016-03-09 MED ORDER — CEFUROXIME SODIUM 1.5 G IJ SOLR: 1.5000 g | INTRAMUSCULAR | Status: DC

## 2016-03-09 MED ORDER — LABETALOL HCL 5 MG/ML IV SOLN: 10.0000 mg | INTRAVENOUS | Status: DC | PRN

## 2016-03-09 MED ORDER — ONDANSETRON HCL 4 MG/2ML IJ SOLN: 4.0000 mg | Freq: Four times a day (QID) | INTRAMUSCULAR | Status: DC | PRN

## 2016-03-09 MED ORDER — METOPROLOL TARTRATE 5 MG/5ML IV SOLN: 5.0000 mg | Freq: Four times a day (QID) | INTRAVENOUS | Status: DC

## 2016-03-09 MED ORDER — MORPHINE SULFATE (PF) 4 MG/ML IV SOLN: 2.0000 mg | INTRAVENOUS | Status: DC | PRN

## 2016-03-09 MED ORDER — FENTANYL CITRATE (PF) 100 MCG/2ML IJ SOLN
INTRAMUSCULAR | Status: DC | PRN
  Administered 2016-03-09: 12.5 ug via INTRAVENOUS
  Administered 2016-03-09: 25 ug via INTRAVENOUS
  Administered 2016-03-09: 50 ug via INTRAVENOUS
  Administered 2016-03-09: 12.5 ug via INTRAVENOUS

## 2016-03-09 MED ORDER — ALUM & MAG HYDROXIDE-SIMETH 200-200-20 MG/5ML PO SUSP: 15.0000 mL | ORAL | Status: DC | PRN

## 2016-03-09 MED ORDER — FENTANYL CITRATE (PF) 100 MCG/2ML IJ SOLN
INTRAMUSCULAR | Status: AC
  Filled 2016-03-09: qty 4

## 2016-03-09 SURGICAL SUPPLY — 24 items
BALLN ULTRVRSE 2X150X150 (BALLOONS) ×2
BALLN ULTRVRSE 2X150X150 OTW (BALLOONS) ×3
BALLN ULTRVRSE 2X300X150 (BALLOONS) ×2
BALLN ULTRVRSE 2X300X150 OTW (BALLOONS) ×3
BALLOON ULTRVRSE 2X150X150 OTW (BALLOONS) ×3 IMPLANT
BALLOON ULTRVRSE 2X300X150 OTW (BALLOONS) ×3 IMPLANT
CATH CXI SUPP ST 2.6FR 150CM (CATHETERS) ×5 IMPLANT
CATH GWIRE MARINER STRGHT 4FR (CATHETERS) ×5 IMPLANT
CATH PIG 70CM (CATHETERS) ×5 IMPLANT
DEVICE PRESTO INFLATION (MISCELLANEOUS) ×5 IMPLANT
DEVICE STARCLOSE SE CLOSURE (Vascular Products) ×5 IMPLANT
DEVICE TORQUE (MISCELLANEOUS) ×5 IMPLANT
GLIDEWIRE ANGLED SS 035X260CM (WIRE) ×5 IMPLANT
GUIDEWIRE PFTE-COATED .018X300 (WIRE) ×5 IMPLANT
NEEDLE ENTRY 21GA 7CM ECHOTIP (NEEDLE) ×5 IMPLANT
PACK ANGIOGRAPHY (CUSTOM PROCEDURE TRAY) ×5 IMPLANT
SET INTRO CAPELLA COAXIAL (SET/KITS/TRAYS/PACK) ×5 IMPLANT
SHEATH BRITE TIP 5FRX11 (SHEATH) ×5 IMPLANT
SHEATH RAABE 6FR (SHEATH) ×5 IMPLANT
SHIELD RADPAD SCOOP 12X17 (MISCELLANEOUS) ×5 IMPLANT
SYR MEDRAD MARK V 150ML (SYRINGE) ×5 IMPLANT
TUBING CONTRAST HIGH PRESS 72 (TUBING) ×5 IMPLANT
WIRE G V18X300CM (WIRE) ×5 IMPLANT
WIRE J 3MM .035X145CM (WIRE) ×5 IMPLANT

## 2016-03-09 NOTE — Op Note (Signed)
[facility] Percutaneous Study/Intervention Procedural Note   Date of Surgery: 03/09/2016  Surgeon: Hortencia Pilar  Pre-operative Diagnosis: Atherosclerotic occlusive disease bilateral lower extremities with ulceration and nonhealing wounds of the right lower extremity  Post-operative diagnosis: Same  Procedure(s) Performed: 1. Introduction catheter into right lower extremity 3rd order catheter placement  2. Additional third order catheter placement  3. Percutaneous transluminal angioplasty right posterior tibial artery to 2 mm 4. Percutaneous transluminal angioplasty right peroneal artery to 2 mm  5. Star close closure left common femoral arteriotomy  Anesthesia: Conscious sedation was administered under my direct supervision. IV Versed plus fentanyl were utilized. Continuous ECG, pulse oximetry and blood pressure was monitored throughout the entire procedure.  Conscious sedation was for a total of 1 hour 17 minutes.  Sheath: 6 French Rabi left common femoral artery  Contrast: 75 cc  Fluoroscopy Time: 13.5 minutes  Indications: Alphia Kava presents with increasing pain and ulceration of the right lower extremity. This suggests the patient is having limb threatening ischemia. The risks and benefits are reviewed all questions answered patient agrees to proceed.  Procedure:Tammie L Tremaine is a 82 y.o. y.o. male who was identified and appropriate procedural time out was performed. The patient was then placed supine on the table and prepped and draped in the usual sterile fashion.   Ultrasound was placed in the sterile sleeve and the left groin was evaluated the left common femoral artery was echolucent and pulsatile indicating patency. Image was recorded for the permanent record and under real-time visualization a microneedle was inserted into the common femoral artery microwire followed by  a micro-sheath. A J-wire was then advanced through the micro-sheath and a 5 [place] sheath was then inserted over a J-wire. J-wire was then advanced and a 5 French pigtail catheter was positioned at the level of T12.  AP projection of the aorta was then obtained. Pigtail catheter was repositioned to above the bifurcation and a LAO view of the pelvis was obtained. Subsequently a pigtail catheter with the stiff angle Glidewire was used to cross the aortic bifurcation the catheter wire were advanced down into the right distal external iliac artery. Oblique view of the femoral bifurcation was then obtained and subsequently the wire was reintroduced and the pigtail catheter negotiated into the SFA representing third order catheter placement. Distal runoff was then performed.  4000 units of heparin was then given and allowed to circulate and a 6 [place] Rabi sheath was advanced up and over the bifurcation and positioned in the proximal superficial femoral artery  Straight catheter and stiff angle Glidewire were then negotiated down into the distal popliteal.  Hand injection of contrast demonstrated the trifurcation and proximal tibial anatomy in detail.   The angled Glidewire and straight catheter were then used to select the posterior tibial artery. Subsequently, the angled Glidewire was exchanged for a VAT 18 wire and the straight catheter exchanged for a CX I catheter and this wire and catheter combination was negotiated through the entire occlusion down to the lateral plantar artery. Hand injection contrast demonstrated intraluminal positioning. An advantage wire was then introduced and the CXI catheter removed. A 2 x 300 mm all traverse balloon was used to angioplasty the posterior tibial across its entire length. The inflation was for 2 minutes at 14 atm. Follow-up imaging demonstrated excellent patency with preservation of the distal runoff.  The detector was then repositioned and the trifurcation imaged  again. The peroneal was then reimaged and a long 80%  stenosis is noted in the midportion of the peroneal and after appropriate measurements are made a 2 x 200 all traverse balloon was used to angioplasty the peroneal artery. Inflation was to 14 atmospheres for 2 minutes. Follow-up imaging demonstrated patency with excellent result. Distal runoff was then reassessed and noted to be widely patent.   After review of these images the sheath is pulled into the left external iliac oblique of the common femoral is obtained and a Star close device deployed. There no immediate Complications.  Findings: The abdominal aorta is opacified with a bolus injection contrast. Renal arteries are single and widely patent. The aorta itself has diffuse disease but no hemodynamically significant lesions. The common and external iliac arteries are widely patent bilaterally.  The right common femoral is widely patent as is the profunda femoris and the SFA. The popliteal remains patent down to the trifurcation without hemodynamically significant stenosis. The posterior tibial does indeed have a significant stenosis at its origin which is represented is a string sign over the first 2-3 cm. Several centimeters below this level the posterior tibial artery occludes and there is no reconstitution until the level of the lateral plantar artery. The peroneal artery demonstrates an 80% stenosis throughout the middle third distally it does have a more normal luminal diameter and gives off a fairly numerous collaterals to the foot.  The anterior tibial is patent and incongruently down to the pedal arch.    Following angioplasty the posterior  tibial now is now widely patent  with in-line flow and looks quite nice. Angioplasty of the midportion of the peroneal yields an excellent result with less than 10% residual stenosis.  Summary: Successful recanalization of both the right peroneal and posterior tibial for lower extremity for limb  salvage   Disposition: Patient was taken to the recovery room in stable condition having tolerated the procedure well.  Hortencia Pilar 03/09/2016,12:17 PM

## 2016-03-09 NOTE — H&P (Signed)
[facility] History & Physical Update  The patient was interviewed and re-examined.  The patient's previous History and Physical has been reviewed and is unchanged.  There is no change in the plan of care. We plan to proceed with the scheduled procedure.  Levora DredgeGregory Maven Rosander, MD  03/09/2016, 9:43 AM

## 2016-04-02 ENCOUNTER — Other Ambulatory Visit (INDEPENDENT_AMBULATORY_CARE_PROVIDER_SITE_OTHER): Payer: Self-pay | Admitting: Vascular Surgery

## 2016-04-02 DIAGNOSIS — I739 Peripheral vascular disease, unspecified: Secondary | ICD-10-CM

## 2016-04-03 ENCOUNTER — Encounter (INDEPENDENT_AMBULATORY_CARE_PROVIDER_SITE_OTHER): Payer: Medicare Other

## 2016-04-03 ENCOUNTER — Encounter (INDEPENDENT_AMBULATORY_CARE_PROVIDER_SITE_OTHER): Payer: Medicare Other | Admitting: Vascular Surgery

## 2016-05-10 ENCOUNTER — Ambulatory Visit (INDEPENDENT_AMBULATORY_CARE_PROVIDER_SITE_OTHER): Payer: Medicare Other

## 2016-05-10 ENCOUNTER — Ambulatory Visit (INDEPENDENT_AMBULATORY_CARE_PROVIDER_SITE_OTHER): Payer: Medicare Other | Admitting: Vascular Surgery

## 2016-05-10 ENCOUNTER — Encounter (INDEPENDENT_AMBULATORY_CARE_PROVIDER_SITE_OTHER): Payer: Self-pay | Admitting: Vascular Surgery

## 2016-05-10 VITALS — BP 132/71 | HR 106 | Resp 16 | Ht 70.0 in | Wt 240.0 lb

## 2016-05-10 DIAGNOSIS — L97513 Non-pressure chronic ulcer of other part of right foot with necrosis of muscle: Secondary | ICD-10-CM

## 2016-05-10 DIAGNOSIS — E782 Mixed hyperlipidemia: Secondary | ICD-10-CM | POA: Diagnosis not present

## 2016-05-10 DIAGNOSIS — E118 Type 2 diabetes mellitus with unspecified complications: Secondary | ICD-10-CM

## 2016-05-10 DIAGNOSIS — I739 Peripheral vascular disease, unspecified: Secondary | ICD-10-CM | POA: Diagnosis not present

## 2016-05-10 DIAGNOSIS — Z794 Long term (current) use of insulin: Secondary | ICD-10-CM | POA: Diagnosis not present

## 2016-05-10 DIAGNOSIS — J449 Chronic obstructive pulmonary disease, unspecified: Secondary | ICD-10-CM | POA: Diagnosis not present

## 2016-05-10 DIAGNOSIS — I7025 Atherosclerosis of native arteries of other extremities with ulceration: Secondary | ICD-10-CM

## 2016-05-10 NOTE — Progress Notes (Signed)
MRN : [id number]  Jason [name] is a 83 y.o. (02/13/1933) male who presents with chief complaint of  Chief Complaint  Patient presents with  . Follow-up  .  History of Present Illness: The patient returns to the office for followup and review of the noninvasive studies. There has been a significant deterioration in the lower extremity symptoms.  The patient notes rest pain symptoms. Previous ulcers are not improved.  No new ulcers or wounds have occurred since the last visit.  There have been no significant changes to the patient's overall health care.  The patient denies amaurosis fugax or recent TIA symptoms. There are no recent neurological changes noted. The patient denies history of DVT, PE or superficial thrombophlebitis. The patient denies recent episodes of angina or shortness of breath.   ABI's Rt=0.61 and Lt=[place] minimal improvement after the angiogram   Current Meds  Medication Sig  . acetaminophen (TYLENOL) 325 MG tablet Take 650 mg by mouth every 4 (four) hours as needed for moderate pain, fever or headache.   . albuterol (PROVENTIL HFA;VENTOLIN HFA) 108 (90 Base) MCG/ACT inhaler Inhale 2 puffs into the lungs every 6 (six) hours as needed for wheezing or shortness of breath.   . Amino Acids-Protein Hydrolys (FEEDING SUPPLEMENT, PRO-STAT SUGAR FREE 64,) LIQD Take 30 mLs by mouth 2 (two) times daily.   Marland Kitchen antiseptic oral rinse (BIOTENE) LIQD 10 mLs by Mouth Rinse route 3 (three) times daily.  Marland Kitchen aspirin 81 MG tablet Take 81 mg by mouth daily.  . blood glucose meter kit and supplies KIT Dispense based on patient and insurance preference. Use up to four times daily as directed. (FOR ICD-9 250.00, 250.01).  . budesonide-formoterol (SYMBICORT) 160-4.5 MCG/ACT inhaler Inhale 2 puffs into the lungs 2 (two) times daily.  . clopidogrel (PLAVIX) 75 MG tablet Take 1 tablet (75 mg total) by mouth daily.  . enalapril (VASOTEC) 20 MG tablet Take 20 mg by mouth daily.  Marland Kitchen escitalopram  (LEXAPRO) 10 MG tablet Take 10 mg by mouth daily.  . furosemide (LASIX) 40 MG tablet Take 40 mg by mouth daily.  Marland Kitchen guaifenesin (ROBITUSSIN) 100 MG/5ML syrup Take 200 mg by mouth 3 (three) times daily as needed for cough.  . insulin lispro (HUMALOG) 100 UNIT/ML injection Inject 10-22 Units into the skin 3 (three) times daily before meals. Inject 10 units + SSI 0-150=0 units 151-200=2unit 201-250=4units 251-300=6 units 301-350=8 units 351-400=10 units  greater than 400 = 12 units call md  . LANTUS SOLOSTAR 100 UNIT/ML Solostar Pen Inject 50 Units into the skin daily. subQ at 1630 with dinner (Patient taking differently: Inject 37 Units into the skin every morning. )  . Loratadine (CLARITIN) 10 MG CAPS Take by mouth daily.  . magnesium hydroxide (MILK OF MAGNESIA) 400 MG/5ML suspension Take by mouth daily as needed for mild constipation.  . OXYGEN Inhale into the lungs as needed.  . polyethylene glycol (MIRALAX / GLYCOLAX) packet Take 17 g by mouth daily.  . sennosides-docusate sodium (SENOKOT-S) 8.6-50 MG tablet Take 1 tablet by mouth 2 (two) times daily.  Marland Kitchen SYNTHROID 125 MCG tablet Take 125 mcg by mouth daily.  . tamsulosin (FLOMAX) 0.4 MG CAPS capsule Take 0.4 mg by mouth daily.    Past Medical History:  Diagnosis Date  . Anxiety   . Arthritis   . BPH (benign prostatic hyperplasia)   . CHF (congestive heart failure) ([place])   . Chronic kidney disease    Chronic Kidney Disease, Stage 3  .  Constipation   . COPD (chronic obstructive pulmonary disease) ([place])   . Depression   . Diabetes mellitus without complication ([place])   . GERD (gastroesophageal reflux disease)   . HTN (hypertension)   . Hypothyroidism   . Myocardial infarction    Mild, patient uncertain when happened  . On supplemental oxygen therapy   . Osteomyelitis of foot ([place])    right foot  . Parkinson disease ([place])   . Peripheral vascular disease ([place])   . Stroke ([place])   . Vertigo     Past Surgical History:    Procedure Laterality Date  . AMPUTATION TOE Right 01/28/2016   Procedure: AMPUTATION TOE;  Surgeon: Sharlotte Alamo, DPM;  Location: [facility];  Service: Podiatry;  Laterality: Right;  . IRRIGATION AND DEBRIDEMENT FOOT Right 01/28/2016   Procedure: IRRIGATION AND DEBRIDEMENT FOOT;  Surgeon: Sharlotte Alamo, DPM;  Location: [facility];  Service: Podiatry;  Laterality: Right;  . PERIPHERAL VASCULAR CATHETERIZATION Right 01/10/2016   Procedure: Lower Extremity Angiography;  Surgeon: Katha Cabal, MD;  Location: [place] [facility];  Service: Cardiovascular;  Laterality: Right;  . PERIPHERAL VASCULAR CATHETERIZATION N/A 01/10/2016   Procedure: Abdominal Aortogram w/Lower Extremity;  Surgeon: Katha Cabal, MD;  Location: [facility];  Service: Cardiovascular;  Laterality: N/A;  . PERIPHERAL VASCULAR CATHETERIZATION  01/10/2016   Procedure: Lower Extremity Intervention;  Surgeon: Katha Cabal, MD;  Location: [place] [facility];  Service: Cardiovascular;;  . PERIPHERAL VASCULAR CATHETERIZATION Right 03/09/2016   Procedure: Lower Extremity Angiography;  Surgeon: Katha Cabal, MD;  Location: [place] [facility];  Service: Cardiovascular;  Laterality: Right;  . PERIPHERAL VASCULAR CATHETERIZATION N/A 03/09/2016   Procedure: Abdominal Aortogram w/Lower Extremity;  Surgeon: Katha Cabal, MD;  Location: [facility];  Service: Cardiovascular;  Laterality: N/A;  . PERIPHERAL VASCULAR CATHETERIZATION  03/09/2016   Procedure: Lower Extremity Intervention;  Surgeon: Katha Cabal, MD;  Location: [facility];  Service: Cardiovascular;;  . ROTATOR CUFF REPAIR Left   . THYROID SURGERY      Social History Social History  Substance Use Topics  . Smoking status: Former Smoker    Types: Cigarettes  . Smokeless tobacco: Never Used  . Alcohol use No    Family History Family History  Problem Relation Age of Onset  . Diabetes Mother   . Thyroid disease Mother    . Pancreatic cancer Father   No family history of bleeding/clotting disorders, porphyria or autoimmune disease   No Known Allergies   REVIEW OF SYSTEMS (Negative unless checked)  Constitutional: [] Weight loss  [] Fever  [] Chills Cardiac: [] Chest pain   [x] Chest pressure   [] Palpitations   [x] Shortness of breath when laying flat   [x] Shortness of breath with exertion. Vascular:  [] Pain in legs with walking   [x] Pain in legs at rest  [] History of DVT   [] Phlebitis   [] Swelling in legs   [] Varicose veins   [x] Non-healing ulcers Pulmonary:   [] Uses home oxygen   [] Productive cough   [] Hemoptysis   [] Wheeze  [] COPD   [] Asthma Neurologic:  [] Dizziness   [] Seizures   [] History of stroke   [] History of TIA  [] Aphasia   [] Vissual changes   [] Weakness or numbness in arm   [x] Weakness or numbness in leg Musculoskeletal:   [] Joint swelling   [] Joint pain   [x] Low back pain Hematologic:  [] Easy bruising  [] Easy bleeding   [] Hypercoagulable state   [] Anemic Gastrointestinal:  [] Diarrhea   [] Vomiting  []   Gastroesophageal reflux/heartburn   [] Difficulty swallowing. Genitourinary:  [x] Chronic kidney disease   [] Difficult urination  [] Frequent urination   [] Blood in urine Skin:  [x] Rashes   [x] Ulcers  Psychological:  [] History of anxiety   [x]  History of major depression.  Physical Examination  Vitals:   05/10/16 1206  BP: 132/71  Pulse: (!) 106  Resp: 16  Weight: 240 lb (108.9 kg)  Height: 5' 10"  (1.778 m)   Body mass index is 34.44 kg/m. Gen: WD/WN, moderate distress debilitated in appearance in a wheel chair Head: [place]/AT, mild temporalis wasting.  Ear/Nose/Throat: Hearing grossly intact, nares w/o erythema or drainage, poor dentition Eyes: PER, EOMI, sclera nonicteric.  Neck: Supple, no masses.  No bruit or JVD.  Pulmonary:  poorair movement, crackles to auscultation bilaterally, mild use of accessory muscles.  Cardiac: irreg irreg, normal S1, S2, 2+ SE Murmurs. Vascular: ulcers noted feet  bilaterally with drainage Vessel Right Left  Radial Palpable Palpable  Ulnar Palpable Palpable  Brachial Palpable Palpable  Carotid Palpable Palpable  Femoral Trace Palpable Trace Palpable  Popliteal Not Palpable Not Palpable  PT Not Palpable Not Palpable  DP Not Palpable Not Palpable   Gastrointestinal: soft, non-distended. No guarding/no peritoneal signs.  Musculoskeletal: M/S 3/5 throughout.  + deformity or atrophy.  Neurologic: CN 2-12 intact. Pain and light touch intact in extremities.  Symmetrical.  Speech is fluent. Motor exam as listed above. Psychiatric: Judgment intact, Mood & affect appropriate for pt's clinical situation. Dermatologic: + rashes and ulcers noted.  No changes consistent with cellulitis. Lymph : No Cervical lymphadenopathy, no lichenification or skin changes of chronic lymphedema.  CBC Lab Results  Component Value Date   WBC 9.1 02/13/2016   HGB 9.6 (L) 02/13/2016   HCT 28.6 (L) 02/13/2016   MCV 84.5 02/13/2016   PLT 110 (L) 02/13/2016    BMET    Component Value Date/Time   NA 136 02/13/2016 0434   NA 141 02/28/2013 1419   K 3.7 02/13/2016 0434   K 5.0 02/28/2013 1419   CL 95 (L) 02/13/2016 0434   CL 107 02/28/2013 1419   CO2 36 (H) 02/13/2016 0434   CO2 32 02/28/2013 1419   GLUCOSE 303 (H) 02/13/2016 0434   GLUCOSE 82 02/28/2013 1419   BUN 32 (H) 03/08/2016 1204   BUN 28 (H) 02/28/2013 1419   CREATININE 1.18 03/08/2016 1204   CREATININE 1.29 02/28/2013 1419   CALCIUM 7.8 (L) 02/13/2016 0434   CALCIUM 9.3 02/28/2013 1419   GFRNONAA 56 (L) 03/08/2016 1204   GFRNONAA 53 (L) 02/28/2013 1419   GFRAA >60 03/08/2016 1204   GFRAA >60 02/28/2013 1419   CrCl cannot be calculated (Patient's most recent lab result is older than the maximum 21 days allowed.).  COAG Lab Results  Component Value Date   INR 1.02 02/01/2016    Radiology No results found.  Assessment/Plan 1. Atherosclerosis of native arteries of the extremities with ulceration  ([place])  Recommend:  The patient has evidence of severe atherosclerotic changes of both lower extremities associated with ulceration and tissue loss of the foot. He has not improved enough from his angiogram in December 2017 for proper wound healing.  This represents a limb threatening ischemia and places the patient at the risk for limb loss.  Patient should undergo angiography of the lower extremities with the hope for intervention for limb salvage. The right leg will be addressed first.  The risks and benefits as well as the alternative therapies was discussed in detail with  the patient.  All questions were answered.  Patient agrees to proceed with angiography.  The patient will follow up with me in the office after the procedure.    2. Skin ulcer of right foot with necrosis of muscle ([place]) See #1  3. Chronic obstructive pulmonary disease, unspecified COPD type ([place]) Continue pulmonary medications and aerosols as already ordered, these medications have been reviewed and there are no changes at this time.  4. Type 2 diabetes mellitus with complication, with long-term current use of insulin ([facility]) Continue hypoglycemic medications as already ordered, these medications have been reviewed and there are no changes at this time.  Hgb A1C to be monitored as already arranged by primary service  5. Mixed hyperlipidemia Continue statin as ordered and reviewed, no changes at this time  Hortencia Pilar, MD  05/10/2016 2:24 PM

## 2016-05-17 ENCOUNTER — Ambulatory Visit (INDEPENDENT_AMBULATORY_CARE_PROVIDER_SITE_OTHER): Payer: Medicare Other | Admitting: Vascular Surgery

## 2016-05-17 ENCOUNTER — Encounter (INDEPENDENT_AMBULATORY_CARE_PROVIDER_SITE_OTHER): Payer: Medicare Other

## 2016-05-17 ENCOUNTER — Encounter (INDEPENDENT_AMBULATORY_CARE_PROVIDER_SITE_OTHER): Payer: Medicare Other | Admitting: Vascular Surgery

## 2016-06-17 DEATH — deceased

## 2017-07-30 IMAGING — MR MR FOOT*R* W/O CM
5 of 6 series · 34 of 40 positions shown · non-contrast
Comparison: None.

CLINICAL DATA: Right great toe ulceration.

EXAM:
MRI OF THE RIGHT FOREFOOT WITHOUT CONTRAST
TECHNIQUE: Multiplanar, multisequence MR imaging was performed. No intravenous
contrast was administered.

[Series 5: T1 · coronal · 3.0mm · 0.94mm/px · 8 of 77 slices shown (1 of 2)]
[im 1/77]
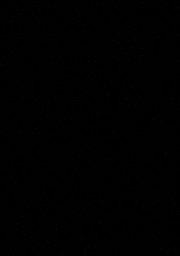
[im 9/77]
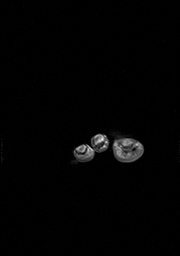
[im 26/77]
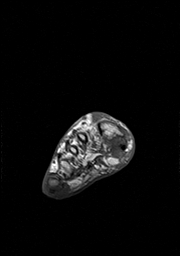
[im 34/77]
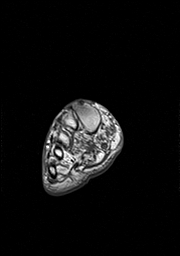
[im 43/77]
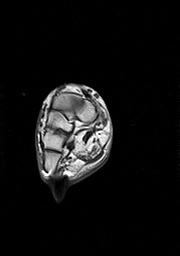
[im 51/77]
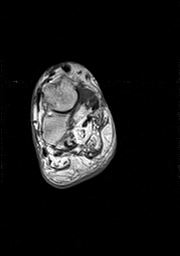
[im 68/77]
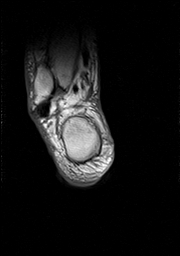
[im 77/77]
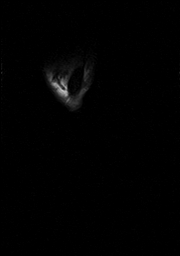

[Series 6: axial t1fs · coronal · 3.0mm · 0.94mm/px · 9 of 77 slices shown]
[im 1/77]
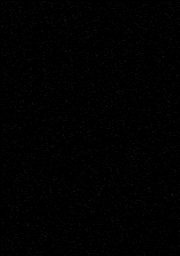
[im 10/77]
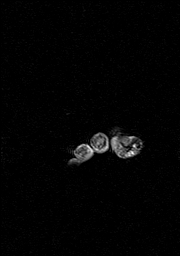
[im 20/77]
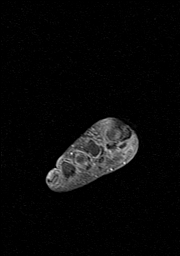
[im 29/77]
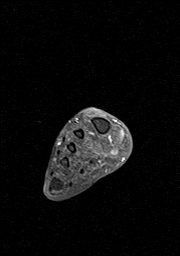
[im 39/77]
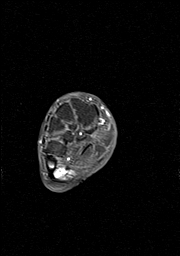
[im 48/77]
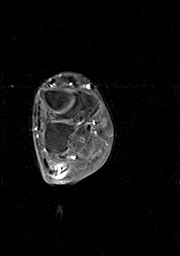
[im 58/77]
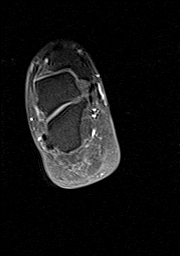
[im 67/77]
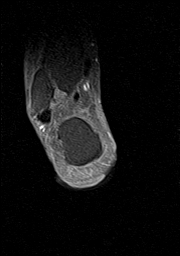
[im 77/77]
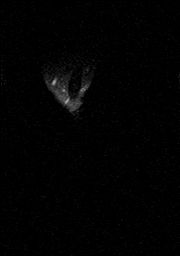

[Series 7: T2 fat-sat · coronal · 3.0mm · 0.43mm/px · 9 of 77 slices shown]
[im 1/77]
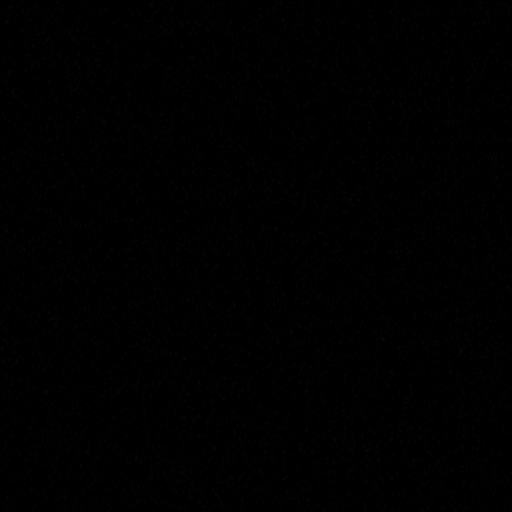
[im 10/77]
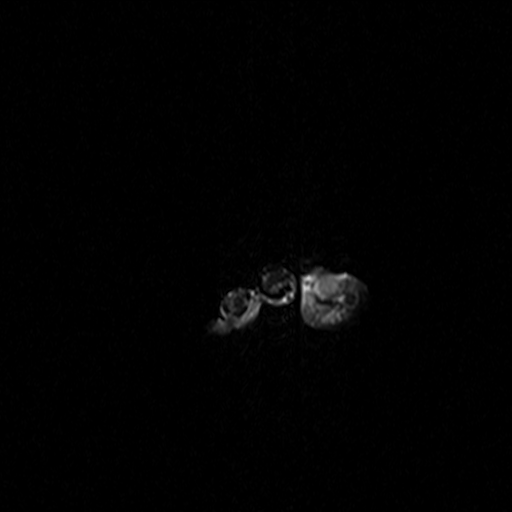
[im 20/77]
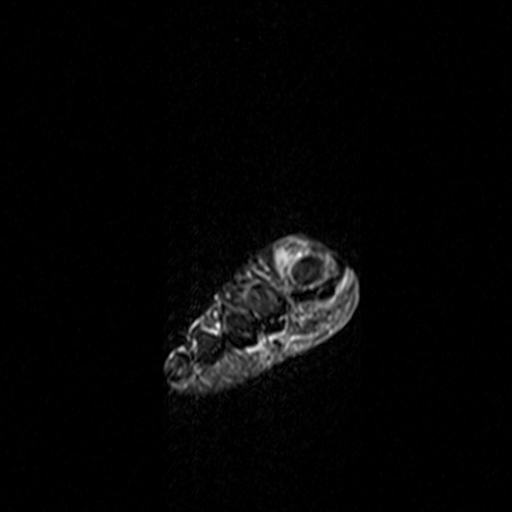
[im 29/77]
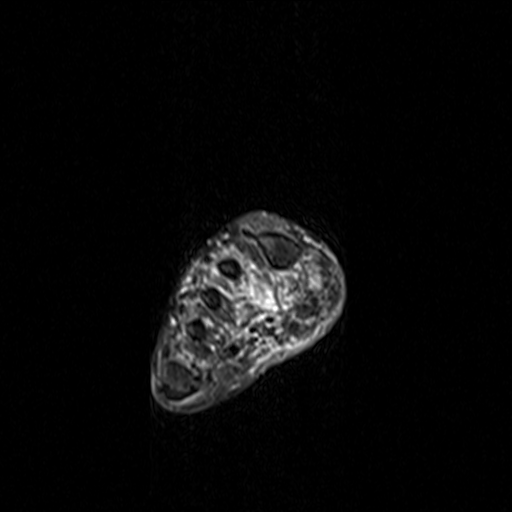
[im 39/77]
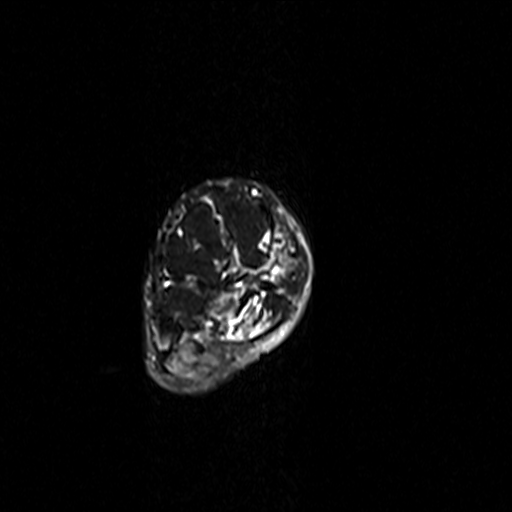
[im 48/77]
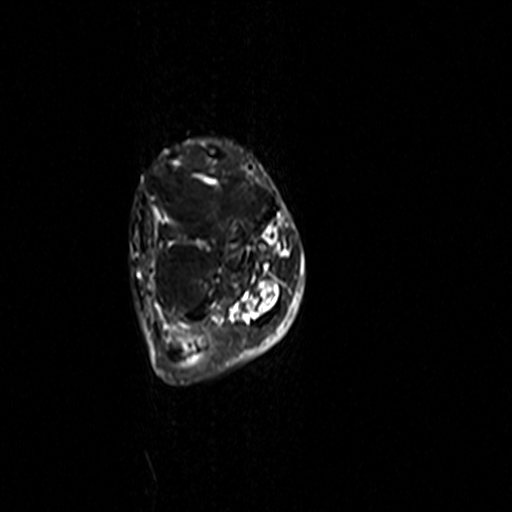
[im 58/77]
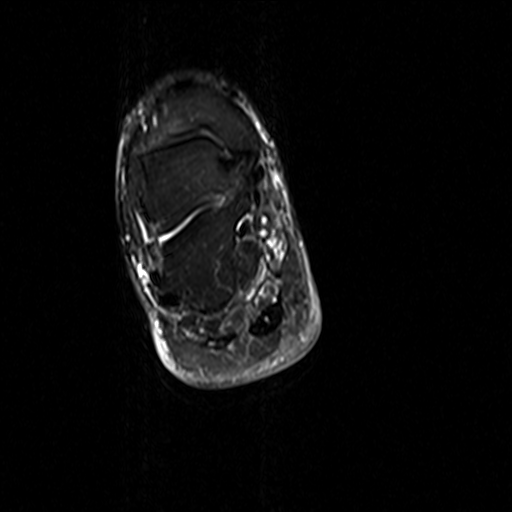
[im 67/77]
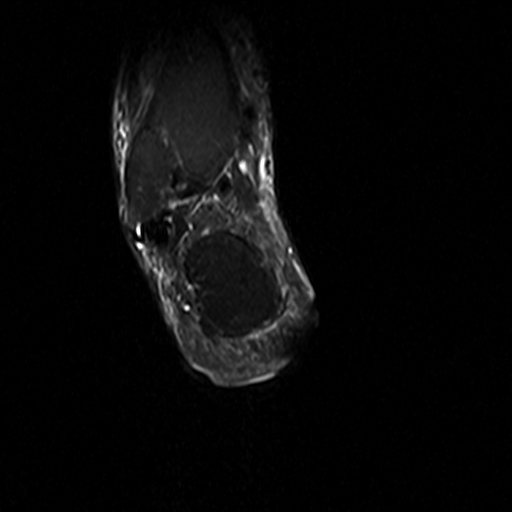
[im 77/77]
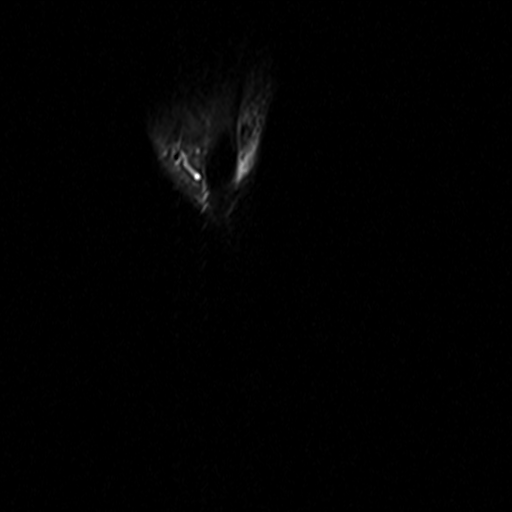

[Series 8: T1 · axial · 3.0mm · 1.02mm/px · z∈[-134,-41]mm · 4 of 35 slices shown (2 of 2)]
[im 1/35]
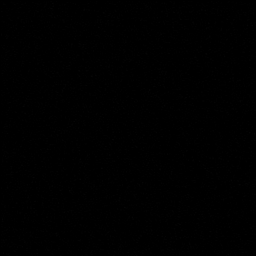
[im 12/35]
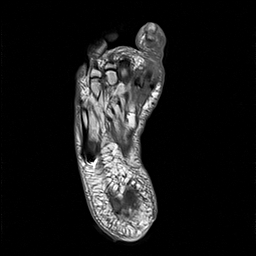
[im 23/35]
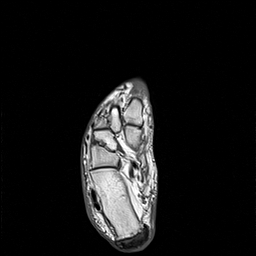
[im 35/35]
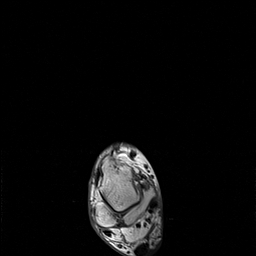

[Series 9: STIR · axial · 3.0mm · 0.51mm/px · z∈[-134,-41]mm · 4 of 35 slices shown]
[im 1/35]
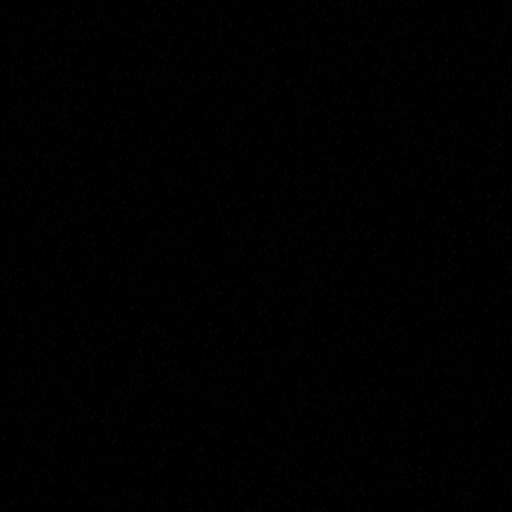
[im 12/35]
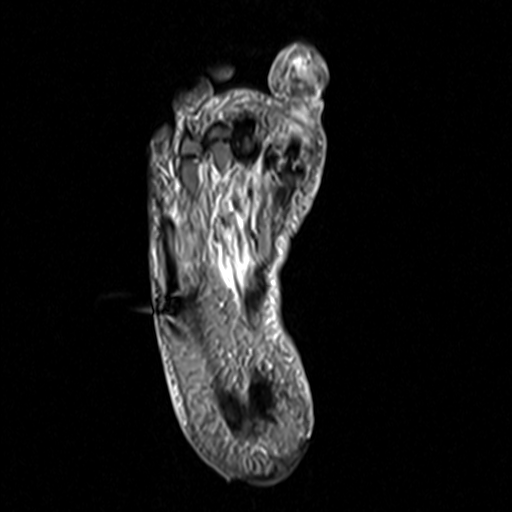
[im 23/35]
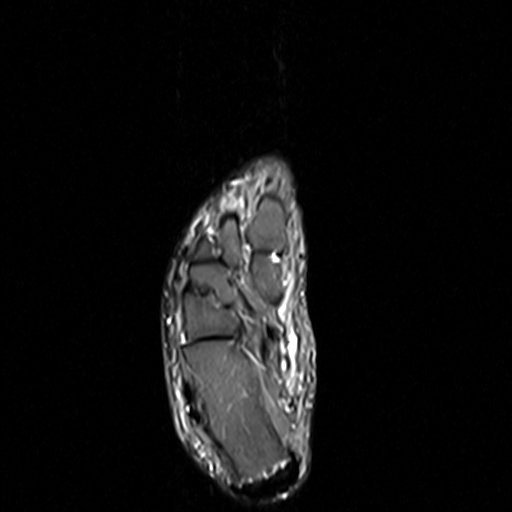
[im 35/35]
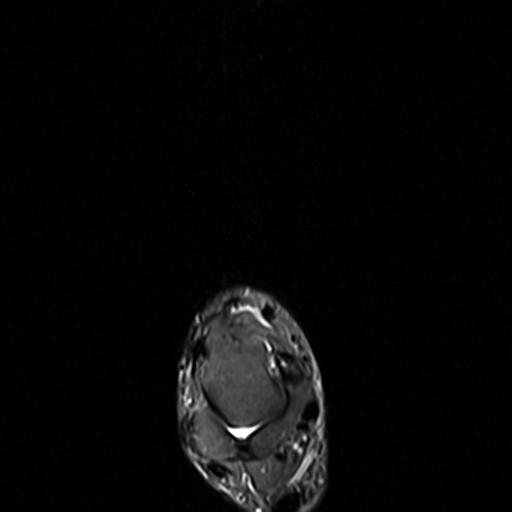

[34 of 40 positions shown; findings below may reference images not displayed]

FINDINGS: TENDONS

Peroneal: Peroneal longus tendon intact. Peroneal brevis intact.

Posteromedial: Posterior tibial tendon intact. Flexor hallucis
longus tendon intact. Flexor digitorum longus tendon intact.

Anterior: Tibialis anterior tendon intact. Extensor hallucis longus
tendon intact Extensor digitorum longus tendon intact.

Achilles:  Severe tendinosis of the distal Achilles tendon.

Plantar Fascia: Intact.

LIGAMENTS

Lateral: Anterior talofibular ligament intact. Calcaneofibular
ligament intact. Posterior talofibular ligament intact. Anterior and
posterior tibiofibular ligaments intact.

Medial: Deltoid ligament intact. Spring ligament intact.

CARTILAGE

Ankle Joint: No joint effusion. Normal ankle mortise. No chondral
defect.

Subtalar Joints/Sinus Tarsi: Normal subtalar joints. No subtalar
joint effusion. Normal sinus tarsi.

Bones: Soft tissue ulceration over the first MTP joint. Cortical
disruption of the dorsal cortex along the base of the first proximal
phalanx. Marrow edema in the first distal phalanx and first
metatarsal head without definite cortical destruction.

Soft Tissue: No fluid collection or hematoma.
IMPRESSION: 1. Soft tissue ulceration over the first MTP joint. Cortical
disruption of the dorsal cortex along the base of the first proximal
phalanx with marrow edema most concerning for osteomyelitis. Marrow
edema in the first distal phalanx and first metatarsal head without
definite cortical destruction may reflect reactive marrow edema
versus early osteomyelitis.
2. Severe tendinosis of the distal Achilles tendon.

## 2017-08-11 IMAGING — DX DG CHEST 1V
1 series · 1 of 1 positions shown · non-contrast
Comparison: [DATE] and earlier.

CLINICAL DATA: 83-year-old male with aspiration. Shortness of
breath and chest pain. Initial encounter.

EXAM:
CHEST 1 VIEW

[chest ap]
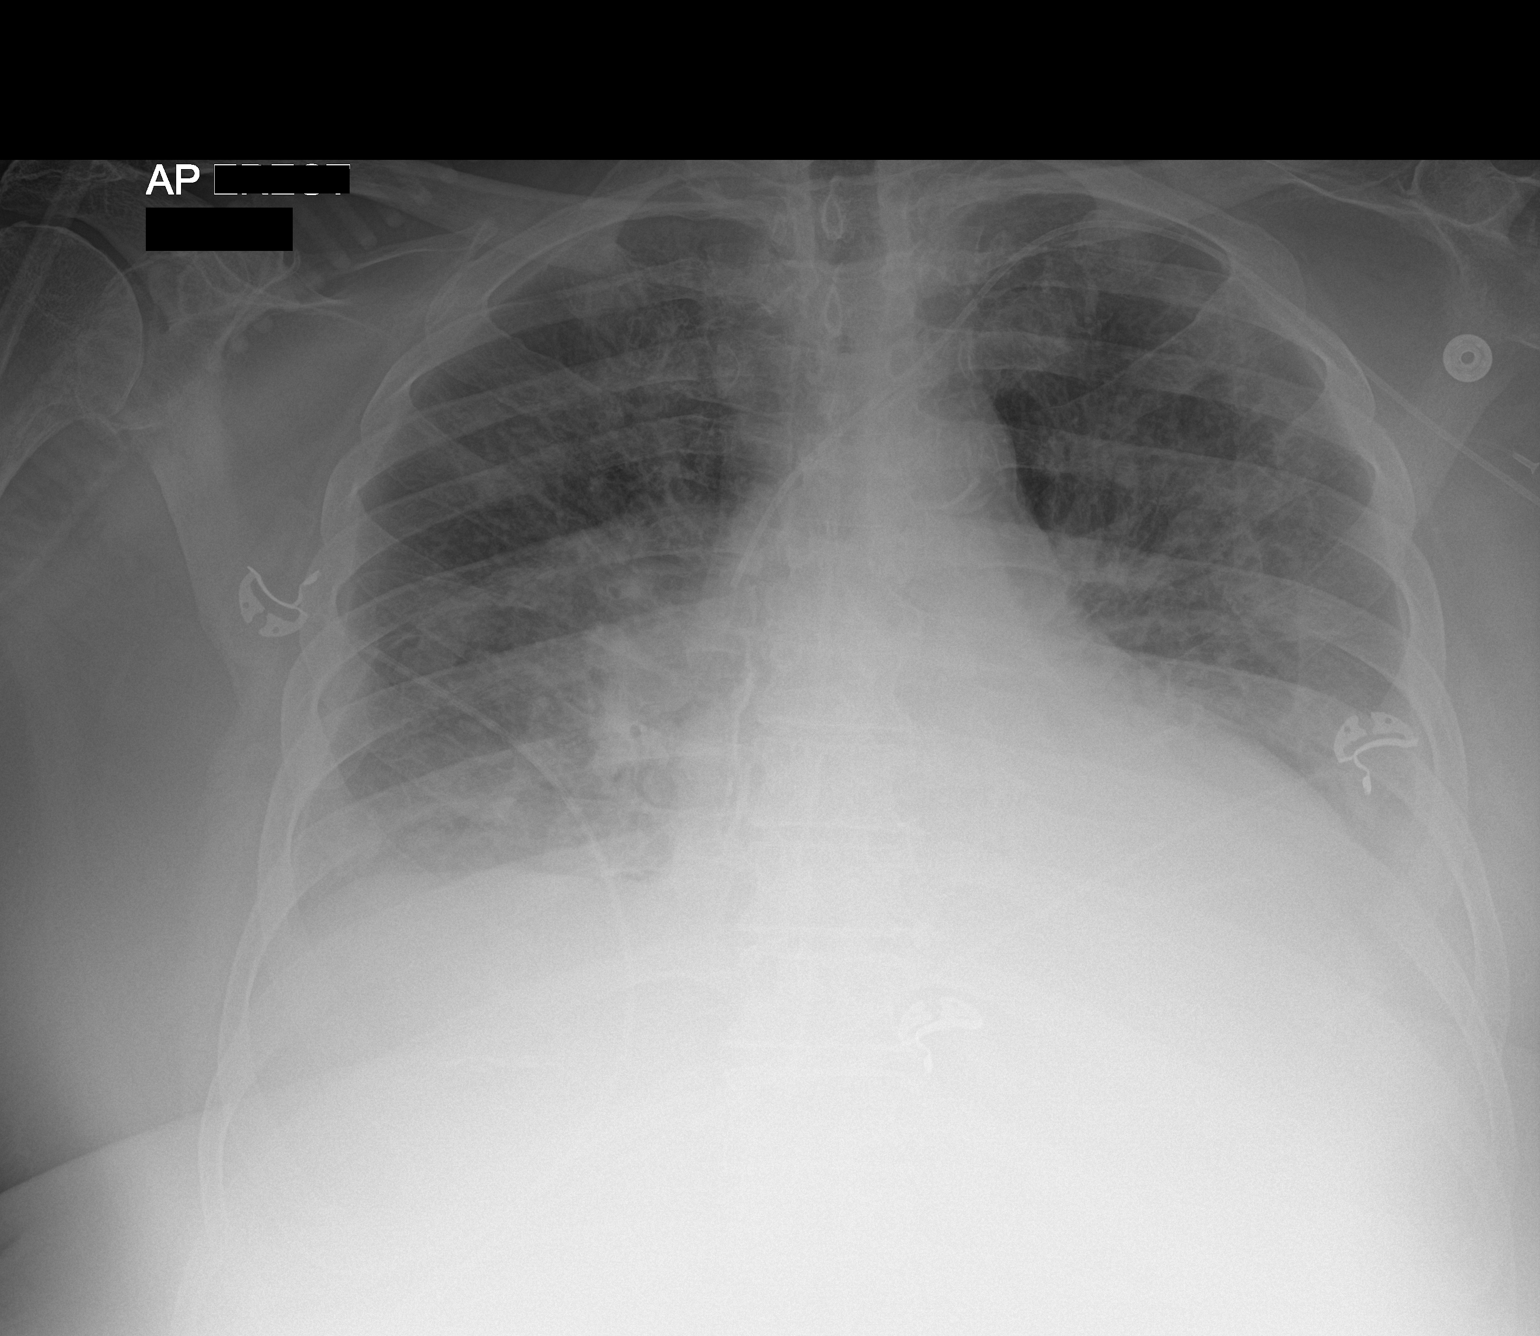

[1 of 1 positions shown; findings below may reference images not displayed]

FINDINGS: Portable AP upright view at [id number] hours. Confluent retrocardiac
opacity is not significantly changed. Superimposed veiling lung base
opacity and widespread bilateral upper lobe interstitial opacity
also appears stable. No pneumothorax. Stable left PICC line. Stable
cardiac size and mediastinal contours. Calcified aortic
atherosclerosis. Visualized tracheal air column is within normal
limits.
IMPRESSION: Stable ventilation since yesterday: Bilateral lower lobe collapse or
consolidation, superimposed widespread bilateral pulmonary
interstitial opacity, and possible small pleural effusions.
